# Patient Record
Sex: Female | Born: 1945
Health system: Southern US, Community
[De-identification: ages and names within clinical notes are randomized; demographics above are authoritative.]

## PROBLEM LIST (undated history)

## (undated) DIAGNOSIS — M1990 Unspecified osteoarthritis, unspecified site: Secondary | ICD-10-CM

## (undated) DIAGNOSIS — A059 Bacterial foodborne intoxication, unspecified: Secondary | ICD-10-CM

## (undated) DIAGNOSIS — T7840XA Allergy, unspecified, initial encounter: Secondary | ICD-10-CM

## (undated) DIAGNOSIS — E785 Hyperlipidemia, unspecified: Secondary | ICD-10-CM

## (undated) DIAGNOSIS — K635 Polyp of colon: Secondary | ICD-10-CM

## (undated) DIAGNOSIS — R011 Cardiac murmur, unspecified: Secondary | ICD-10-CM

## (undated) DIAGNOSIS — K5790 Diverticulosis of intestine, part unspecified, without perforation or abscess without bleeding: Secondary | ICD-10-CM

## (undated) DIAGNOSIS — M810 Age-related osteoporosis without current pathological fracture: Secondary | ICD-10-CM

## (undated) DIAGNOSIS — I10 Essential (primary) hypertension: Secondary | ICD-10-CM

## (undated) DIAGNOSIS — K8020 Calculus of gallbladder without cholecystitis without obstruction: Secondary | ICD-10-CM

## (undated) DIAGNOSIS — M3500 Sicca syndrome, unspecified: Secondary | ICD-10-CM

## (undated) DIAGNOSIS — K52839 Microscopic colitis, unspecified: Secondary | ICD-10-CM

## (undated) DIAGNOSIS — K219 Gastro-esophageal reflux disease without esophagitis: Secondary | ICD-10-CM

## (undated) DIAGNOSIS — G4730 Sleep apnea, unspecified: Secondary | ICD-10-CM

## (undated) DIAGNOSIS — J439 Emphysema, unspecified: Secondary | ICD-10-CM

## (undated) HISTORY — DX: Cardiac murmur, unspecified: R01.1

## (undated) HISTORY — DX: Microscopic colitis, unspecified: K52.839

## (undated) HISTORY — DX: Bacterial foodborne intoxication, unspecified: A05.9

## (undated) HISTORY — DX: Polyp of colon: K63.5

## (undated) HISTORY — DX: Gastro-esophageal reflux disease without esophagitis: K21.9

## (undated) HISTORY — PX: TONSILLECTOMY AND ADENOIDECTOMY: SUR1326

## (undated) HISTORY — DX: Hyperlipidemia, unspecified: E78.5

## (undated) HISTORY — DX: Sjogren syndrome, unspecified: M35.00

## (undated) HISTORY — DX: Unspecified osteoarthritis, unspecified site: M19.90

## (undated) HISTORY — PX: TUBAL LIGATION: SHX77

## (undated) HISTORY — DX: Diverticulosis of intestine, part unspecified, without perforation or abscess without bleeding: K57.90

## (undated) HISTORY — DX: Age-related osteoporosis without current pathological fracture: M81.0

## (undated) HISTORY — DX: Sleep apnea, unspecified: G47.30

## (undated) HISTORY — DX: Emphysema, unspecified: J43.9

## (undated) HISTORY — DX: Essential (primary) hypertension: I10

## (undated) HISTORY — DX: Allergy, unspecified, initial encounter: T78.40XA

## (undated) HISTORY — DX: Calculus of gallbladder without cholecystitis without obstruction: K80.20

---

## 2012-08-09 ENCOUNTER — Encounter: Payer: Self-pay | Admitting: Internal Medicine

## 2016-09-29 DIAGNOSIS — M06041 Rheumatoid arthritis without rheumatoid factor, right hand: Secondary | ICD-10-CM | POA: Diagnosis not present

## 2016-09-29 DIAGNOSIS — I10 Essential (primary) hypertension: Secondary | ICD-10-CM | POA: Diagnosis not present

## 2016-09-29 DIAGNOSIS — R0982 Postnasal drip: Secondary | ICD-10-CM | POA: Diagnosis not present

## 2016-09-29 DIAGNOSIS — R05 Cough: Secondary | ICD-10-CM | POA: Diagnosis not present

## 2016-09-29 DIAGNOSIS — K52831 Collagenous colitis: Secondary | ICD-10-CM | POA: Diagnosis not present

## 2016-09-29 DIAGNOSIS — M06042 Rheumatoid arthritis without rheumatoid factor, left hand: Secondary | ICD-10-CM | POA: Diagnosis not present

## 2016-10-10 DIAGNOSIS — R05 Cough: Secondary | ICD-10-CM | POA: Diagnosis not present

## 2016-10-10 DIAGNOSIS — G4733 Obstructive sleep apnea (adult) (pediatric): Secondary | ICD-10-CM | POA: Diagnosis not present

## 2016-10-10 DIAGNOSIS — F17201 Nicotine dependence, unspecified, in remission: Secondary | ICD-10-CM | POA: Diagnosis not present

## 2016-11-03 DIAGNOSIS — M06041 Rheumatoid arthritis without rheumatoid factor, right hand: Secondary | ICD-10-CM | POA: Diagnosis not present

## 2016-11-03 DIAGNOSIS — M06042 Rheumatoid arthritis without rheumatoid factor, left hand: Secondary | ICD-10-CM | POA: Diagnosis not present

## 2016-12-02 DIAGNOSIS — K52831 Collagenous colitis: Secondary | ICD-10-CM | POA: Diagnosis not present

## 2016-12-02 DIAGNOSIS — R911 Solitary pulmonary nodule: Secondary | ICD-10-CM | POA: Diagnosis not present

## 2016-12-02 DIAGNOSIS — I10 Essential (primary) hypertension: Secondary | ICD-10-CM | POA: Diagnosis not present

## 2016-12-02 DIAGNOSIS — R928 Other abnormal and inconclusive findings on diagnostic imaging of breast: Secondary | ICD-10-CM | POA: Diagnosis not present

## 2016-12-02 DIAGNOSIS — K760 Fatty (change of) liver, not elsewhere classified: Secondary | ICD-10-CM | POA: Diagnosis not present

## 2016-12-29 DIAGNOSIS — R499 Unspecified voice and resonance disorder: Secondary | ICD-10-CM | POA: Diagnosis not present

## 2017-01-02 DIAGNOSIS — M06041 Rheumatoid arthritis without rheumatoid factor, right hand: Secondary | ICD-10-CM | POA: Diagnosis not present

## 2017-01-02 DIAGNOSIS — M06042 Rheumatoid arthritis without rheumatoid factor, left hand: Secondary | ICD-10-CM | POA: Diagnosis not present

## 2017-01-05 DIAGNOSIS — R911 Solitary pulmonary nodule: Secondary | ICD-10-CM | POA: Diagnosis not present

## 2017-02-05 DIAGNOSIS — R928 Other abnormal and inconclusive findings on diagnostic imaging of breast: Secondary | ICD-10-CM | POA: Diagnosis not present

## 2017-03-23 DIAGNOSIS — M7061 Trochanteric bursitis, right hip: Secondary | ICD-10-CM | POA: Diagnosis not present

## 2017-04-27 DIAGNOSIS — I10 Essential (primary) hypertension: Secondary | ICD-10-CM | POA: Diagnosis not present

## 2017-04-27 DIAGNOSIS — G4733 Obstructive sleep apnea (adult) (pediatric): Secondary | ICD-10-CM | POA: Diagnosis not present

## 2017-04-27 DIAGNOSIS — Z9989 Dependence on other enabling machines and devices: Secondary | ICD-10-CM | POA: Diagnosis not present

## 2017-04-27 DIAGNOSIS — R05 Cough: Secondary | ICD-10-CM | POA: Diagnosis not present

## 2017-05-11 DIAGNOSIS — M9904 Segmental and somatic dysfunction of sacral region: Secondary | ICD-10-CM | POA: Diagnosis not present

## 2017-05-11 DIAGNOSIS — M9902 Segmental and somatic dysfunction of thoracic region: Secondary | ICD-10-CM | POA: Diagnosis not present

## 2017-05-11 DIAGNOSIS — M9903 Segmental and somatic dysfunction of lumbar region: Secondary | ICD-10-CM | POA: Diagnosis not present

## 2017-05-11 DIAGNOSIS — M5137 Other intervertebral disc degeneration, lumbosacral region: Secondary | ICD-10-CM | POA: Diagnosis not present

## 2017-05-11 DIAGNOSIS — M5136 Other intervertebral disc degeneration, lumbar region: Secondary | ICD-10-CM | POA: Diagnosis not present

## 2017-05-11 DIAGNOSIS — M5135 Other intervertebral disc degeneration, thoracolumbar region: Secondary | ICD-10-CM | POA: Diagnosis not present

## 2017-05-14 DIAGNOSIS — M9902 Segmental and somatic dysfunction of thoracic region: Secondary | ICD-10-CM | POA: Diagnosis not present

## 2017-05-14 DIAGNOSIS — M5136 Other intervertebral disc degeneration, lumbar region: Secondary | ICD-10-CM | POA: Diagnosis not present

## 2017-05-14 DIAGNOSIS — M9904 Segmental and somatic dysfunction of sacral region: Secondary | ICD-10-CM | POA: Diagnosis not present

## 2017-05-14 DIAGNOSIS — M9903 Segmental and somatic dysfunction of lumbar region: Secondary | ICD-10-CM | POA: Diagnosis not present

## 2017-05-14 DIAGNOSIS — M5137 Other intervertebral disc degeneration, lumbosacral region: Secondary | ICD-10-CM | POA: Diagnosis not present

## 2017-05-14 DIAGNOSIS — M5135 Other intervertebral disc degeneration, thoracolumbar region: Secondary | ICD-10-CM | POA: Diagnosis not present

## 2017-05-18 DIAGNOSIS — M9902 Segmental and somatic dysfunction of thoracic region: Secondary | ICD-10-CM | POA: Diagnosis not present

## 2017-05-18 DIAGNOSIS — M5136 Other intervertebral disc degeneration, lumbar region: Secondary | ICD-10-CM | POA: Diagnosis not present

## 2017-05-18 DIAGNOSIS — M5135 Other intervertebral disc degeneration, thoracolumbar region: Secondary | ICD-10-CM | POA: Diagnosis not present

## 2017-05-18 DIAGNOSIS — M9904 Segmental and somatic dysfunction of sacral region: Secondary | ICD-10-CM | POA: Diagnosis not present

## 2017-05-18 DIAGNOSIS — M9903 Segmental and somatic dysfunction of lumbar region: Secondary | ICD-10-CM | POA: Diagnosis not present

## 2017-05-18 DIAGNOSIS — M5137 Other intervertebral disc degeneration, lumbosacral region: Secondary | ICD-10-CM | POA: Diagnosis not present

## 2017-05-21 DIAGNOSIS — M9903 Segmental and somatic dysfunction of lumbar region: Secondary | ICD-10-CM | POA: Diagnosis not present

## 2017-05-21 DIAGNOSIS — M5137 Other intervertebral disc degeneration, lumbosacral region: Secondary | ICD-10-CM | POA: Diagnosis not present

## 2017-05-21 DIAGNOSIS — M5136 Other intervertebral disc degeneration, lumbar region: Secondary | ICD-10-CM | POA: Diagnosis not present

## 2017-05-21 DIAGNOSIS — M5135 Other intervertebral disc degeneration, thoracolumbar region: Secondary | ICD-10-CM | POA: Diagnosis not present

## 2017-05-21 DIAGNOSIS — M9904 Segmental and somatic dysfunction of sacral region: Secondary | ICD-10-CM | POA: Diagnosis not present

## 2017-05-21 DIAGNOSIS — M9902 Segmental and somatic dysfunction of thoracic region: Secondary | ICD-10-CM | POA: Diagnosis not present

## 2017-05-26 DIAGNOSIS — M9903 Segmental and somatic dysfunction of lumbar region: Secondary | ICD-10-CM | POA: Diagnosis not present

## 2017-05-26 DIAGNOSIS — M5135 Other intervertebral disc degeneration, thoracolumbar region: Secondary | ICD-10-CM | POA: Diagnosis not present

## 2017-05-26 DIAGNOSIS — M5137 Other intervertebral disc degeneration, lumbosacral region: Secondary | ICD-10-CM | POA: Diagnosis not present

## 2017-05-26 DIAGNOSIS — M9902 Segmental and somatic dysfunction of thoracic region: Secondary | ICD-10-CM | POA: Diagnosis not present

## 2017-05-26 DIAGNOSIS — M9904 Segmental and somatic dysfunction of sacral region: Secondary | ICD-10-CM | POA: Diagnosis not present

## 2017-05-26 DIAGNOSIS — M5136 Other intervertebral disc degeneration, lumbar region: Secondary | ICD-10-CM | POA: Diagnosis not present

## 2017-05-28 DIAGNOSIS — M9902 Segmental and somatic dysfunction of thoracic region: Secondary | ICD-10-CM | POA: Diagnosis not present

## 2017-05-28 DIAGNOSIS — M5136 Other intervertebral disc degeneration, lumbar region: Secondary | ICD-10-CM | POA: Diagnosis not present

## 2017-05-28 DIAGNOSIS — M5137 Other intervertebral disc degeneration, lumbosacral region: Secondary | ICD-10-CM | POA: Diagnosis not present

## 2017-05-28 DIAGNOSIS — M5135 Other intervertebral disc degeneration, thoracolumbar region: Secondary | ICD-10-CM | POA: Diagnosis not present

## 2017-05-28 DIAGNOSIS — M9903 Segmental and somatic dysfunction of lumbar region: Secondary | ICD-10-CM | POA: Diagnosis not present

## 2017-05-28 DIAGNOSIS — M9904 Segmental and somatic dysfunction of sacral region: Secondary | ICD-10-CM | POA: Diagnosis not present

## 2017-06-01 DIAGNOSIS — M06042 Rheumatoid arthritis without rheumatoid factor, left hand: Secondary | ICD-10-CM | POA: Diagnosis not present

## 2017-06-01 DIAGNOSIS — M06041 Rheumatoid arthritis without rheumatoid factor, right hand: Secondary | ICD-10-CM | POA: Diagnosis not present

## 2017-06-15 DIAGNOSIS — M5136 Other intervertebral disc degeneration, lumbar region: Secondary | ICD-10-CM | POA: Diagnosis not present

## 2017-06-15 DIAGNOSIS — M5137 Other intervertebral disc degeneration, lumbosacral region: Secondary | ICD-10-CM | POA: Diagnosis not present

## 2017-06-15 DIAGNOSIS — M9902 Segmental and somatic dysfunction of thoracic region: Secondary | ICD-10-CM | POA: Diagnosis not present

## 2017-06-15 DIAGNOSIS — M9904 Segmental and somatic dysfunction of sacral region: Secondary | ICD-10-CM | POA: Diagnosis not present

## 2017-06-15 DIAGNOSIS — M9903 Segmental and somatic dysfunction of lumbar region: Secondary | ICD-10-CM | POA: Diagnosis not present

## 2017-06-15 DIAGNOSIS — M5135 Other intervertebral disc degeneration, thoracolumbar region: Secondary | ICD-10-CM | POA: Diagnosis not present

## 2017-06-18 DIAGNOSIS — M5137 Other intervertebral disc degeneration, lumbosacral region: Secondary | ICD-10-CM | POA: Diagnosis not present

## 2017-06-18 DIAGNOSIS — M5135 Other intervertebral disc degeneration, thoracolumbar region: Secondary | ICD-10-CM | POA: Diagnosis not present

## 2017-06-18 DIAGNOSIS — M9902 Segmental and somatic dysfunction of thoracic region: Secondary | ICD-10-CM | POA: Diagnosis not present

## 2017-06-18 DIAGNOSIS — M9904 Segmental and somatic dysfunction of sacral region: Secondary | ICD-10-CM | POA: Diagnosis not present

## 2017-06-18 DIAGNOSIS — M5136 Other intervertebral disc degeneration, lumbar region: Secondary | ICD-10-CM | POA: Diagnosis not present

## 2017-06-18 DIAGNOSIS — M9903 Segmental and somatic dysfunction of lumbar region: Secondary | ICD-10-CM | POA: Diagnosis not present

## 2017-06-23 DIAGNOSIS — M9903 Segmental and somatic dysfunction of lumbar region: Secondary | ICD-10-CM | POA: Diagnosis not present

## 2017-06-23 DIAGNOSIS — M9902 Segmental and somatic dysfunction of thoracic region: Secondary | ICD-10-CM | POA: Diagnosis not present

## 2017-06-23 DIAGNOSIS — M9904 Segmental and somatic dysfunction of sacral region: Secondary | ICD-10-CM | POA: Diagnosis not present

## 2017-06-23 DIAGNOSIS — M5137 Other intervertebral disc degeneration, lumbosacral region: Secondary | ICD-10-CM | POA: Diagnosis not present

## 2017-06-23 DIAGNOSIS — M5136 Other intervertebral disc degeneration, lumbar region: Secondary | ICD-10-CM | POA: Diagnosis not present

## 2017-06-23 DIAGNOSIS — M5135 Other intervertebral disc degeneration, thoracolumbar region: Secondary | ICD-10-CM | POA: Diagnosis not present

## 2017-06-25 DIAGNOSIS — M5136 Other intervertebral disc degeneration, lumbar region: Secondary | ICD-10-CM | POA: Diagnosis not present

## 2017-06-25 DIAGNOSIS — M9903 Segmental and somatic dysfunction of lumbar region: Secondary | ICD-10-CM | POA: Diagnosis not present

## 2017-06-25 DIAGNOSIS — M9904 Segmental and somatic dysfunction of sacral region: Secondary | ICD-10-CM | POA: Diagnosis not present

## 2017-06-25 DIAGNOSIS — M5137 Other intervertebral disc degeneration, lumbosacral region: Secondary | ICD-10-CM | POA: Diagnosis not present

## 2017-06-25 DIAGNOSIS — M9902 Segmental and somatic dysfunction of thoracic region: Secondary | ICD-10-CM | POA: Diagnosis not present

## 2017-06-25 DIAGNOSIS — M5135 Other intervertebral disc degeneration, thoracolumbar region: Secondary | ICD-10-CM | POA: Diagnosis not present

## 2017-07-15 DIAGNOSIS — Z23 Encounter for immunization: Secondary | ICD-10-CM | POA: Diagnosis not present

## 2017-07-15 DIAGNOSIS — R911 Solitary pulmonary nodule: Secondary | ICD-10-CM | POA: Diagnosis not present

## 2017-07-15 DIAGNOSIS — G4733 Obstructive sleep apnea (adult) (pediatric): Secondary | ICD-10-CM | POA: Diagnosis not present

## 2017-07-15 DIAGNOSIS — J329 Chronic sinusitis, unspecified: Secondary | ICD-10-CM | POA: Diagnosis not present

## 2017-08-10 DIAGNOSIS — Z1231 Encounter for screening mammogram for malignant neoplasm of breast: Secondary | ICD-10-CM | POA: Diagnosis not present

## 2017-08-10 DIAGNOSIS — M7060 Trochanteric bursitis, unspecified hip: Secondary | ICD-10-CM | POA: Diagnosis not present

## 2017-08-10 DIAGNOSIS — M06041 Rheumatoid arthritis without rheumatoid factor, right hand: Secondary | ICD-10-CM | POA: Diagnosis not present

## 2017-08-10 DIAGNOSIS — M3500 Sicca syndrome, unspecified: Secondary | ICD-10-CM | POA: Diagnosis not present

## 2017-09-08 DIAGNOSIS — R05 Cough: Secondary | ICD-10-CM | POA: Diagnosis not present

## 2017-09-08 DIAGNOSIS — R911 Solitary pulmonary nodule: Secondary | ICD-10-CM | POA: Diagnosis not present

## 2017-09-08 LAB — PULMONARY FUNCTION TEST

## 2017-10-15 DIAGNOSIS — K219 Gastro-esophageal reflux disease without esophagitis: Secondary | ICD-10-CM | POA: Diagnosis not present

## 2017-10-19 DIAGNOSIS — K210 Gastro-esophageal reflux disease with esophagitis: Secondary | ICD-10-CM | POA: Diagnosis not present

## 2017-10-19 DIAGNOSIS — K2960 Other gastritis without bleeding: Secondary | ICD-10-CM | POA: Diagnosis not present

## 2017-10-22 DIAGNOSIS — Z87891 Personal history of nicotine dependence: Secondary | ICD-10-CM | POA: Diagnosis not present

## 2017-10-22 DIAGNOSIS — G4733 Obstructive sleep apnea (adult) (pediatric): Secondary | ICD-10-CM | POA: Diagnosis not present

## 2017-10-22 DIAGNOSIS — R911 Solitary pulmonary nodule: Secondary | ICD-10-CM | POA: Diagnosis not present

## 2017-10-26 DIAGNOSIS — L821 Other seborrheic keratosis: Secondary | ICD-10-CM | POA: Diagnosis not present

## 2017-10-26 DIAGNOSIS — B079 Viral wart, unspecified: Secondary | ICD-10-CM | POA: Diagnosis not present

## 2017-10-26 DIAGNOSIS — L720 Epidermal cyst: Secondary | ICD-10-CM | POA: Diagnosis not present

## 2017-11-09 DIAGNOSIS — Z9989 Dependence on other enabling machines and devices: Secondary | ICD-10-CM | POA: Diagnosis not present

## 2017-11-09 DIAGNOSIS — K449 Diaphragmatic hernia without obstruction or gangrene: Secondary | ICD-10-CM | POA: Diagnosis not present

## 2017-11-09 DIAGNOSIS — I10 Essential (primary) hypertension: Secondary | ICD-10-CM | POA: Diagnosis not present

## 2017-11-09 DIAGNOSIS — G4733 Obstructive sleep apnea (adult) (pediatric): Secondary | ICD-10-CM | POA: Diagnosis not present

## 2017-11-10 DIAGNOSIS — R5383 Other fatigue: Secondary | ICD-10-CM | POA: Diagnosis not present

## 2017-11-10 DIAGNOSIS — M06041 Rheumatoid arthritis without rheumatoid factor, right hand: Secondary | ICD-10-CM | POA: Diagnosis not present

## 2017-11-10 DIAGNOSIS — M06042 Rheumatoid arthritis without rheumatoid factor, left hand: Secondary | ICD-10-CM | POA: Diagnosis not present

## 2017-11-10 DIAGNOSIS — I10 Essential (primary) hypertension: Secondary | ICD-10-CM | POA: Diagnosis not present

## 2017-11-23 DIAGNOSIS — L720 Epidermal cyst: Secondary | ICD-10-CM | POA: Diagnosis not present

## 2017-11-23 DIAGNOSIS — B079 Viral wart, unspecified: Secondary | ICD-10-CM | POA: Diagnosis not present

## 2017-12-15 DIAGNOSIS — L720 Epidermal cyst: Secondary | ICD-10-CM | POA: Diagnosis not present

## 2017-12-15 DIAGNOSIS — B079 Viral wart, unspecified: Secondary | ICD-10-CM | POA: Diagnosis not present

## 2017-12-15 DIAGNOSIS — D692 Other nonthrombocytopenic purpura: Secondary | ICD-10-CM | POA: Diagnosis not present

## 2017-12-21 DIAGNOSIS — R5383 Other fatigue: Secondary | ICD-10-CM | POA: Diagnosis not present

## 2017-12-21 DIAGNOSIS — M06041 Rheumatoid arthritis without rheumatoid factor, right hand: Secondary | ICD-10-CM | POA: Diagnosis not present

## 2017-12-21 DIAGNOSIS — M06042 Rheumatoid arthritis without rheumatoid factor, left hand: Secondary | ICD-10-CM | POA: Diagnosis not present

## 2018-01-07 ENCOUNTER — Telehealth: Payer: Self-pay | Admitting: Gastroenterology

## 2018-01-07 NOTE — Telephone Encounter (Signed)
Patient has moved from [place] to [place] and would like to establish with our practice because she has colitis. Dr. Loletha Carrow is Doc of the Day for 01/07/18 PM. Records placed on his desk for review.

## 2018-01-09 ENCOUNTER — Encounter: Payer: Self-pay | Admitting: Internal Medicine

## 2018-01-11 DIAGNOSIS — H04123 Dry eye syndrome of bilateral lacrimal glands: Secondary | ICD-10-CM | POA: Diagnosis not present

## 2018-01-11 DIAGNOSIS — H2513 Age-related nuclear cataract, bilateral: Secondary | ICD-10-CM | POA: Diagnosis not present

## 2018-01-15 DIAGNOSIS — L723 Sebaceous cyst: Secondary | ICD-10-CM | POA: Diagnosis not present

## 2018-01-15 DIAGNOSIS — Z801 Family history of malignant neoplasm of trachea, bronchus and lung: Secondary | ICD-10-CM | POA: Diagnosis not present

## 2018-01-15 DIAGNOSIS — Z800 Family history of malignant neoplasm of digestive organs: Secondary | ICD-10-CM | POA: Diagnosis not present

## 2018-01-15 DIAGNOSIS — Z1509 Genetic susceptibility to other malignant neoplasm: Secondary | ICD-10-CM | POA: Diagnosis not present

## 2018-01-18 DIAGNOSIS — G4733 Obstructive sleep apnea (adult) (pediatric): Secondary | ICD-10-CM | POA: Diagnosis not present

## 2018-01-18 DIAGNOSIS — R911 Solitary pulmonary nodule: Secondary | ICD-10-CM | POA: Diagnosis not present

## 2018-01-18 DIAGNOSIS — M06041 Rheumatoid arthritis without rheumatoid factor, right hand: Secondary | ICD-10-CM | POA: Diagnosis not present

## 2018-01-20 ENCOUNTER — Encounter: Payer: Self-pay | Admitting: Gastroenterology

## 2018-01-20 NOTE — Telephone Encounter (Signed)
I reviewed her records and see that she has microscopic colitis. Last colonoscopy 2017.  I would be glad to assume her care, and will go over her records in detail with her the day of a visit.  Please arrange a new patient appointment with me. - HD

## 2018-01-20 NOTE — Telephone Encounter (Signed)
New patient appointment scheduled for 04/20/18

## 2018-02-08 DIAGNOSIS — M5137 Other intervertebral disc degeneration, lumbosacral region: Secondary | ICD-10-CM | POA: Diagnosis not present

## 2018-02-08 DIAGNOSIS — M9903 Segmental and somatic dysfunction of lumbar region: Secondary | ICD-10-CM | POA: Diagnosis not present

## 2018-02-08 DIAGNOSIS — M9902 Segmental and somatic dysfunction of thoracic region: Secondary | ICD-10-CM | POA: Diagnosis not present

## 2018-02-08 DIAGNOSIS — M9904 Segmental and somatic dysfunction of sacral region: Secondary | ICD-10-CM | POA: Diagnosis not present

## 2018-02-08 DIAGNOSIS — M5135 Other intervertebral disc degeneration, thoracolumbar region: Secondary | ICD-10-CM | POA: Diagnosis not present

## 2018-02-08 DIAGNOSIS — M5136 Other intervertebral disc degeneration, lumbar region: Secondary | ICD-10-CM | POA: Diagnosis not present

## 2018-02-09 DIAGNOSIS — M9904 Segmental and somatic dysfunction of sacral region: Secondary | ICD-10-CM | POA: Diagnosis not present

## 2018-02-09 DIAGNOSIS — M5137 Other intervertebral disc degeneration, lumbosacral region: Secondary | ICD-10-CM | POA: Diagnosis not present

## 2018-02-09 DIAGNOSIS — M5135 Other intervertebral disc degeneration, thoracolumbar region: Secondary | ICD-10-CM | POA: Diagnosis not present

## 2018-02-09 DIAGNOSIS — M9903 Segmental and somatic dysfunction of lumbar region: Secondary | ICD-10-CM | POA: Diagnosis not present

## 2018-02-09 DIAGNOSIS — M9902 Segmental and somatic dysfunction of thoracic region: Secondary | ICD-10-CM | POA: Diagnosis not present

## 2018-02-09 DIAGNOSIS — M5136 Other intervertebral disc degeneration, lumbar region: Secondary | ICD-10-CM | POA: Diagnosis not present

## 2018-02-12 DIAGNOSIS — M5137 Other intervertebral disc degeneration, lumbosacral region: Secondary | ICD-10-CM | POA: Diagnosis not present

## 2018-02-12 DIAGNOSIS — M9904 Segmental and somatic dysfunction of sacral region: Secondary | ICD-10-CM | POA: Diagnosis not present

## 2018-02-12 DIAGNOSIS — M5136 Other intervertebral disc degeneration, lumbar region: Secondary | ICD-10-CM | POA: Diagnosis not present

## 2018-02-12 DIAGNOSIS — M9902 Segmental and somatic dysfunction of thoracic region: Secondary | ICD-10-CM | POA: Diagnosis not present

## 2018-02-12 DIAGNOSIS — M5135 Other intervertebral disc degeneration, thoracolumbar region: Secondary | ICD-10-CM | POA: Diagnosis not present

## 2018-02-12 DIAGNOSIS — M9903 Segmental and somatic dysfunction of lumbar region: Secondary | ICD-10-CM | POA: Diagnosis not present

## 2018-02-13 DIAGNOSIS — M9902 Segmental and somatic dysfunction of thoracic region: Secondary | ICD-10-CM | POA: Diagnosis not present

## 2018-02-13 DIAGNOSIS — M5136 Other intervertebral disc degeneration, lumbar region: Secondary | ICD-10-CM | POA: Diagnosis not present

## 2018-02-13 DIAGNOSIS — M9903 Segmental and somatic dysfunction of lumbar region: Secondary | ICD-10-CM | POA: Diagnosis not present

## 2018-02-13 DIAGNOSIS — M9904 Segmental and somatic dysfunction of sacral region: Secondary | ICD-10-CM | POA: Diagnosis not present

## 2018-02-13 DIAGNOSIS — M5137 Other intervertebral disc degeneration, lumbosacral region: Secondary | ICD-10-CM | POA: Diagnosis not present

## 2018-02-13 DIAGNOSIS — M5135 Other intervertebral disc degeneration, thoracolumbar region: Secondary | ICD-10-CM | POA: Diagnosis not present

## 2018-04-12 DIAGNOSIS — R5383 Other fatigue: Secondary | ICD-10-CM | POA: Diagnosis not present

## 2018-04-12 DIAGNOSIS — M0609 Rheumatoid arthritis without rheumatoid factor, multiple sites: Secondary | ICD-10-CM | POA: Diagnosis not present

## 2018-04-12 DIAGNOSIS — M3500 Sicca syndrome, unspecified: Secondary | ICD-10-CM | POA: Diagnosis not present

## 2018-04-12 DIAGNOSIS — Z6834 Body mass index (BMI) 34.0-34.9, adult: Secondary | ICD-10-CM | POA: Diagnosis not present

## 2018-04-12 DIAGNOSIS — M2550 Pain in unspecified joint: Secondary | ICD-10-CM | POA: Diagnosis not present

## 2018-04-12 DIAGNOSIS — E669 Obesity, unspecified: Secondary | ICD-10-CM | POA: Diagnosis not present

## 2018-04-12 DIAGNOSIS — M150 Primary generalized (osteo)arthritis: Secondary | ICD-10-CM | POA: Diagnosis not present

## 2018-04-15 ENCOUNTER — Ambulatory Visit (INDEPENDENT_AMBULATORY_CARE_PROVIDER_SITE_OTHER): Payer: Medicare Other | Admitting: Internal Medicine

## 2018-04-15 ENCOUNTER — Encounter: Payer: Self-pay | Admitting: Internal Medicine

## 2018-04-15 VITALS — BP 130/78 | HR 85 | Ht 65.0 in | Wt 205.4 lb

## 2018-04-15 DIAGNOSIS — R059 Cough, unspecified: Secondary | ICD-10-CM

## 2018-04-15 DIAGNOSIS — IMO0001 Reserved for inherently not codable concepts without codable children: Secondary | ICD-10-CM

## 2018-04-15 DIAGNOSIS — G4733 Obstructive sleep apnea (adult) (pediatric): Secondary | ICD-10-CM | POA: Diagnosis not present

## 2018-04-15 DIAGNOSIS — R05 Cough: Secondary | ICD-10-CM

## 2018-04-15 DIAGNOSIS — R911 Solitary pulmonary nodule: Secondary | ICD-10-CM | POA: Diagnosis not present

## 2018-04-15 NOTE — Progress Notes (Signed)
04/15/2018-72 year old female widow former smoker for sleep evaluation. Sleep Consult: Self referral-pt had sleep study about 5 years ago and set up with CPAP-has used since then. Needs to establish with local DME instead of  one in [place], [place]. Medical problem list includes arthritis, GERD, HBP, Scleroderma,Sjogren's disease, allergic rhinitis, lung nodule, insomnia NPSG 11/ 2013- AHI 6.4/ hr.  CPAP 7/ Download 90% compliance, AHI 4/ hr. Using S9 [employer] Problem: Obstructive sleep apnea-using a full facemask and describes good compliance and control.  Needs to establish local DME.  Not sure how old this machine is but it seems to be functioning.  She also has travel machine. Problem: Lung nodule CT chest from [place], [place],  [place]-01/18/2018-comparison back to 01/05/2017 describes stable benign nodule right lung, few chronic markings right middle lobe, incidental cholelithiasis, no acute findings and no interval change. Problem: Cough.  She reports  scleroderma and GERD, Sjogren's.  Her CT report does not indicate significant interstitial lung disease or active process as noted above.  She says the dry cough is better now than it had been.  She sleeps with head of bed elevated 7 inches.  Records include negative environmental allergen profile without significantly elevated eosinophils.  Prior to Admission medications   Medication Sig Start Date End Date Taking? Authorizing Provider  bismuth subsalicylate (PEPTO BISMOL) 262 MG/15ML suspension Take 30 mLs by mouth every 6 (six) hours as needed.   Yes [provider]  budesonide (ENTOCORT EC) 3 MG 24 hr capsule Take 1 capsule by mouth daily. 04/01/18  Yes [provider]  calcium citrate-vitamin D (CITRACAL+D) 315-200 MG-UNIT tablet Take 1 tablet by mouth daily.   Yes [provider]  DEXILANT 60 MG capsule Take 1 capsule by mouth daily. 03/23/18  Yes [provider]  folic acid (FOLVITE) 053 MCG tablet Take 800 mcg  by mouth daily.   Yes [provider]  HUMIRA 40 MG/0.4ML PSKT Inject 1 Syringe as directed every 14 (fourteen) days. 03/31/18  Yes [provider]  ipratropium (ATROVENT) 0.03 % nasal spray Place 2 sprays into both nostrils every 12 (twelve) hours.   Yes [provider]  loratadine (CLARITIN) 10 MG tablet Take 10 mg by mouth 2 (two) times daily.   Yes [provider]  losartan-hydrochlorothiazide (HYZAAR) 50-12.5 MG tablet Take 1 tablet by mouth daily. 03/16/18  Yes [provider]  Multiple Vitamins-Iron (MULTIVITAMIN/IRON PO) Take 1 tablet by mouth daily.   Yes [provider]  OVER THE COUNTER MEDICATION Epimedium 200 mg   Yes [provider]  OVER THE COUNTER MEDICATION Fleeceflower 70 mg   Yes [provider]  RESTASIS 0.05 % ophthalmic emulsion 1 drop BID 01/16/18  Yes [provider]  ROZEREM 8 MG tablet Take 1 tablet by mouth at bedtime as needed. 01/21/18  Yes [provider]  zaleplon (SONATA) 5 MG capsule Take 5 mg by mouth at bedtime as needed for sleep.   Yes [provider]  budesonide (ENTOCORT EC) 3 MG 24 hr capsule Take 1 capsule (3 mg total) by mouth daily. 04/20/18   Doran Stabler, MD   Past Medical History:  Diagnosis Date  . Arthritis   . Cholecystolithiasis   . Colon polyps   . Diverticulosis   . GERD (gastroesophageal reflux disease)   . High blood pressure   . Microscopic colitis   . Sjogren's disease ([place])   . Sleep apnea    Past Surgical History:  Procedure Laterality Date  .  TONSILLECTOMY AND ADENOIDECTOMY    . TUBAL LIGATION     Family History  Problem Relation Age of Onset  . Lung cancer Mother   . Colon cancer Mother    Social History   Socioeconomic History  . Marital status: Widowed    Spouse name: Not on file  . Number of children: 1  . Years of education: Not on file  . Highest education level: Not on file  Occupational History  . Occupation:  retired  Scientific laboratory technician  . Financial resource strain: Not on file  . Food insecurity:    Worry: Not on file    Inability: Not on file  . Transportation needs:    Medical: Not on file    Non-medical: Not on file  Tobacco Use  . Smoking status: Former Research scientist (life sciences)  . Smokeless tobacco: Never Used  Substance and Sexual Activity  . Alcohol use: Yes  . Drug use: Never  . Sexual activity: Not Currently    Partners: Male  Lifestyle  . Physical activity:    Days per week: Not on file    Minutes per session: Not on file  . Stress: Not on file  Relationships  . Social connections:    Talks on phone: Not on file    Gets together: Not on file    Attends religious service: Not on file    Active member of club or organization: Not on file    Attends meetings of clubs or organizations: Not on file    Relationship status: Not on file  . Intimate partner violence:    Fear of current or ex partner: Not on file    Emotionally abused: Not on file    Physically abused: Not on file    Forced sexual activity: Not on file  Other Topics Concern  . Not on file  Social History Narrative  . Not on file   ROS-see HPI   sign = positive Constitutional:    weight loss, night sweats, fevers, chills, fatigue, lassitude. HEENT:    headaches, difficulty swallowing, tooth/dental problems, sore throat,       sneezing, itching, ear ache, +nasal congestion, post nasal drip, snoring CV:    chest pain, orthopnea, PND, swelling in lower extremities, anasarca,                                  dizziness, palpitations Resp:   shortness of breath with exertion or at rest.                productive cough,  + non-productive cough, coughing up of blood.              change in color of mucus.  wheezing.   Skin:    rash or lesions. GI:  + heartburn, indigestion, abdominal pain, nausea, vomiting, diarrhea,                 change in bowel habits, loss of appetite GU: dysuria, change in color of urine, no urgency or frequency.    flank pain. MS:   joint pain, stiffness, decreased range of motion, back pain. Neuro-     nothing unusual Psych:  change in mood or affect.  depression or anxiety.   memory loss.  OBJ- Physical Exam General- Alert, Oriented, Affect-appropriate, Distress- none acute + overweight Skin- rash-none, lesions- none, excoriation- none Lymphadenopathy- none Head- atraumatic  Eyes- Gross vision intact, PERRLA, conjunctivae and secretions clear            Ears- Hearing, canals-normal            Nose-sign turbinate edema, no-Septal dev, mucus, polyps, erosion, perforation             Throat- Mallampati III-IV , mucosa clear , drainage- none, tonsils- absent + throat clearing Neck- flexible , trachea midline, no stridor , thyroid nl, carotid no bruit Chest - symmetrical excursion , unlabored           Heart/CV- RRR , no murmur , no gallop  , no rub, nl s1 s2                           - JVD- none , edema- none, stasis changes- none, varices- none           Lung- clear to P&A, wheeze- none, cough- none , dullness-none, rub- none           Chest wall-  Abd-  Br/ Gen/ Rectal- Not done, not indicated Extrem- cyanosis- none, clubbing, none, atrophy- none, strength- nl Neuro- grossly intact to observation

## 2018-04-15 NOTE — Patient Instructions (Addendum)
Order- establish local DME to continue CPAP 7, mask of choice, humidifier supplies, AirView             Compliance has been good with no break in therapy   Please call as needed

## 2018-04-20 ENCOUNTER — Telehealth: Payer: Self-pay

## 2018-04-20 ENCOUNTER — Ambulatory Visit (INDEPENDENT_AMBULATORY_CARE_PROVIDER_SITE_OTHER): Payer: Medicare Other | Admitting: Gastroenterology

## 2018-04-20 ENCOUNTER — Encounter: Payer: Self-pay | Admitting: Gastroenterology

## 2018-04-20 VITALS — BP 140/78 | HR 72 | Ht 65.0 in | Wt 206.0 lb

## 2018-04-20 DIAGNOSIS — K52831 Collagenous colitis: Secondary | ICD-10-CM | POA: Diagnosis not present

## 2018-04-20 DIAGNOSIS — K529 Noninfective gastroenteritis and colitis, unspecified: Secondary | ICD-10-CM

## 2018-04-20 DIAGNOSIS — Z800 Family history of malignant neoplasm of digestive organs: Secondary | ICD-10-CM | POA: Diagnosis not present

## 2018-04-20 MED ORDER — BUDESONIDE 3 MG PO CPEP: 3.0000 mg | ORAL_CAPSULE | Freq: Every day | ORAL | 1 refills | Status: DC

## 2018-04-20 NOTE — Telephone Encounter (Signed)
Recall colonoscopy placed for 01-2021

## 2018-04-20 NOTE — Progress Notes (Signed)
[facility] Consult Note:  History: Loretta Austin 04/20/2018  Referring physician: Pa, [facility]  Reason for consult/chief complaint: Establish Care (Family hx of colon cancer; hx of colagenous colitis)   Subjective  HPI:  This is a very pleasant 72 year old woman referred by primary care to establish GI care for collagenous colitis and a family history of colon cancer.   Prior records were reviewed indicating the following: Office visit with Dr. Abigail Austin for chronic diarrhea Loretta Austin of [facility] physicians on 12/02/2016 and microscopic colitis.  At that time, she was on Pepto-Bismol, 8 tablets a day and budesonide 3 mg a day. Labs from that day, normal CBC, ESR 8, CRP less than 5, normal CMP Office note with same physician 10/15/2017, on same therapy for microscopic colitis.  Also noted tubular adenoma from duodenum "several years ago" and not yet had endoscopic follow-up.  Patient also complained of postnasal drip and throat discomfort despite twice daily PPI and ranitidine.,  She was then switched to [facility]line. Upper endoscopy on 10/19/2017 showing a gastric remnant patch proximal esophagus, slightly tortuous distal esophagus with mild distal esophagitis.  Mild diffuse gastric mucosal erythema and retained bile.  There was a scar in the descending duodenum from previous polypectomy, no residual polypoid tissue.  No further endoscopic surveillance was felt to be necessary. Colonoscopy 02/12/2016 normal ileum.  Normal colon except for left-sided diverticulosis.  The patient was already noted to have collagenous colitis at that time.  Next colonoscopy was recommended a 5-year interval due to family history of colon cancer.:  Biopsy 02/12/2016 (while on therapy for microscopic colitis) showed colonic mucosa with mild chronic inflammation and focal equivocal increase of collagen. Also included with the records is an ultrasound of the right breast because of  her previous abnormal mammogram.  This study showed a well-circumscribed complex right breast cyst.  Loretta Austin states that when she was first diagnosed, she was put on Pepto-Bismol alone.  Notes indicate this was not sufficient to control the symptoms, especially when an attempt was made to taper it. At this point, she seems to be doing well with current regimen of 8 Pepto-Bismol tablets taken over the course of 3 daily doses and one 3 mg Entocort tablet daily.  She has on average two soft stools per day.  There is no rectal bleeding.  Her appetite is good and weight stable.  Stool is always black on the Pepto.  Lastly, she has symptoms of GERD with heartburn and regurgitation that she feels are under better control with Dexilant and they have been with twice daily Protonix.  In addition, it is easier to take this medicine since it is once daily.  She also had a custom-made mattress to elevate her head of bed, and this is also helped with her allergic postnasal drip causing a chronic throat discomfort.  ROS:  Review of Systems  Constitutional: Positive for fatigue. Negative for appetite change and unexpected weight change.  HENT: Negative for mouth sores and voice change.   Eyes: Negative for pain and redness.  Respiratory: Negative for cough and shortness of breath.   Cardiovascular: Negative for chest pain and palpitations.  Genitourinary: Positive for frequency. Negative for dysuria and hematuria.  Musculoskeletal: Positive for arthralgias. Negative for myalgias.  Skin: Negative for pallor and rash.  Allergic/Immunologic: Positive for environmental allergies.  Neurological: Negative for weakness and headaches.  Hematological: Negative for adenopathy.     Past Medical History: Past Medical History:  Diagnosis Date  .  Arthritis   . Cholecystolithiasis   . Colon polyps   . Diverticulosis   . GERD (gastroesophageal reflux disease)   . High blood pressure   . Microscopic colitis   .  Sjogren's disease ([place])   . Sleep apnea      Past Surgical History: Past Surgical History:  Procedure Laterality Date  . TONSILLECTOMY AND ADENOIDECTOMY    . TUBAL LIGATION       Family History: Family History  Problem Relation Age of Onset  . Lung cancer Mother   . Colon cancer Mother     Social History: Social History   Socioeconomic History  . Marital status: Widowed    Spouse name: Not on file  . Number of children: 1  . Years of education: Not on file  . Highest education level: Not on file  Occupational History  . Occupation: retired  Scientific laboratory technician  . Financial resource strain: Not on file  . Food insecurity:    Worry: Not on file    Inability: Not on file  . Transportation needs:    Medical: Not on file    Non-medical: Not on file  Tobacco Use  . Smoking status: Former Research scientist (life sciences)  . Smokeless tobacco: Never Used  Substance and Sexual Activity  . Alcohol use: Yes  . Drug use: Never  . Sexual activity: Not Currently    Partners: Male  Lifestyle  . Physical activity:    Days per week: Not on file    Minutes per session: Not on file  . Stress: Not on file  Relationships  . Social connections:    Talks on phone: Not on file    Gets together: Not on file    Attends religious service: Not on file    Active member of club or organization: Not on file    Attends meetings of clubs or organizations: Not on file    Relationship status: Not on file  Other Topics Concern  . Not on file  Social History Narrative  . Not on file    Allergies: Allergies  Allergen Reactions  . Ciprofloxacin   . Flagyl [Metronidazole]     Outpatient Meds: Current Outpatient Medications  Medication Sig Dispense Refill  . bismuth subsalicylate (PEPTO BISMOL) 262 MG/15ML suspension Take 30 mLs by mouth every 6 (six) hours as needed.    . budesonide (ENTOCORT EC) 3 MG 24 hr capsule Take 1 capsule by mouth daily.    . calcium citrate-vitamin D (CITRACAL+D) 315-200 MG-UNIT tablet  Take 1 tablet by mouth daily.    Marland Kitchen DEXILANT 60 MG capsule Take 1 capsule by mouth daily.    . folic acid (FOLVITE) 371 MCG tablet Take 800 mcg by mouth daily.    Marland Kitchen HUMIRA 40 MG/0.4ML PSKT Inject 1 Syringe as directed every 14 (fourteen) days.    Marland Kitchen ipratropium (ATROVENT) 0.03 % nasal spray Place 2 sprays into both nostrils every 12 (twelve) hours.    Marland Kitchen loratadine (CLARITIN) 10 MG tablet Take 10 mg by mouth 2 (two) times daily.    Marland Kitchen losartan-hydrochlorothiazide (HYZAAR) 50-12.5 MG tablet Take 1 tablet by mouth daily.    . Multiple Vitamins-Iron (MULTIVITAMIN/IRON PO) Take 1 tablet by mouth daily.    Marland Kitchen OVER THE COUNTER MEDICATION Epimedium 200 mg    . OVER THE COUNTER MEDICATION Fleeceflower 70 mg    . RESTASIS 0.05 % ophthalmic emulsion 1 drop BID    . ROZEREM 8 MG tablet Take 1 tablet by mouth at bedtime  as needed.    . zaleplon (SONATA) 5 MG capsule Take 5 mg by mouth at bedtime as needed for sleep.     No current facility-administered medications for this visit.       ___________________________________________________________________ Objective   Exam:  BP 140/78   Pulse 72   Ht 5' 5"  (1.651 m)   Wt 206 lb (93.4 kg)   BMI 34.28 kg/m    General: this is a(n) well-appearing woman, normal vocal quality  Eyes: sclera anicteric, no redness  ENT: oral mucosa moist without lesions, no cervical or supraclavicular lymphadenopathy, good dentition  CV: RRR without murmur, S1/S2, no JVD, no peripheral edema  Resp: clear to auscultation bilaterally, normal RR and effort noted  GI: soft, no tenderness, with active bowel sounds. No guarding or palpable organomegaly noted.  Skin; warm and dry, no rash or jaundice noted  Neuro: awake, alert and oriented x 3. Normal gross motor function and fluent speech  Prior records as noted above   Assessment: Encounter Diagnoses  Name Primary?  . Collagenous colitis Yes  . Family history of colon cancer in mother   . Chronic diarrhea      Symptoms doing well on stable regimen of medicines.  However, this regimen seems cumbersome.  Studies in general experience shows that budesonide tends to get better control than Pepto-Bismol, so I am not sure she is getting much benefit of taking both.  I recommended slowly tapering off the Pepto-Bismol, which she seems inclined to do over the course of several weeks.  She will contact me with questions or problems, and I refilled the budesonide at current dose of 3 mg once daily.  We also put her in our recall system for colonoscopy due to family history of colon cancer for May 2022   Thank you for the courtesy of this consult.  Please call me with any questions or concerns.  Nelida Meuse III  CC: Pa, [employer]

## 2018-04-20 NOTE — Patient Instructions (Addendum)
If you are age 71 or older, your body mass index should be between 23-30. Your Body mass index is 34.28 kg/m. If this is out of the aforementioned range listed, please consider follow up with your Primary Care Provider.  If you are age 25 or younger, your body mass index should be between 19-25. Your Body mass index is 34.28 kg/m. If this is out of the aformentioned range listed, please consider follow up with your Primary Care Provider.   Follow pu in 6 months.  It was a pleasure to meet you today!  Dr. Loletha Carrow

## 2018-04-22 ENCOUNTER — Telehealth: Payer: Self-pay | Admitting: Internal Medicine

## 2018-04-22 NOTE — Telephone Encounter (Signed)
Spoke with pt in the lobby. She came by to sign a records release form so that we may request her sleep study. Sharl Ma has faxed this form and will give to [employer] to hold on to until we receive this report. Will route message for follow up to [facility].

## 2018-04-23 DIAGNOSIS — I10 Essential (primary) hypertension: Secondary | ICD-10-CM | POA: Diagnosis not present

## 2018-04-23 DIAGNOSIS — Z6834 Body mass index (BMI) 34.0-34.9, adult: Secondary | ICD-10-CM | POA: Diagnosis not present

## 2018-04-23 DIAGNOSIS — Z1389 Encounter for screening for other disorder: Secondary | ICD-10-CM | POA: Diagnosis not present

## 2018-04-23 DIAGNOSIS — M069 Rheumatoid arthritis, unspecified: Secondary | ICD-10-CM | POA: Diagnosis not present

## 2018-04-23 DIAGNOSIS — K219 Gastro-esophageal reflux disease without esophagitis: Secondary | ICD-10-CM | POA: Diagnosis not present

## 2018-04-23 DIAGNOSIS — G4733 Obstructive sleep apnea (adult) (pediatric): Secondary | ICD-10-CM | POA: Diagnosis not present

## 2018-04-23 DIAGNOSIS — K52831 Collagenous colitis: Secondary | ICD-10-CM | POA: Diagnosis not present

## 2018-04-23 DIAGNOSIS — M545 Low back pain: Secondary | ICD-10-CM | POA: Diagnosis not present

## 2018-04-25 DIAGNOSIS — R911 Solitary pulmonary nodule: Secondary | ICD-10-CM | POA: Insufficient documentation

## 2018-04-25 DIAGNOSIS — R059 Cough, unspecified: Secondary | ICD-10-CM | POA: Insufficient documentation

## 2018-04-25 DIAGNOSIS — R918 Other nonspecific abnormal finding of lung field: Secondary | ICD-10-CM | POA: Insufficient documentation

## 2018-04-25 DIAGNOSIS — R05 Cough: Secondary | ICD-10-CM | POA: Insufficient documentation

## 2018-04-25 DIAGNOSIS — IMO0001 Reserved for inherently not codable concepts without codable children: Secondary | ICD-10-CM | POA: Insufficient documentation

## 2018-04-25 DIAGNOSIS — G4733 Obstructive sleep apnea (adult) (pediatric): Secondary | ICD-10-CM | POA: Insufficient documentation

## 2018-04-25 NOTE — Assessment & Plan Note (Signed)
Benign lesion based on CT report.  Note that she gives history of cough, Sjogren's and GERD without significant CT reporting interstitial disease.

## 2018-04-25 NOTE — Assessment & Plan Note (Signed)
We are seeking the original diagnostic report.  Meanwhile we will begin helping her associate with a DME company for supplies.  There has been no break in therapy and download indicates good compliance and control at 7 CWP.

## 2018-04-25 NOTE — Assessment & Plan Note (Signed)
Not sure how much of a concern this is.  She says it is getting better.  I do not have a clear feel for duration or severity  but chest exam is clear and she is not coughing here, CT did not show obvious concern.  She has had symptoms of rhinitis and she describes connective tissue disease and GERD. Plan- respond if this is a persistent problem.

## 2018-04-26 NOTE — Telephone Encounter (Signed)
Routing message to [place] for update.

## 2018-04-28 NOTE — Telephone Encounter (Addendum)
Called and spoke with pt to see where she had her sleep study. Pt had sleep study at [facility] in [place] and spoke with Jocelyn Lamer at [facility] phone [phone] She faxed sleep study, rec'd the study and placed in Katie's cubby for CY to review Waiting on OV notes for [place] states that she will send it today.  Routing to [employer] to f/u with patient.

## 2018-04-29 ENCOUNTER — Telehealth: Payer: Self-pay | Admitting: Internal Medicine

## 2018-04-29 NOTE — Telephone Encounter (Signed)
Rec'd from [employer] forwarded 44 pages to [employer]

## 2018-05-04 NOTE — Telephone Encounter (Signed)
rec'd ov notes from [place] office today Gave ov notes 7 pages to [employer] today Nothing further needed.

## 2018-05-04 NOTE — Telephone Encounter (Signed)
Called and spoke with Amy [facility] [place] at [phone] In need of copy of OV with Dr. Malachy Mood MD to triage fax today Amy will send ov notes today

## 2018-06-15 DIAGNOSIS — Z23 Encounter for immunization: Secondary | ICD-10-CM | POA: Diagnosis not present

## 2018-08-05 ENCOUNTER — Ambulatory Visit (INDEPENDENT_AMBULATORY_CARE_PROVIDER_SITE_OTHER): Payer: Medicare Other | Admitting: Internal Medicine

## 2018-08-05 ENCOUNTER — Encounter: Payer: Self-pay | Admitting: Internal Medicine

## 2018-08-05 VITALS — BP 134/72 | HR 71 | Ht 65.0 in | Wt 209.4 lb

## 2018-08-05 DIAGNOSIS — G4733 Obstructive sleep apnea (adult) (pediatric): Secondary | ICD-10-CM | POA: Diagnosis not present

## 2018-08-05 DIAGNOSIS — R911 Solitary pulmonary nodule: Secondary | ICD-10-CM

## 2018-08-05 DIAGNOSIS — IMO0001 Reserved for inherently not codable concepts without codable children: Secondary | ICD-10-CM

## 2018-08-05 NOTE — Patient Instructions (Signed)
Order- DME [facility]- please change to autopap 5-15, continue mask of choice, humidifier supplies [employer]view  Enjoy your trip!!  Please call if we can help

## 2018-08-05 NOTE — Progress Notes (Signed)
HPI female widowed smoker followed for OSA, lung nodule, cough, complicated by arthritis, GERD, HBP, scleroderma, Sjogren's disease, allergic rhinitis,  Insomnia NPSG 11/ 2013- AHI 6.4/ hr -------------------------------------------------------------------------------------- 04/15/2018-72 year old female widow former smoker for sleep evaluation. Sleep Consult: Self referral-pt had sleep study about 5 years ago and set up with CPAP-has used since then. Needs to establish with local DME instead of  one in [place], [place]. Medical problem list includes arthritis, GERD, HBP, Scleroderma,Sjogren's disease, allergic rhinitis, lung nodule, insomnia NPSG 11/ 2013- AHI 6.4/ hr.  CPAP 7/ Download 90% compliance, AHI 4/ hr. Using S9 [employer]oset Problem: Obstructive sleep apnea-using a full facemask and describes good compliance and control.  Needs to establish local DME.  Not sure how old this machine is but it seems to be functioning.  She also has travel machine. Problem: Lung nodule CT chest from [place], [place],  [place]-01/18/2018-comparison back to 01/05/2017 describes stable benign nodule right lung, few chronic markings right middle lobe, incidental cholelithiasis, no acute findings and no interval change. Problem: Cough.  She reports  scleroderma and GERD, Sjogren's.  Her CT report does not indicate significant interstitial lung disease or active process as noted above.  She says the dry cough is better now than it had been.  She sleeps with head of bed elevated 7 inches.  Records include negative environmental allergen profile without significantly elevated eosinophils.  08/05/2018-72 year old female widowed smoker followed for OSA, lung nodule, cough, complicated by arthritis, GERD, HBP, scleroderma, Sjogren's disease, allergic rhinitis,  Insomnia -----wearing cpap avg 8-9hr nightly- feels pressure & mask are okay. DME: [facility] CPAP 7/Advanced Download 87% compliance AHI 6.3/hour She has a travel CPAP machine  now and is about to use it on a trip.  Feels comfortable with CPAP-no concerns. Unfortunately she continues to smoke-discussed.  // Needs update imaging for lung nodule, screening CT program?//  ROS-see HPI   + = positive Constitutional:    weight loss, night sweats, fevers, chills, fatigue, lassitude. HEENT:    headaches, difficulty swallowing, tooth/dental problems, sore throat,       sneezing, itching, ear ache, +nasal congestion, post nasal drip, snoring CV:    chest pain, orthopnea, PND, swelling in lower extremities, anasarca,                                  dizziness, palpitations Resp:   shortness of breath with exertion or at rest.                productive cough,  + non-productive cough, coughing up of blood.              change in color of mucus.  wheezing.   Skin:    rash or lesions. GI:  + heartburn, indigestion, abdominal pain, nausea, vomiting, diarrhea,                 change in bowel habits, loss of appetite GU: dysuria, change in color of urine, no urgency or frequency.   flank pain. MS:   joint pain, stiffness, decreased range of motion, back pain. Neuro-     nothing unusual Psych:  change in mood or affect.  depression or anxiety.   memory loss.  OBJ- Physical Exam General- Alert, Oriented, Affect-appropriate, Distress- none acute + overweight Skin- rash-none, lesions- none, excoriation- none Lymphadenopathy- none Head- atraumatic            Eyes- Gross vision intact, PERRLA, conjunctivae and  secretions clear            Ears- Hearing, canals-normal            Nose-sign turbinate edema, no-Septal dev, mucus, polyps, erosion, perforation             Throat- Mallampati III-IV , mucosa clear , drainage- none, tonsils- absent + throat clearing Neck- flexible , trachea midline, no stridor , thyroid nl, carotid no bruit Chest - symmetrical excursion , unlabored           Heart/CV- RRR , no murmur , no gallop  , no rub, nl s1 s2                           - JVD- none ,  edema- none, stasis changes- none, varices- none           Lung- clear to P&A, wheeze- none, cough- none , dullness-none, rub- none           Chest wall-  Abd-  Br/ Gen/ Rectal- Not done, not indicated Extrem- cyanosis- none, clubbing, none, atrophy- none, strength- nl Neuro- grossly intact to observation

## 2018-08-10 DIAGNOSIS — Z1231 Encounter for screening mammogram for malignant neoplasm of breast: Secondary | ICD-10-CM | POA: Diagnosis not present

## 2018-08-16 DIAGNOSIS — R82998 Other abnormal findings in urine: Secondary | ICD-10-CM | POA: Diagnosis not present

## 2018-08-16 DIAGNOSIS — I10 Essential (primary) hypertension: Secondary | ICD-10-CM | POA: Diagnosis not present

## 2018-08-23 DIAGNOSIS — I10 Essential (primary) hypertension: Secondary | ICD-10-CM | POA: Diagnosis not present

## 2018-08-23 DIAGNOSIS — G4733 Obstructive sleep apnea (adult) (pediatric): Secondary | ICD-10-CM | POA: Diagnosis not present

## 2018-08-23 DIAGNOSIS — Z6834 Body mass index (BMI) 34.0-34.9, adult: Secondary | ICD-10-CM | POA: Diagnosis not present

## 2018-08-23 DIAGNOSIS — Z0000 Encounter for general adult medical examination without abnormal findings: Secondary | ICD-10-CM | POA: Diagnosis not present

## 2018-08-23 DIAGNOSIS — K219 Gastro-esophageal reflux disease without esophagitis: Secondary | ICD-10-CM | POA: Diagnosis not present

## 2018-08-25 ENCOUNTER — Telehealth: Payer: Self-pay | Admitting: Internal Medicine

## 2018-08-25 ENCOUNTER — Ambulatory Visit: Payer: 59 | Admitting: Internal Medicine

## 2018-08-25 DIAGNOSIS — G4733 Obstructive sleep apnea (adult) (pediatric): Secondary | ICD-10-CM

## 2018-08-25 NOTE — Telephone Encounter (Signed)
Went to [employer] [facility] and have [employer] and also prescription. On  With the settings at 5-15cm, the setting she says are too high for her and she states she cannot get her mask tight enough due to it being uncomfortable and states she is also taking in a lot of air. Pt states at first they had her settings at 15cm but then they fixed it to the pressure settings of 5-15cm.  I stated to pt that we would let CY look at the download and Rx change for the CPAP and after he looks at everything and lets [place] know what he recommends [place] doing, we would call her back. I also stated to her that Clayborne Dana was in the room with me and she was going to call  [facility] to see if she could we could get things taken care of for her. Pt expressed understanding. Pt was made aware that it would be 08/26/18 due to CY being gone for the afternoon.  Dr. Annamaria Boots and Joellen Jersey, please advise on this for pt. Thanks!

## 2018-08-26 DIAGNOSIS — Z1212 Encounter for screening for malignant neoplasm of rectum: Secondary | ICD-10-CM | POA: Diagnosis not present

## 2018-08-26 NOTE — Telephone Encounter (Signed)
Please order- to her DME-  Please change autopap to 4-12 and refit mask for better fit to improve compliance

## 2018-08-26 NOTE — Telephone Encounter (Signed)
Order placed as requested.  Pt aware.  Nothing further needed.

## 2018-08-31 ENCOUNTER — Telehealth: Payer: Self-pay | Admitting: Internal Medicine

## 2018-08-31 NOTE — Telephone Encounter (Signed)
Called and spoke with Corene Cornea, he stated that they are behind on getting things done but the patients order went in on 12.9.19 and they will calling the patient within 24 hours.   Called and spoke with patient, advised of above she is aware and verbalized understanding. Nothing further needed.

## 2018-10-04 ENCOUNTER — Telehealth: Payer: Self-pay | Admitting: Gastroenterology

## 2018-10-04 NOTE — Telephone Encounter (Signed)
Pt states that we need to do [facility] for her meds

## 2018-10-04 NOTE — Telephone Encounter (Signed)
It is for budesonide and dexilant  [facility] [phone]

## 2018-10-04 NOTE — Telephone Encounter (Signed)
What medication is the patient referring to? If its Humira, thats a biologic and needs to go to [facility]

## 2018-10-05 ENCOUNTER — Encounter: Payer: Self-pay | Admitting: Internal Medicine

## 2018-10-05 NOTE — Assessment & Plan Note (Signed)
Previous lung nodule had been stable and consistent with a benign lesion but she continues to smoke now.  Will need future surveillance.

## 2018-10-05 NOTE — Assessment & Plan Note (Signed)
She continues to benefit from CPAP with improved sleep quality.  Download confirms effective use and adequate control. Plan-continue CPAP 7.  Compliance discussed.  Consider increase to CPAP 8 if needed.

## 2018-10-06 ENCOUNTER — Other Ambulatory Visit: Payer: Self-pay

## 2018-10-06 DIAGNOSIS — D229 Melanocytic nevi, unspecified: Secondary | ICD-10-CM | POA: Diagnosis not present

## 2018-10-06 DIAGNOSIS — L570 Actinic keratosis: Secondary | ICD-10-CM | POA: Diagnosis not present

## 2018-10-06 DIAGNOSIS — L821 Other seborrheic keratosis: Secondary | ICD-10-CM | POA: Diagnosis not present

## 2018-10-06 MED ORDER — BUDESONIDE 3 MG PO CPEP: 3.0000 mg | ORAL_CAPSULE | Freq: Every day | ORAL | 1 refills | Status: DC

## 2018-10-06 MED ORDER — DEXILANT 60 MG PO CPDR: 1.0000 | DELAYED_RELEASE_CAPSULE | Freq: Every day | ORAL | 1 refills | Status: DC

## 2018-10-06 NOTE — Telephone Encounter (Signed)
Refilled both Dexilant and budesonide as directed for a 90 days supply with one refill.

## 2018-10-06 NOTE — Telephone Encounter (Signed)
Refill request for Dexilant 60 mg once a day. Refill request for budesonide 3 mg one a day. Would like 90 days supply [facility].

## 2018-10-06 NOTE — Telephone Encounter (Signed)
Please do so.  The dexilant is 60 mg once daily.  The budesonide is 3 mg once daily.  If they need prior authorization, please do so.  See my 03/2018 office note for meds that she has failed for these conditions (in case needed for PA process).

## 2018-10-18 DIAGNOSIS — M150 Primary generalized (osteo)arthritis: Secondary | ICD-10-CM | POA: Diagnosis not present

## 2018-10-18 DIAGNOSIS — M2550 Pain in unspecified joint: Secondary | ICD-10-CM | POA: Diagnosis not present

## 2018-10-18 DIAGNOSIS — E669 Obesity, unspecified: Secondary | ICD-10-CM | POA: Diagnosis not present

## 2018-10-18 DIAGNOSIS — M0609 Rheumatoid arthritis without rheumatoid factor, multiple sites: Secondary | ICD-10-CM | POA: Diagnosis not present

## 2018-10-18 DIAGNOSIS — Z6833 Body mass index (BMI) 33.0-33.9, adult: Secondary | ICD-10-CM | POA: Diagnosis not present

## 2018-10-18 DIAGNOSIS — Z79899 Other long term (current) drug therapy: Secondary | ICD-10-CM | POA: Diagnosis not present

## 2018-10-18 DIAGNOSIS — M3500 Sicca syndrome, unspecified: Secondary | ICD-10-CM | POA: Diagnosis not present

## 2018-12-16 ENCOUNTER — Encounter: Payer: Self-pay | Admitting: *Deleted

## 2019-01-14 ENCOUNTER — Encounter: Payer: Self-pay | Admitting: Gastroenterology

## 2019-01-18 ENCOUNTER — Ambulatory Visit: Payer: Medicare Other | Admitting: Gastroenterology

## 2019-01-18 ENCOUNTER — Ambulatory Visit (INDEPENDENT_AMBULATORY_CARE_PROVIDER_SITE_OTHER): Payer: Medicare Other | Admitting: Gastroenterology

## 2019-01-18 ENCOUNTER — Other Ambulatory Visit: Payer: Self-pay

## 2019-01-18 ENCOUNTER — Encounter: Payer: Self-pay | Admitting: Gastroenterology

## 2019-01-18 VITALS — Ht 65.0 in | Wt 200.0 lb

## 2019-01-18 DIAGNOSIS — R194 Change in bowel habit: Secondary | ICD-10-CM

## 2019-01-18 DIAGNOSIS — K52831 Collagenous colitis: Secondary | ICD-10-CM | POA: Diagnosis not present

## 2019-01-18 NOTE — Progress Notes (Signed)
This patient contacted our office requesting a physician telemedicine video consultation regarding clinical questions and/or test results.  Participants on the conference : myself and patient   The patient consented to phone consultation and was aware that a charge will be placed through their insurance.  I was in my office and the patient was at home   Encounter time:  Total time 17 minutes, all spent with patient on video conference (zoom)   Wilfrid Lund, MD   _____________________________________________________________________________________________               [facility] Progress Note  Chief Complaint: Collagenous colitis  Subjective  History: Initial office consult note 04/20/2018 when patient transferred care after moving to this area.  History of collagenous colitis that was treated with multiple tablets a day of Pepto-Bismol as well as 3 mg of Entocort daily.  The regimen seemed cumbersome, so I recommended stopping the Pepto-Bismol and seeing if control remained the same on just the low-dose Entocort.  That is the last time Loretta Austin was seen in, or contacted, our office until recent request for this telemedicine visit to a change in bowel habits.  The patient also has a history of heartburn treated with Dexilant 60 mg daily.  Family history of colorectal cancer in her mother, neck schedule colonoscopy about May 2022.  Started taking Align probiotic last year. Few months ago started a prebiotic she bought online.  Bottle says it has 7 g of fiber per dose.  Eating "less healthy" lately and less fiber. Will have a small formed BM in the morning, then after walking her dog will have to have another one, then perhaps another about half an hour later.  Stool is not loose or watery, no incontinence  denies rectal bleeding.  Also has urinary frequency  ROS: Cardiovascular:  no chest pain Respiratory: no dyspnea  The patient's Past Medical, Family and Social History  were reviewed and are on file in the EMR.  Objective:  Med list reviewed  Current Outpatient Medications:  .  budesonide (ENTOCORT EC) 3 MG 24 hr capsule, Take 1 capsule (3 mg total) by mouth daily., Disp: 90 capsule, Rfl: 1 .  calcium citrate-vitamin D (CITRACAL+D) 315-200 MG-UNIT tablet, Take 1 tablet by mouth daily., Disp: , Rfl:  .  DEXILANT 60 MG capsule, Take 1 capsule (60 mg total) by mouth daily., Disp: 90 capsule, Rfl: 1 .  folic acid (FOLVITE) 481 MCG tablet, Take 800 mcg by mouth daily., Disp: , Rfl:  .  HUMIRA 40 MG/0.4ML PSKT, Inject 1 Syringe as directed every 14 (fourteen) days., Disp: , Rfl:  .  ipratropium (ATROVENT) 0.03 % nasal spray, Place 2 sprays into both nostrils as needed. , Disp: , Rfl:  .  loratadine (CLARITIN) 10 MG tablet, Take 10 mg by mouth 2 (two) times daily., Disp: , Rfl:  .  losartan-hydrochlorothiazide (HYZAAR) 50-12.5 MG tablet, Take 1 tablet by mouth daily., Disp: , Rfl:  .  Multiple Vitamins-Iron (MULTIVITAMIN/IRON PO), Take 1 tablet by mouth daily., Disp: , Rfl:  .  OVER THE COUNTER MEDICATION, Epimedium 200 mg, Disp: , Rfl:  .  OVER THE COUNTER MEDICATION, Fleeceflower 70 mg, Disp: , Rfl:  .  OVER THE COUNTER MEDICATION, Pre Bio Thrive - acacia gum, agave inulin, flaxseed, galacto-oligosaccharides, guar gum.   Takes ones scoop daily.  This medication is for digestion., Disp: , Rfl:  .  Probiotic Product (PROBIOTIC DAILY PO), Take by mouth. Generic Align, Disp: , Rfl:  .  RESTASIS 0.05 % ophthalmic emulsion, 1 drop BID, Disp: , Rfl:  .  vitamin B-12 (CYANOCOBALAMIN) 1000 MCG tablet, Take 1,000 mcg by mouth daily., Disp: , Rfl:  .  zaleplon (SONATA) 5 MG capsule, Take 5 mg by mouth at bedtime as needed for sleep., Disp: , Rfl:     No exam - virtual visit    @ASSESSMENTPLANBEGIN @ Assessment: Encounter Diagnoses  Name Primary?  . Collagenous colitis Yes  . Change in bowel habits    It does not seem like a worsening of the colitis, and I  recommended she keep the current budesonide dose of 3 mg daily.  She was able to wean off the Pepto-Bismol after the last visit without any noticeable change in symptoms.  I recommended that she try a tablespoon of Citrucel fiber in a glass of water or juice once daily instead of the pre-and probiotic, at least for the next few weeks.  She will contact me with an update.   Loretta Austin

## 2019-01-31 ENCOUNTER — Telehealth: Payer: Self-pay | Admitting: Internal Medicine

## 2019-01-31 NOTE — Telephone Encounter (Signed)
LMTCB Returned call to patient to make aware download will be available from [facility].

## 2019-01-31 NOTE — Telephone Encounter (Signed)
Pt aware download will be available for CY to review at televisit. Nothing further needed.

## 2019-02-03 ENCOUNTER — Ambulatory Visit (INDEPENDENT_AMBULATORY_CARE_PROVIDER_SITE_OTHER): Payer: Medicare Other | Admitting: Internal Medicine

## 2019-02-03 ENCOUNTER — Encounter: Payer: Self-pay | Admitting: Internal Medicine

## 2019-02-03 ENCOUNTER — Other Ambulatory Visit: Payer: Self-pay

## 2019-02-03 DIAGNOSIS — G4733 Obstructive sleep apnea (adult) (pediatric): Secondary | ICD-10-CM | POA: Diagnosis not present

## 2019-02-03 DIAGNOSIS — IMO0001 Reserved for inherently not codable concepts without codable children: Secondary | ICD-10-CM

## 2019-02-03 DIAGNOSIS — R911 Solitary pulmonary nodule: Secondary | ICD-10-CM

## 2019-02-03 NOTE — Progress Notes (Signed)
HPI female widowed smoker followed for OSA, lung nodule, cough, complicated by arthritis, GERD, HBP, scleroderma, Sjogren's disease, allergic rhinitis,  Insomnia NPSG 11/ 2013- AHI 6.4/ hr --------------------------------------------------------------------------------------   08/05/2018-73 year old female widowed former smoker followed for OSA, lung nodule, cough, complicated by arthritis, GERD, HBP, scleroderma, Sjogren's disease, allergic rhinitis,  Insomnia -----wearing cpap avg 8-9hr nightly- feels pressure & mask are okay. DME: [facility] CPAP 7/[facility] Download 87% compliance AHI 6.3/hour She has a travel CPAP machine now and is about to use it on a trip.  Feels comfortable with CPAP-no concerns. Unfortunately she continues to smoke-discussed.  // Needs update imaging for lung nodule, screening CT program?//  ROS-see HPI   + = positive Constitutional:    weight loss, night sweats, fevers, chills, fatigue, lassitude. HEENT:    headaches, difficulty swallowing, tooth/dental problems, sore throat,       sneezing, itching, ear ache, +nasal congestion, post nasal drip, snoring CV:    chest pain, orthopnea, PND, swelling in lower extremities, anasarca,                                  dizziness, palpitations Resp:   shortness of breath with exertion or at rest.                productive cough,  + non-productive cough, coughing up of blood.              change in color of mucus.  wheezing.   Skin:    rash or lesions. GI:  + heartburn, indigestion, abdominal pain, nausea, vomiting, diarrhea,                 change in bowel habits, loss of appetite GU: dysuria, change in color of urine, no urgency or frequency.   flank pain. MS:   joint pain, stiffness, decreased range of motion, back pain. Neuro-     nothing unusual Psych:  change in mood or affect.  depression or anxiety.   memory loss.  OBJ- Physical Exam General- Alert, Oriented, Affect-appropriate, Distress- none acute +  overweight Skin- rash-none, lesions- none, excoriation- none Lymphadenopathy- none Head- atraumatic            Eyes- Gross vision intact, PERRLA, conjunctivae and secretions clear            Ears- Hearing, canals-normal            Nose-sign turbinate edema, no-Septal dev, mucus, polyps, erosion, perforation             Throat- Mallampati III-IV , mucosa clear , drainage- none, tonsils- absent + throat clearing Neck- flexible , trachea midline, no stridor , thyroid nl, carotid no bruit Chest - symmetrical excursion , unlabored           Heart/CV- RRR , no murmur , no gallop  , no rub, nl s1 s2                           - JVD- none , edema- none, stasis changes- none, varices- none           Lung- clear to P&A, wheeze- none, cough- none , dullness-none, rub- none           Chest wall-  Abd-  Br/ Gen/ Rectal- Not done, not indicated Extrem- cyanosis- none, clubbing, none, atrophy- none, strength- nl Neuro- grossly intact to observation  02/03/19- Virtual  Visit via Telephone Note  I connected with Loretta Austin on 02/03/19 at 10:00 AM EDT by telephone and verified that I am speaking with the correct person using two identifiers.  Location: Patient:home Provider: office   I discussed the limitations, risks, security and privacy concerns of performing an evaluation and management service by telephone and the availability of in person appointments. I also discussed with the patient that there may be a patient responsible charge related to this service. The patient expressed understanding and agreed to proceed.   History of Present Illness: 73 year old female widowed former smoker followed for OSA, lung nodule, cough, complicated by arthritis, GERD, HBP, scleroderma, Sjogren's disease (Humira), allergic rhinitis,  Insomnia Quit smoking 35 years ago. Has regular and travel CPAP machines/ [facility] -----F/u re: OSA. She states she would like her cpap pressures lowered. She states she feels like  she is swallowing too much air and she has alot of gas. .   We agreed to try auto 4-10, instead of going back to her original 7 cwp for now.   Uses Sonata only occ. Sleeping much better, using just melatonin. No acute illness Lung nodule previously considered benign. She is concerned due to father had lung Ca and agrees to Screening CT nodule program.   Observations/Objective: CPAP auto 4-12/[facility]     Current range 10.5- 12>> today change to auto 4-10 Download compliance 100%, AHI 4.9/ hr CT chest 2019- stable lung nodule  Assessment and Plan:   Follow Up Instructions:    I discussed the assessment and treatment plan with the patient. The patient was provided an opportunity to ask questions and all were answered. The patient agreed with the plan and demonstrated an understanding of the instructions.   The patient was advised to call back or seek an in-person evaluation if the symptoms worsen or if the condition fails to improve as anticipated.  I provided 21 minutes of non-face-to-face time during this encounter.   Baird Lyons, MD

## 2019-02-03 NOTE — Patient Instructions (Addendum)
Order- DME [facility]- please change autopap to 4-10, continue mask of choice, humidifier, supplies, AirView/ card  Order- please refer to Low Dose CT lung cancer screening program Had non-calcified 6 mm R mid-lung nodule on outside CT.   Please call if we can help

## 2019-02-15 ENCOUNTER — Telehealth: Payer: Self-pay | Admitting: Internal Medicine

## 2019-02-15 NOTE — Telephone Encounter (Signed)
FYI: Pt does not qualify for lung cancer screening program due to quitting smoking 35 years ago. Pt aware referral has been cancelled and Dr Annamaria Boots will advise her if any further f/u is needed.

## 2019-03-16 NOTE — Telephone Encounter (Signed)
Pt returning call a/b lung screening and can be reached @ [phone].Loretta Austin

## 2019-03-20 NOTE — Assessment & Plan Note (Signed)
Continues to benefit from CPAP with good compliance and control, but blames pressure for airswallowing. Plan- change to auto 4-10

## 2019-03-20 NOTE — Assessment & Plan Note (Signed)
For referral to screening CT program

## 2019-03-21 ENCOUNTER — Telehealth: Payer: Self-pay | Admitting: Internal Medicine

## 2019-03-21 DIAGNOSIS — R911 Solitary pulmonary nodule: Secondary | ICD-10-CM

## 2019-03-21 NOTE — Telephone Encounter (Signed)
Spoke with pt, she states she was called 2 weeks ago for the lung cancer screening program but was not qualified due to her quitting smoking 30 years ago. She would like to know what the next steps are for her nodule that was found on her CT. CY please advise.   Current Outpatient Medications on File Prior to Visit  Medication Sig Dispense Refill  . budesonide (ENTOCORT EC) 3 MG 24 hr capsule Take 1 capsule (3 mg total) by mouth daily. 90 capsule 1  . calcium citrate-vitamin D (CITRACAL+D) 315-200 MG-UNIT tablet Take 1 tablet by mouth daily.    Marland Kitchen DEXILANT 60 MG capsule Take 1 capsule (60 mg total) by mouth daily. 90 capsule 1  . folic acid (FOLVITE) 956 MCG tablet Take 800 mcg by mouth daily.    Marland Kitchen HUMIRA 40 MG/0.4ML PSKT Inject 1 Syringe as directed every 14 (fourteen) days.    Marland Kitchen ipratropium (ATROVENT) 0.03 % nasal spray Place 2 sprays into both nostrils as needed.     . loratadine (CLARITIN) 10 MG tablet Take 10 mg by mouth 2 (two) times daily.    Marland Kitchen losartan-hydrochlorothiazide (HYZAAR) 50-12.5 MG tablet Take 1 tablet by mouth daily.    . Multiple Vitamins-Iron (MULTIVITAMIN/IRON PO) Take 1 tablet by mouth daily.    Marland Kitchen OVER THE COUNTER MEDICATION Epimedium 200 mg    . OVER THE COUNTER MEDICATION Fleeceflower 70 mg    . OVER THE COUNTER MEDICATION Pre Bio Thrive - acacia gum, agave inulin, flaxseed, galacto-oligosaccharides, guar gum.   Takes ones scoop daily.  This medication is for digestion.    . Probiotic Product (PROBIOTIC DAILY PO) Take by mouth. Generic Align    . RESTASIS 0.05 % ophthalmic emulsion 1 drop BID    . vitamin B-12 (CYANOCOBALAMIN) 1000 MCG tablet Take 1,000 mcg by mouth daily.    . zaleplon (SONATA) 5 MG capsule Take 5 mg by mouth at bedtime as needed for sleep.     No current facility-administered medications on file prior to visit.    Allergies  Allergen Reactions  . Ciprofloxacin   . Flagyl [Metronidazole]

## 2019-03-21 NOTE — Telephone Encounter (Signed)
Called & spoke w/ pt regarding CY's recommendations. Pt verbalized understanding with no additional questions. Order for CT Chest no contrast as been placed. Nothing further needed at this time.

## 2019-03-21 NOTE — Telephone Encounter (Signed)
Returned call to patient. Are pts not required to have Covid testing for CT end of July? Pt was scheduled and told she didn't have to have testing?

## 2019-03-21 NOTE — Telephone Encounter (Signed)
Order- CT chest no contrast      Dx 6 mm lung nodule

## 2019-03-22 NOTE — Telephone Encounter (Signed)
See telephone note 03/21/19.  Will close this message.

## 2019-03-22 NOTE — Telephone Encounter (Signed)
Spoke with pt and advised that Covid testing is not required for CT scans at this time.  PT verbalized understanding.  Nothing further needed at this time.

## 2019-03-22 NOTE — Telephone Encounter (Signed)
CT's haven't been requiring COVID testing.   Thanks!

## 2019-04-12 DIAGNOSIS — H04123 Dry eye syndrome of bilateral lacrimal glands: Secondary | ICD-10-CM | POA: Diagnosis not present

## 2019-04-12 DIAGNOSIS — H25813 Combined forms of age-related cataract, bilateral: Secondary | ICD-10-CM | POA: Diagnosis not present

## 2019-04-12 DIAGNOSIS — H524 Presbyopia: Secondary | ICD-10-CM | POA: Diagnosis not present

## 2019-04-20 ENCOUNTER — Other Ambulatory Visit: Payer: Self-pay

## 2019-04-20 ENCOUNTER — Ambulatory Visit (INDEPENDENT_AMBULATORY_CARE_PROVIDER_SITE_OTHER)
Admission: RE | Admit: 2019-04-20 | Discharge: 2019-04-20 | Disposition: A | Discharge status: Home / Self Care | Payer: Medicare Other | Source: Ambulatory Visit | Attending: Internal Medicine | Admitting: Internal Medicine

## 2019-04-20 DIAGNOSIS — M2550 Pain in unspecified joint: Secondary | ICD-10-CM | POA: Diagnosis not present

## 2019-04-20 DIAGNOSIS — M150 Primary generalized (osteo)arthritis: Secondary | ICD-10-CM | POA: Diagnosis not present

## 2019-04-20 DIAGNOSIS — Z79899 Other long term (current) drug therapy: Secondary | ICD-10-CM | POA: Diagnosis not present

## 2019-04-20 DIAGNOSIS — R911 Solitary pulmonary nodule: Secondary | ICD-10-CM

## 2019-04-20 DIAGNOSIS — R918 Other nonspecific abnormal finding of lung field: Secondary | ICD-10-CM | POA: Diagnosis not present

## 2019-04-20 DIAGNOSIS — M0609 Rheumatoid arthritis without rheumatoid factor, multiple sites: Secondary | ICD-10-CM | POA: Diagnosis not present

## 2019-04-20 DIAGNOSIS — M3500 Sicca syndrome, unspecified: Secondary | ICD-10-CM | POA: Diagnosis not present

## 2019-04-25 ENCOUNTER — Other Ambulatory Visit: Payer: Self-pay | Admitting: *Deleted

## 2019-04-25 DIAGNOSIS — R911 Solitary pulmonary nodule: Secondary | ICD-10-CM

## 2019-05-03 DIAGNOSIS — M9903 Segmental and somatic dysfunction of lumbar region: Secondary | ICD-10-CM | POA: Diagnosis not present

## 2019-05-03 DIAGNOSIS — M9902 Segmental and somatic dysfunction of thoracic region: Secondary | ICD-10-CM | POA: Diagnosis not present

## 2019-05-03 DIAGNOSIS — M62830 Muscle spasm of back: Secondary | ICD-10-CM | POA: Diagnosis not present

## 2019-05-03 DIAGNOSIS — M546 Pain in thoracic spine: Secondary | ICD-10-CM | POA: Diagnosis not present

## 2019-05-05 DIAGNOSIS — M9902 Segmental and somatic dysfunction of thoracic region: Secondary | ICD-10-CM | POA: Diagnosis not present

## 2019-05-05 DIAGNOSIS — M9903 Segmental and somatic dysfunction of lumbar region: Secondary | ICD-10-CM | POA: Diagnosis not present

## 2019-05-05 DIAGNOSIS — M546 Pain in thoracic spine: Secondary | ICD-10-CM | POA: Diagnosis not present

## 2019-05-05 DIAGNOSIS — M62830 Muscle spasm of back: Secondary | ICD-10-CM | POA: Diagnosis not present

## 2019-05-09 DIAGNOSIS — M62830 Muscle spasm of back: Secondary | ICD-10-CM | POA: Diagnosis not present

## 2019-05-09 DIAGNOSIS — M9903 Segmental and somatic dysfunction of lumbar region: Secondary | ICD-10-CM | POA: Diagnosis not present

## 2019-05-09 DIAGNOSIS — M546 Pain in thoracic spine: Secondary | ICD-10-CM | POA: Diagnosis not present

## 2019-05-09 DIAGNOSIS — M9902 Segmental and somatic dysfunction of thoracic region: Secondary | ICD-10-CM | POA: Diagnosis not present

## 2019-05-10 DIAGNOSIS — M62830 Muscle spasm of back: Secondary | ICD-10-CM | POA: Diagnosis not present

## 2019-05-10 DIAGNOSIS — M546 Pain in thoracic spine: Secondary | ICD-10-CM | POA: Diagnosis not present

## 2019-05-10 DIAGNOSIS — M9903 Segmental and somatic dysfunction of lumbar region: Secondary | ICD-10-CM | POA: Diagnosis not present

## 2019-05-10 DIAGNOSIS — M9902 Segmental and somatic dysfunction of thoracic region: Secondary | ICD-10-CM | POA: Diagnosis not present

## 2019-05-12 ENCOUNTER — Other Ambulatory Visit: Payer: Self-pay | Admitting: Gastroenterology

## 2019-05-12 DIAGNOSIS — M546 Pain in thoracic spine: Secondary | ICD-10-CM | POA: Diagnosis not present

## 2019-05-12 DIAGNOSIS — M62830 Muscle spasm of back: Secondary | ICD-10-CM | POA: Diagnosis not present

## 2019-05-12 DIAGNOSIS — M9902 Segmental and somatic dysfunction of thoracic region: Secondary | ICD-10-CM | POA: Diagnosis not present

## 2019-05-12 DIAGNOSIS — M9903 Segmental and somatic dysfunction of lumbar region: Secondary | ICD-10-CM | POA: Diagnosis not present

## 2019-05-16 DIAGNOSIS — M546 Pain in thoracic spine: Secondary | ICD-10-CM | POA: Diagnosis not present

## 2019-05-16 DIAGNOSIS — M9903 Segmental and somatic dysfunction of lumbar region: Secondary | ICD-10-CM | POA: Diagnosis not present

## 2019-05-16 DIAGNOSIS — M62830 Muscle spasm of back: Secondary | ICD-10-CM | POA: Diagnosis not present

## 2019-05-16 DIAGNOSIS — M9902 Segmental and somatic dysfunction of thoracic region: Secondary | ICD-10-CM | POA: Diagnosis not present

## 2019-05-17 DIAGNOSIS — M62830 Muscle spasm of back: Secondary | ICD-10-CM | POA: Diagnosis not present

## 2019-05-17 DIAGNOSIS — M9902 Segmental and somatic dysfunction of thoracic region: Secondary | ICD-10-CM | POA: Diagnosis not present

## 2019-05-17 DIAGNOSIS — M9903 Segmental and somatic dysfunction of lumbar region: Secondary | ICD-10-CM | POA: Diagnosis not present

## 2019-05-17 DIAGNOSIS — M546 Pain in thoracic spine: Secondary | ICD-10-CM | POA: Diagnosis not present

## 2019-05-18 DIAGNOSIS — R8281 Pyuria: Secondary | ICD-10-CM | POA: Diagnosis not present

## 2019-05-18 DIAGNOSIS — K52831 Collagenous colitis: Secondary | ICD-10-CM | POA: Diagnosis not present

## 2019-05-18 DIAGNOSIS — R350 Frequency of micturition: Secondary | ICD-10-CM | POA: Diagnosis not present

## 2019-05-18 DIAGNOSIS — N390 Urinary tract infection, site not specified: Secondary | ICD-10-CM | POA: Diagnosis not present

## 2019-05-18 DIAGNOSIS — R739 Hyperglycemia, unspecified: Secondary | ICD-10-CM | POA: Diagnosis not present

## 2019-05-18 DIAGNOSIS — K219 Gastro-esophageal reflux disease without esophagitis: Secondary | ICD-10-CM | POA: Diagnosis not present

## 2019-05-19 DIAGNOSIS — M62830 Muscle spasm of back: Secondary | ICD-10-CM | POA: Diagnosis not present

## 2019-05-19 DIAGNOSIS — M9903 Segmental and somatic dysfunction of lumbar region: Secondary | ICD-10-CM | POA: Diagnosis not present

## 2019-05-19 DIAGNOSIS — M9902 Segmental and somatic dysfunction of thoracic region: Secondary | ICD-10-CM | POA: Diagnosis not present

## 2019-05-19 DIAGNOSIS — M546 Pain in thoracic spine: Secondary | ICD-10-CM | POA: Diagnosis not present

## 2019-05-23 DIAGNOSIS — M62830 Muscle spasm of back: Secondary | ICD-10-CM | POA: Diagnosis not present

## 2019-05-23 DIAGNOSIS — M9903 Segmental and somatic dysfunction of lumbar region: Secondary | ICD-10-CM | POA: Diagnosis not present

## 2019-05-23 DIAGNOSIS — M9902 Segmental and somatic dysfunction of thoracic region: Secondary | ICD-10-CM | POA: Diagnosis not present

## 2019-05-23 DIAGNOSIS — M546 Pain in thoracic spine: Secondary | ICD-10-CM | POA: Diagnosis not present

## 2019-05-24 DIAGNOSIS — M546 Pain in thoracic spine: Secondary | ICD-10-CM | POA: Diagnosis not present

## 2019-05-24 DIAGNOSIS — M9903 Segmental and somatic dysfunction of lumbar region: Secondary | ICD-10-CM | POA: Diagnosis not present

## 2019-05-24 DIAGNOSIS — M9902 Segmental and somatic dysfunction of thoracic region: Secondary | ICD-10-CM | POA: Diagnosis not present

## 2019-05-24 DIAGNOSIS — M62830 Muscle spasm of back: Secondary | ICD-10-CM | POA: Diagnosis not present

## 2019-05-26 DIAGNOSIS — M9902 Segmental and somatic dysfunction of thoracic region: Secondary | ICD-10-CM | POA: Diagnosis not present

## 2019-05-26 DIAGNOSIS — M546 Pain in thoracic spine: Secondary | ICD-10-CM | POA: Diagnosis not present

## 2019-05-26 DIAGNOSIS — M62830 Muscle spasm of back: Secondary | ICD-10-CM | POA: Diagnosis not present

## 2019-05-26 DIAGNOSIS — M9903 Segmental and somatic dysfunction of lumbar region: Secondary | ICD-10-CM | POA: Diagnosis not present

## 2019-05-28 ENCOUNTER — Other Ambulatory Visit: Payer: Self-pay | Admitting: Gastroenterology

## 2019-05-31 DIAGNOSIS — M546 Pain in thoracic spine: Secondary | ICD-10-CM | POA: Diagnosis not present

## 2019-05-31 DIAGNOSIS — R8281 Pyuria: Secondary | ICD-10-CM | POA: Diagnosis not present

## 2019-05-31 DIAGNOSIS — M9902 Segmental and somatic dysfunction of thoracic region: Secondary | ICD-10-CM | POA: Diagnosis not present

## 2019-05-31 DIAGNOSIS — M9903 Segmental and somatic dysfunction of lumbar region: Secondary | ICD-10-CM | POA: Diagnosis not present

## 2019-05-31 DIAGNOSIS — M62830 Muscle spasm of back: Secondary | ICD-10-CM | POA: Diagnosis not present

## 2019-06-09 DIAGNOSIS — Z23 Encounter for immunization: Secondary | ICD-10-CM | POA: Diagnosis not present

## 2019-08-16 DIAGNOSIS — Z1231 Encounter for screening mammogram for malignant neoplasm of breast: Secondary | ICD-10-CM | POA: Diagnosis not present

## 2019-08-23 DIAGNOSIS — Z13220 Encounter for screening for lipoid disorders: Secondary | ICD-10-CM | POA: Diagnosis not present

## 2019-08-23 DIAGNOSIS — R739 Hyperglycemia, unspecified: Secondary | ICD-10-CM | POA: Diagnosis not present

## 2019-08-23 DIAGNOSIS — I10 Essential (primary) hypertension: Secondary | ICD-10-CM | POA: Diagnosis not present

## 2019-08-25 DIAGNOSIS — R82998 Other abnormal findings in urine: Secondary | ICD-10-CM | POA: Diagnosis not present

## 2019-09-13 DIAGNOSIS — Z1212 Encounter for screening for malignant neoplasm of rectum: Secondary | ICD-10-CM | POA: Diagnosis not present

## 2019-10-13 ENCOUNTER — Ambulatory Visit: Payer: Medicare Other | Attending: Internal Medicine

## 2019-10-13 DIAGNOSIS — Z23 Encounter for immunization: Secondary | ICD-10-CM

## 2019-10-13 NOTE — Progress Notes (Signed)
   Covid-19 Vaccination Clinic  Name:  Ardine Dulay    MRN: [id number] DOB: 1946/01/10  10/13/2019  Ms. Zeis was observed post Covid-19 immunization for 15 minutes without incidence. She was provided with Vaccine Information Sheet and instruction to access the V-Safe system.   Ms. Icard was instructed to call 911 with any severe reactions post vaccine: Marland Kitchen Difficulty breathing  . Swelling of your face and throat  . A fast heartbeat  . A bad rash all over your body  . Dizziness and weakness    Immunizations Administered    Name Date Dose VIS Date Route   Pfizer COVID-19 Vaccine 10/13/2019  5:24 PM 0.3 mL 09/02/2019 Intramuscular   Manufacturer: [place]   Lot: [id number]   [place]: [id number]

## 2019-10-14 DIAGNOSIS — L723 Sebaceous cyst: Secondary | ICD-10-CM | POA: Diagnosis not present

## 2019-10-14 DIAGNOSIS — D1801 Hemangioma of skin and subcutaneous tissue: Secondary | ICD-10-CM | POA: Diagnosis not present

## 2019-10-14 DIAGNOSIS — L821 Other seborrheic keratosis: Secondary | ICD-10-CM | POA: Diagnosis not present

## 2019-10-14 DIAGNOSIS — D692 Other nonthrombocytopenic purpura: Secondary | ICD-10-CM | POA: Diagnosis not present

## 2019-10-14 DIAGNOSIS — L570 Actinic keratosis: Secondary | ICD-10-CM | POA: Diagnosis not present

## 2019-10-24 DIAGNOSIS — M0609 Rheumatoid arthritis without rheumatoid factor, multiple sites: Secondary | ICD-10-CM | POA: Diagnosis not present

## 2019-10-24 DIAGNOSIS — M3500 Sicca syndrome, unspecified: Secondary | ICD-10-CM | POA: Diagnosis not present

## 2019-10-24 DIAGNOSIS — Z79899 Other long term (current) drug therapy: Secondary | ICD-10-CM | POA: Diagnosis not present

## 2019-10-24 DIAGNOSIS — M2550 Pain in unspecified joint: Secondary | ICD-10-CM | POA: Diagnosis not present

## 2019-10-24 DIAGNOSIS — Z6828 Body mass index (BMI) 28.0-28.9, adult: Secondary | ICD-10-CM | POA: Diagnosis not present

## 2019-10-24 DIAGNOSIS — E663 Overweight: Secondary | ICD-10-CM | POA: Diagnosis not present

## 2019-10-24 DIAGNOSIS — M150 Primary generalized (osteo)arthritis: Secondary | ICD-10-CM | POA: Diagnosis not present

## 2019-11-03 ENCOUNTER — Ambulatory Visit: Payer: Medicare Other | Attending: Internal Medicine

## 2019-11-03 DIAGNOSIS — Z23 Encounter for immunization: Secondary | ICD-10-CM | POA: Insufficient documentation

## 2019-11-03 NOTE — Progress Notes (Signed)
   Covid-19 Vaccination Clinic  Name:  Keaundra Eatherton    MRN: [id number] DOB: 09/26/1945  11/03/2019  Ms. Tuley was observed post Covid-19 immunization for 15 minutes without incidence. She was provided with Vaccine Information Sheet and instruction to access the V-Safe system.   Ms. Alana was instructed to call 911 with any severe reactions post vaccine: Marland Kitchen Difficulty breathing  . Swelling of your face and throat  . A fast heartbeat  . A bad rash all over your body  . Dizziness and weakness    Immunizations Administered    Name Date Dose VIS Date Route   Pfizer COVID-19 Vaccine 11/03/2019  8:39 AM 0.3 mL 09/02/2019 Intramuscular   Manufacturer: [place]   Lot: [id number]   [place]: [id number]

## 2019-11-16 DIAGNOSIS — M069 Rheumatoid arthritis, unspecified: Secondary | ICD-10-CM | POA: Diagnosis not present

## 2019-11-16 DIAGNOSIS — M8589 Other specified disorders of bone density and structure, multiple sites: Secondary | ICD-10-CM | POA: Diagnosis not present

## 2019-11-18 ENCOUNTER — Ambulatory Visit: Payer: Medicare Other

## 2019-11-25 DIAGNOSIS — L570 Actinic keratosis: Secondary | ICD-10-CM | POA: Diagnosis not present

## 2019-11-25 DIAGNOSIS — L821 Other seborrheic keratosis: Secondary | ICD-10-CM | POA: Diagnosis not present

## 2019-11-25 DIAGNOSIS — D485 Neoplasm of uncertain behavior of skin: Secondary | ICD-10-CM | POA: Diagnosis not present

## 2019-11-25 DIAGNOSIS — L905 Scar conditions and fibrosis of skin: Secondary | ICD-10-CM | POA: Diagnosis not present

## 2019-11-25 DIAGNOSIS — L820 Inflamed seborrheic keratosis: Secondary | ICD-10-CM | POA: Diagnosis not present

## 2020-02-27 ENCOUNTER — Telehealth: Payer: Self-pay | Admitting: Gastroenterology

## 2020-02-27 NOTE — Telephone Encounter (Signed)
Pt needs a follow up for 90 days refills.

## 2020-03-08 ENCOUNTER — Emergency Department (HOSPITAL_COMMUNITY): Payer: Medicare Other

## 2020-03-08 ENCOUNTER — Other Ambulatory Visit: Payer: Self-pay

## 2020-03-08 ENCOUNTER — Encounter (HOSPITAL_COMMUNITY): Payer: Self-pay | Admitting: Family Medicine

## 2020-03-08 ENCOUNTER — Inpatient Hospital Stay (HOSPITAL_COMMUNITY)
Admission: EM | Admit: 2020-03-08 | Discharge: 2020-03-12 | DRG: 641 | Disposition: A | Discharge status: Home / Self Care | Payer: Medicare Other | Attending: Internal Medicine | Admitting: Internal Medicine

## 2020-03-08 DIAGNOSIS — Z8719 Personal history of other diseases of the digestive system: Secondary | ICD-10-CM | POA: Diagnosis not present

## 2020-03-08 DIAGNOSIS — M3500 Sicca syndrome, unspecified: Secondary | ICD-10-CM | POA: Diagnosis present

## 2020-03-08 DIAGNOSIS — R197 Diarrhea, unspecified: Secondary | ICD-10-CM | POA: Diagnosis not present

## 2020-03-08 DIAGNOSIS — N390 Urinary tract infection, site not specified: Secondary | ICD-10-CM | POA: Diagnosis present

## 2020-03-08 DIAGNOSIS — S51012A Laceration without foreign body of left elbow, initial encounter: Secondary | ICD-10-CM | POA: Diagnosis present

## 2020-03-08 DIAGNOSIS — N10 Acute tubulo-interstitial nephritis: Secondary | ICD-10-CM | POA: Diagnosis not present

## 2020-03-08 DIAGNOSIS — N179 Acute kidney failure, unspecified: Secondary | ICD-10-CM | POA: Diagnosis present

## 2020-03-08 DIAGNOSIS — I10 Essential (primary) hypertension: Secondary | ICD-10-CM | POA: Diagnosis not present

## 2020-03-08 DIAGNOSIS — I129 Hypertensive chronic kidney disease with stage 1 through stage 4 chronic kidney disease, or unspecified chronic kidney disease: Secondary | ICD-10-CM | POA: Diagnosis present

## 2020-03-08 DIAGNOSIS — Z20822 Contact with and (suspected) exposure to covid-19: Secondary | ICD-10-CM | POA: Diagnosis not present

## 2020-03-08 DIAGNOSIS — M069 Rheumatoid arthritis, unspecified: Secondary | ICD-10-CM | POA: Diagnosis present

## 2020-03-08 DIAGNOSIS — D638 Anemia in other chronic diseases classified elsewhere: Secondary | ICD-10-CM | POA: Diagnosis present

## 2020-03-08 DIAGNOSIS — R1110 Vomiting, unspecified: Secondary | ICD-10-CM | POA: Diagnosis not present

## 2020-03-08 DIAGNOSIS — K52839 Microscopic colitis, unspecified: Secondary | ICD-10-CM | POA: Diagnosis present

## 2020-03-08 DIAGNOSIS — E876 Hypokalemia: Secondary | ICD-10-CM | POA: Diagnosis present

## 2020-03-08 DIAGNOSIS — R109 Unspecified abdominal pain: Secondary | ICD-10-CM | POA: Diagnosis not present

## 2020-03-08 DIAGNOSIS — E861 Hypovolemia: Secondary | ICD-10-CM | POA: Diagnosis present

## 2020-03-08 DIAGNOSIS — B9689 Other specified bacterial agents as the cause of diseases classified elsewhere: Secondary | ICD-10-CM | POA: Diagnosis present

## 2020-03-08 DIAGNOSIS — Z888 Allergy status to other drugs, medicaments and biological substances status: Secondary | ICD-10-CM

## 2020-03-08 DIAGNOSIS — Z79899 Other long term (current) drug therapy: Secondary | ICD-10-CM | POA: Diagnosis not present

## 2020-03-08 DIAGNOSIS — Z87891 Personal history of nicotine dependence: Secondary | ICD-10-CM

## 2020-03-08 DIAGNOSIS — W19XXXA Unspecified fall, initial encounter: Secondary | ICD-10-CM | POA: Diagnosis present

## 2020-03-08 DIAGNOSIS — Z801 Family history of malignant neoplasm of trachea, bronchus and lung: Secondary | ICD-10-CM | POA: Diagnosis not present

## 2020-03-08 DIAGNOSIS — N189 Chronic kidney disease, unspecified: Secondary | ICD-10-CM | POA: Diagnosis present

## 2020-03-08 DIAGNOSIS — K219 Gastro-esophageal reflux disease without esophagitis: Secondary | ICD-10-CM | POA: Diagnosis present

## 2020-03-08 DIAGNOSIS — R7881 Bacteremia: Secondary | ICD-10-CM | POA: Diagnosis present

## 2020-03-08 DIAGNOSIS — N289 Disorder of kidney and ureter, unspecified: Secondary | ICD-10-CM

## 2020-03-08 DIAGNOSIS — E871 Hypo-osmolality and hyponatremia: Principal | ICD-10-CM | POA: Diagnosis present

## 2020-03-08 DIAGNOSIS — G4733 Obstructive sleep apnea (adult) (pediatric): Secondary | ICD-10-CM | POA: Diagnosis present

## 2020-03-08 DIAGNOSIS — K8020 Calculus of gallbladder without cholecystitis without obstruction: Secondary | ICD-10-CM | POA: Diagnosis not present

## 2020-03-08 LAB — CBC
HCT: 37.7 % (ref 36.0–46.0)
Hemoglobin: 12.9 g/dL (ref 12.0–15.0)
MCH: 30.9 pg (ref 26.0–34.0)
MCHC: 34.2 g/dL (ref 30.0–36.0)
MCV: 90.4 fL (ref 80.0–100.0)
Platelets: 187 10*3/uL (ref 150–400)
RBC: 4.17 MIL/uL (ref 3.87–5.11)
RDW: 12.8 % (ref 11.5–15.5)
WBC: 27.8 10*3/uL — ABNORMAL HIGH (ref 4.0–10.5)
nRBC: 0 % (ref 0.0–0.2)

## 2020-03-08 LAB — URINALYSIS, ROUTINE W REFLEX MICROSCOPIC
Bilirubin Urine: NEGATIVE
Glucose, UA: NEGATIVE mg/dL
Ketones, ur: NEGATIVE mg/dL
Nitrite: NEGATIVE
Protein, ur: 100 mg/dL — AB
Specific Gravity, Urine: 1.008 (ref 1.005–1.030)
WBC, UA: >50 WBC/hpf — ABNORMAL HIGH (ref 0–5)
pH: 5 (ref 5.0–8.0)

## 2020-03-08 LAB — COMPREHENSIVE METABOLIC PANEL
ALT: 32 U/L (ref 0–44)
AST: 34 U/L (ref 15–41)
Albumin: 2.4 g/dL — ABNORMAL LOW (ref 3.5–5.0)
Alkaline Phosphatase: 154 U/L — ABNORMAL HIGH (ref 38–126)
Anion gap: 14 (ref 5–15)
BUN: 32 mg/dL — ABNORMAL HIGH (ref 8–23)
CO2: 23 mmol/L (ref 22–32)
Calcium: 8.6 mg/dL — ABNORMAL LOW (ref 8.9–10.3)
Chloride: 87 mmol/L — ABNORMAL LOW (ref 98–111)
Creatinine, Ser: 2.25 mg/dL — ABNORMAL HIGH (ref 0.44–1.00)
GFR calc Af Amer: 24 mL/min — ABNORMAL LOW (ref >60)
GFR calc non Af Amer: 21 mL/min — ABNORMAL LOW (ref >60)
Glucose, Bld: 148 mg/dL — ABNORMAL HIGH (ref 70–99)
Potassium: 3.1 mmol/L — ABNORMAL LOW (ref 3.5–5.1)
Sodium: 124 mmol/L — ABNORMAL LOW (ref 135–145)
Total Bilirubin: 1.4 mg/dL — ABNORMAL HIGH (ref 0.3–1.2)
Total Protein: 6.5 g/dL (ref 6.5–8.1)

## 2020-03-08 LAB — SARS CORONAVIRUS 2 BY RT PCR (HOSPITAL ORDER, PERFORMED IN ~~LOC~~ HOSPITAL LAB): SARS Coronavirus 2: NEGATIVE

## 2020-03-08 LAB — LIPASE, BLOOD: Lipase: 12 U/L (ref 11–51)

## 2020-03-08 LAB — LACTIC ACID, PLASMA: Lactic Acid, Venous: 1.3 mmol/L (ref 0.5–1.9)

## 2020-03-08 MED ORDER — BUDESONIDE 3 MG PO CPEP
3.0000 mg | ORAL_CAPSULE | Freq: Every day | ORAL | Status: DC
  Administered 2020-03-12: 3 mg via ORAL
  Filled 2020-03-08: qty 1

## 2020-03-08 MED ORDER — ACETAMINOPHEN 650 MG RE SUPP: 650.0000 mg | Freq: Four times a day (QID) | RECTAL | Status: DC | PRN

## 2020-03-08 MED ORDER — SODIUM CHLORIDE 0.9 % IV SOLN
1.0000 g | INTRAVENOUS | Status: DC
  Administered 2020-03-08: 1 g via INTRAVENOUS
  Filled 2020-03-08 (×2): qty 10

## 2020-03-08 MED ORDER — HEPARIN SODIUM (PORCINE) 5000 UNIT/ML IJ SOLN
5000.0000 [IU] | Freq: Three times a day (TID) | INTRAMUSCULAR | Status: DC
  Administered 2020-03-09 – 2020-03-12 (×9): 5000 [IU] via SUBCUTANEOUS
  Filled 2020-03-08 (×8): qty 1

## 2020-03-08 MED ORDER — MORPHINE SULFATE (PF) 4 MG/ML IV SOLN
4.0000 mg | Freq: Once | INTRAVENOUS | Status: AC
  Administered 2020-03-08: 4 mg via INTRAVENOUS
  Filled 2020-03-08: qty 1

## 2020-03-08 MED ORDER — SODIUM CHLORIDE 0.9 % IV BOLUS
1000.0000 mL | Freq: Once | INTRAVENOUS | Status: AC
  Administered 2020-03-08: 1000 mL via INTRAVENOUS

## 2020-03-08 MED ORDER — PANTOPRAZOLE SODIUM 40 MG PO TBEC
40.0000 mg | DELAYED_RELEASE_TABLET | Freq: Every day | ORAL | Status: DC
  Administered 2020-03-09 – 2020-03-12 (×4): 40 mg via ORAL
  Filled 2020-03-08 (×4): qty 1

## 2020-03-08 MED ORDER — ONDANSETRON HCL 4 MG PO TABS: 4.0000 mg | ORAL_TABLET | Freq: Four times a day (QID) | ORAL | Status: DC | PRN

## 2020-03-08 MED ORDER — ACETAMINOPHEN 325 MG PO TABS
650.0000 mg | ORAL_TABLET | Freq: Four times a day (QID) | ORAL | Status: DC | PRN
  Administered 2020-03-08 – 2020-03-09 (×2): 650 mg via ORAL
  Filled 2020-03-08 (×2): qty 2

## 2020-03-08 MED ORDER — BUDESONIDE 3 MG PO CPEP
9.0000 mg | ORAL_CAPSULE | Freq: Every day | ORAL | Status: AC
  Administered 2020-03-09 – 2020-03-11 (×3): 9 mg via ORAL
  Filled 2020-03-08 (×3): qty 3

## 2020-03-08 MED ORDER — LABETALOL HCL 5 MG/ML IV SOLN: 10.0000 mg | INTRAVENOUS | Status: DC | PRN

## 2020-03-08 MED ORDER — MELATONIN 5 MG PO TABS
10.0000 mg | ORAL_TABLET | Freq: Every day | ORAL | Status: DC
  Administered 2020-03-08 – 2020-03-12 (×5): 10 mg via ORAL
  Filled 2020-03-08 (×5): qty 2

## 2020-03-08 MED ORDER — SODIUM CHLORIDE 0.9 % IV SOLN: 1.0000 g | Freq: Once | INTRAVENOUS | Status: DC

## 2020-03-08 MED ORDER — CYCLOSPORINE 0.05 % OP EMUL
1.0000 [drp] | Freq: Two times a day (BID) | OPHTHALMIC | Status: DC
  Administered 2020-03-09 – 2020-03-12 (×7): 1 [drp] via OPHTHALMIC
  Filled 2020-03-08 (×8): qty 1

## 2020-03-08 MED ORDER — POTASSIUM CHLORIDE CRYS ER 20 MEQ PO TBCR
40.0000 meq | EXTENDED_RELEASE_TABLET | Freq: Once | ORAL | Status: AC
  Administered 2020-03-08: 40 meq via ORAL
  Filled 2020-03-08: qty 2

## 2020-03-08 MED ORDER — ONDANSETRON HCL 4 MG/2ML IJ SOLN: 4.0000 mg | Freq: Four times a day (QID) | INTRAMUSCULAR | Status: DC | PRN

## 2020-03-08 MED ORDER — HYDROCODONE-ACETAMINOPHEN 5-325 MG PO TABS
1.0000 | ORAL_TABLET | Freq: Four times a day (QID) | ORAL | Status: DC | PRN
  Administered 2020-03-10 – 2020-03-11 (×2): 1 via ORAL
  Filled 2020-03-08 (×2): qty 1

## 2020-03-08 MED ORDER — SODIUM CHLORIDE 0.9 % IV SOLN: INTRAVENOUS | Status: AC

## 2020-03-08 MED ORDER — ZOLPIDEM TARTRATE 5 MG PO TABS: 5.0000 mg | ORAL_TABLET | Freq: Every evening | ORAL | Status: DC | PRN

## 2020-03-08 NOTE — ED Provider Notes (Signed)
[facility] DEPART[facility] Provider Note   CSN: [id number] Arrival date & time: 03/08/20  1210     History Chief Complaint  Patient presents with  . Abdominal Pain  . Emesis    Loretta Austin is a 74 y.o. female with a past medical history significant for diverticulosis, cholecystolithiasis, Sjogren's diease, and GERD who presents to the ED due to diffuse abdominal pain associated with nausea, vomiting, and diarrhea since Sunday.  Patient states pain started after eating from a [place] food bar at [employer]. Describes pain as a "discomfort". Admits to numerous episodes of non-bloody diarrhea. No emesis today. One episode of non-bloody, non-bilious emesis yesterday. Admits to feeling feverish. No documented fever. She had a tubal ligation. No other abdominal operations.  No aggravating or alleviating factors.  Patient is unsure if she has dysuria.  Denies vaginal symptoms.  She has not tried anything for pain prior to arrival. Denies chest pain and shortness of breath.   Patient also admits to falling last night when she lost her balance where she sustained skin tears to her left elbow, right shin, and right toe. Denies significant pain. Denies head injury. No LOC. She is not currently on any blood thinners.   History obtained from patient and past medical records. No interpreter used during encounter.      Past Medical History:  Diagnosis Date  . Arthritis   . Cholecystolithiasis   . Colon polyps   . Diverticulosis   . GERD (gastroesophageal reflux disease)   . High blood pressure   . Microscopic colitis   . Sjogren's disease ([place])   . Sleep apnea     Patient Active Problem List   Diagnosis Date Noted  . Hyponatremia 03/08/2020  . Microscopic colitis   . Hypokalemia   . Renal insufficiency   . Combined abdominal pain, vomiting, and diarrhea   . Hypertension   . Obstructive sleep apnea 04/25/2018  . Lung nodule < 6cm on CT 04/25/2018  . Cough  04/25/2018    Past Surgical History:  Procedure Laterality Date  . TONSILLECTOMY AND ADENOIDECTOMY    . TUBAL LIGATION       OB History   No obstetric history on file.     Family History  Problem Relation Age of Onset  . Lung cancer Mother   . Colon cancer Mother     Social History   Tobacco Use  . Smoking status: Former Smoker    Types: Cigarettes  . Smokeless tobacco: Never Used  Vaping Use  . Vaping Use: Never used  Substance Use Topics  . Alcohol use: Yes    Comment: occasional  . Drug use: Never    Home Medications Prior to Admission medications   Medication Sig Start Date End Date Taking? Authorizing Provider  budesonide (ENTOCORT EC) 3 MG 24 hr capsule TAKE 1 CAPSULE DAILY Patient taking differently: Take 3 mg by mouth daily.  05/31/19  Yes Danis, Kirke Corin, MD  calcium citrate-vitamin D (CITRACAL+D) 315-200 MG-UNIT tablet Take 1 tablet by mouth daily.   Yes [provider]  DEXILANT 60 MG capsule TAKE 1 CAPSULE DAILY Patient taking differently: Take 60 mg by mouth daily.  05/12/19  Yes Danis, Kirke Corin, MD  folic acid (FOLVITE) 762 MCG tablet Take 800 mcg by mouth daily.   Yes [provider]  HUMIRA 40 MG/0.4ML PSKT Inject 1 Syringe as directed every 14 (fourteen) days. 03/31/18  Yes [provider]  ipratropium (ATROVENT)  0.03 % nasal spray Place 2 sprays into both nostrils as needed.    Yes [provider]  losartan-hydrochlorothiazide (HYZAAR) 50-12.5 MG tablet Take 1 tablet by mouth daily. 03/16/18  Yes [provider]  Melatonin 10 MG TABS Take 10 mg by mouth daily.   Yes [provider]  Multiple Vitamins-Iron (MULTIVITAMIN/IRON PO) Take 1 tablet by mouth daily.   Yes [provider]  Probiotic Product (PROBIOTIC DAILY PO) Take 1 capsule by mouth daily. Generic Align    Yes [provider]  RESTASIS 0.05 % ophthalmic emulsion Place 1 drop into both eyes 2 (two) times daily.  01/16/18   Yes [provider]  vitamin B-12 (CYANOCOBALAMIN) 1000 MCG tablet Take 1,000 mcg by mouth daily.   Yes [provider]  zaleplon (SONATA) 5 MG capsule Take 5 mg by mouth at bedtime as needed for sleep.   Yes [provider]    Allergies    Ciprofloxacin and Flagyl [metronidazole]  Review of Systems   Review of Systems  Constitutional: Positive for fever (feels feverish). Negative for chills.  Respiratory: Negative for shortness of breath.   Cardiovascular: Negative for chest pain.  Gastrointestinal: Positive for abdominal pain, diarrhea, nausea and vomiting.  Genitourinary: Negative for dysuria and vaginal discharge.  Musculoskeletal: Negative for back pain.  All other systems reviewed and are negative.   Physical Exam Updated Vital Signs BP 125/80   Pulse 87   Temp 100.1 F (37.8 C) (Rectal)   Resp 16   Ht 5' 5" (1.651 m)   Wt 73.5 kg   SpO2 100%   BMI 26.96 kg/m   Physical Exam Vitals and nursing note reviewed.  Constitutional:      General: She is not in acute distress.    Appearance: She is not toxic-appearing.  HENT:     Head: Normocephalic.  Eyes:     Pupils: Pupils are equal, round, and reactive to light.  Cardiovascular:     Rate and Rhythm: Normal rate and regular rhythm.     Pulses: Normal pulses.     Heart sounds: Normal heart sounds. No murmur heard.  No friction rub. No gallop.   Pulmonary:     Effort: Pulmonary effort is normal.     Breath sounds: Normal breath sounds.  Abdominal:     General: Abdomen is flat. Bowel sounds are normal. There is no distension.     Palpations: Abdomen is soft.     Tenderness: There is abdominal tenderness. There is no right CVA tenderness, left CVA tenderness, guarding or rebound.     Comments: Diffuse abdominal tenderness most significant epigastric and left lower quadrant.  No guarding or rebound.  Negative CVA tenderness bilaterally.  Musculoskeletal:     Cervical back: Neck supple.      Comments: Able to move all 4 extremities without difficulty.  Skin:    General: Skin is warm and dry.     Comments: Superficial skin tears to left elbow, right shin, and right toe.   Neurological:     General: No focal deficit present.     Mental Status: She is alert.  Psychiatric:        Mood and Affect: Mood normal.        Behavior: Behavior normal.     ED Results / Procedures / Treatments   Labs (all labs ordered are listed, but only abnormal results are displayed) Labs Reviewed  COMPREHENSIVE METABOLIC PANEL - Abnormal; Notable for the following components:        Result Value   Sodium 124 (*)    Potassium 3.1 (*)    Chloride 87 (*)    Glucose, Bld 148 (*)    BUN 32 (*)    Creatinine, Ser 2.25 (*)    Calcium 8.6 (*)    Albumin 2.4 (*)    Alkaline Phosphatase 154 (*)    Total Bilirubin 1.4 (*)    GFR calc non Af Amer 21 (*)    GFR calc Af Amer 24 (*)    All other components within normal limits  CBC - Abnormal; Notable for the following components:   WBC 27.8 (*)    All other components within normal limits  URINALYSIS, ROUTINE W REFLEX MICROSCOPIC - Abnormal; Notable for the following components:   Color, Urine AMBER (*)    APPearance CLOUDY (*)    Hgb urine dipstick LARGE (*)    Protein, ur 100 (*)    Leukocytes,Ua LARGE (*)    WBC, UA >50 (*)    Bacteria, UA MANY (*)    Non Squamous Epithelial 0-5 (*)    All other components within normal limits  CULTURE, BLOOD (ROUTINE X 2)  CULTURE, BLOOD (ROUTINE X 2)  SARS CORONAVIRUS 2 BY RT PCR (HOSPITAL ORDER, PERFORMED IN [facility])  URINE CULTURE  LIPASE, BLOOD  LACTIC ACID, PLASMA    EKG None  Radiology CT ABDOMEN PELVIS WO CONTRAST  Result Date: 03/08/2020 CLINICAL DATA:  Nausea, vomiting, diarrhea, lower abdominal pain EXAM: CT ABDOMEN AND PELVIS WITHOUT CONTRAST TECHNIQUE: Multidetector CT imaging of the abdomen and pelvis was performed following the standard protocol without IV contrast.  COMPARISON:  None. FINDINGS: Lower chest: No acute pleural or parenchymal lung disease. Hepatobiliary: Large calcified gallstone without cholecystitis. Unenhanced imaging of the liver is unremarkable. Pancreas: Unremarkable. No pancreatic ductal dilatation or surrounding inflammatory changes. Spleen: Normal in size without focal abnormality. Adrenals/Urinary Tract: No urinary tract calculi or obstructive uropathy. The adrenals are unremarkable. The bladder is normal. Stomach/Bowel: No bowel obstruction or ileus. Distal colonic diverticulosis without diverticulitis. Normal appendix right lower quadrant. Vascular/Lymphatic: Aortic atherosclerosis. No enlarged abdominal or pelvic lymph nodes. Reproductive: Uterus and bilateral adnexa are unremarkable. Other: No abdominal wall hernia or abnormality. No abdominopelvic ascites. Musculoskeletal: No acute or destructive bony lesions. Reconstructed images demonstrate no additional findings. IMPRESSION: 1. Cholelithiasis without cholecystitis. 2. Distal colonic diverticulosis without diverticulitis. 3. Aortic Atherosclerosis (ICD10-I70.0). Electronically Signed   By: Michael  Brown M.D.   On: 03/08/2020 19:02    Procedures Procedures (including critical care time)  Medications Ordered in ED Medications  sodium chloride 0.9 % bolus 1,000 mL (1,000 mLs Intravenous New Bag/Given 03/08/20 1625)  potassium chloride SA (KLOR-CON) CR tablet 40 mEq (40 mEq Oral Given 03/08/20 1648)  morphine 4 MG/ML injection 4 mg (4 mg Intravenous Given 03/08/20 1650)    ED Course  I have reviewed the triage vital signs and the nursing notes.  Pertinent labs & imaging results that were available during my care of the patient were reviewed by me and considered in my medical decision making (see chart for details).  Clinical Course as of Mar 08 1937  Thu Mar 08, 2020  1628 Sodium(!): 124 [CA]  1628 Potassium(!): 3.1 [CA]  1628 WBC(!): 27.8 [CA]  1628 Creatinine(!): 2.25 [CA]  1628  BUN(!): 32 [CA]  1628 Alkaline Phosphatase(!): 154 [CA]  1628 Leukocytes,Ua(!): LARGE [CA]  1628 Bacteria, UA(!): MANY [CA]  1757 Unable to get CT with contrast due to creatinine. Will get   CT w/o   [CA]  1809 Lactic Acid, Venous: 1.3 [CA]    Clinical Course User Index [CA] ,  C, PA-C   MDM Rules/Calculators/A&P                         74-year-old female presents to the ED due to abdominal pain associate with nausea, vomiting, diarrhea.  Upon arrival, vitals all within normal limits.  Patient is afebrile, not tachycardic or hypoxic.  Patient in no acute distress and nontoxic-appearing.  Physical exam significant for generalized abdominal tenderness most significant epigastric and left lower quadrant.  No rebound or guarding.  Negative CVA tenderness bilaterally.  Routine labs and UA ordered at triage.  Will obtain CT abdomen given leukocytosis at 27.8. Patient deferred x-rays of left elbow, right leg, and right foot which I find to be reasonable given suspicion of fracture is low. She has full ROM with mild tenderness around skin tears. Wounds thoroughly cleaned at bedside and bandaged.   CBC significant for leukocytosis at 27.8, but otherwise normal.  Lipase normal at 12.  Doubt pancreatitis.  CMP significant for hypokalemia at 3.1, hyponatremia 124, AKI with creatinine at 2.25 and BUN at 32 (no baseline labs), and elevated alk phos at 154.  Potassium repleted here in the ED.  IV fluids given.   CT abdomen personally reviewed which demonstrates: IMPRESSION:  1. Cholelithiasis without cholecystitis.  2. Distal colonic diverticulosis without diverticulitis.  3. Aortic Atherosclerosis (ICD10-I70.0).   Will consult hospitalist for admission given hyponatremia. Discussed case with Dr. Lockwood who evaluated patient at bedside and agrees with assessment and plan. IV rocephin started for UTI. COVID test pending.   Discussed case with Dr. Opyd who agrees to admit patient for further  treatment. Will hold abx until he evaluates patient.  Final Clinical Impression(s) / ED Diagnoses Final diagnoses:  Hyponatremia    Rx / DC Orders ED Discharge Orders    None       ,  C, PA-C 03/08/20 1938    Lockwood, Robert, MD 03/08/20 2237  

## 2020-03-08 NOTE — ED Notes (Signed)
RN attempted report x1.  

## 2020-03-08 NOTE — ED Notes (Signed)
Pt transported to CT ?

## 2020-03-08 NOTE — H&P (Signed)
History and Physical    Loretta Austin UGQ:[id number] DOB: 10/14/1945 DOA: 03/08/2020  PCP: [facility]   Patient coming from: Home   Chief Complaint: Abdominal pain, chills, anorexia, diarrhea   HPI: Loretta Austin is a 74 y.o. female with medical history significant for hypertension, rheumatoid arthritis on Humira, microscopic colitis on maintenance budesonide, OSA, now presenting to emergency department for evaluation of abdominal pain, chills, loss of appetite, and diarrhea.  Patient reports that she been in her usual state of health until 03/04/2020 when she developed generalized abdominal discomfort and watery diarrhea.  She also went on to experience nausea and has had a couple episodes of nonbloody vomiting.  She reports chills and malaise with this illness.  She denies dysuria but has new onset of bilateral flank pain.  She denies cough, shortness of breath, rhinorrhea, or sore throat.  ED Course: Upon arrival to the ED, patient is found to be afebrile, saturating well on room air, and with stable blood pressure.  Chemistry panel notable for sodium 124, potassium 3.1, and creatinine 2.25 with no priors available for comparison.  CBC features a leukocytosis to 27,800.  Lactic acid is normal.  Urinalysis compatible with possible infection.  CT the abdomen and pelvis without acute findings.  Blood cultures were collected in the ED and the patient was given a liter of saline, 40 mEq potassium, morphine, and Rocephin.  Covid PCR screening test is pending.  Review of Systems:  All other systems reviewed and apart from HPI, are negative.  Past Medical History:  Diagnosis Date  . Arthritis   . Cholecystolithiasis   . Colon polyps   . Diverticulosis   . GERD (gastroesophageal reflux disease)   . High blood pressure   . Microscopic colitis   . Sjogren's disease ([place])   . Sleep apnea     Past Surgical History:  Procedure Laterality Date  . TONSILLECTOMY AND  ADENOIDECTOMY    . TUBAL LIGATION       reports that she has quit smoking. Her smoking use included cigarettes. She has never used smokeless tobacco. She reports current alcohol use. She reports that she does not use drugs.  Allergies  Allergen Reactions  . Ciprofloxacin   . Flagyl [Metronidazole]     Family History  Problem Relation Age of Onset  . Lung cancer Mother   . Colon cancer Mother      Prior to Admission medications   Medication Sig Start Date End Date Taking? Authorizing Provider  budesonide (ENTOCORT EC) 3 MG 24 hr capsule TAKE 1 CAPSULE DAILY Patient taking differently: Take 3 mg by mouth daily.  05/31/19  Yes Danis, Kirke Corin, MD  calcium citrate-vitamin D (CITRACAL+D) 315-200 MG-UNIT tablet Take 1 tablet by mouth daily.   Yes [provider]  DEXILANT 60 MG capsule TAKE 1 CAPSULE DAILY Patient taking differently: Take 60 mg by mouth daily.  05/12/19  Yes Danis, Kirke Corin, MD  folic acid (FOLVITE) 882 MCG tablet Take 800 mcg by mouth daily.   Yes [provider]  HUMIRA 40 MG/0.4ML PSKT Inject 1 Syringe as directed every 14 (fourteen) days. 03/31/18  Yes [provider]  ipratropium (ATROVENT) 0.03 % nasal spray Place 2 sprays into both nostrils as needed.    Yes [provider]  losartan-hydrochlorothiazide (HYZAAR) 50-12.5 MG tablet Take 1 tablet by mouth daily. 03/16/18  Yes [provider]  Melatonin 10 MG TABS Take 10 mg by mouth daily.   Yes  [provider]  Multiple Vitamins-Iron (MULTIVITAMIN/IRON PO) Take 1 tablet by mouth daily.   Yes [provider]  Probiotic Product (PROBIOTIC DAILY PO) Take 1 capsule by mouth daily. Generic Align    Yes [provider]  RESTASIS 0.05 % ophthalmic emulsion Place 1 drop into both eyes 2 (two) times daily.  01/16/18  Yes [provider]  vitamin B-12 (CYANOCOBALAMIN) 1000 MCG tablet Take 1,000 mcg by mouth daily.   Yes [provider]    zaleplon (SONATA) 5 MG capsule Take 5 mg by mouth at bedtime as needed for sleep.   Yes [provider]    Physical Exam: Vitals:   03/08/20 1759 03/08/20 1814 03/08/20 1954 03/08/20 2000  BP: 125/80  (!) 147/62 (!) 150/122  Pulse: 86 87 91 93  Resp: 20 16 17  (!) 24  Temp:      TempSrc:      SpO2: 99% 100% 98% 100%  Weight:      Height:        Constitutional: NAD, calm  Eyes: PERTLA, lids and conjunctivae normal ENMT: Mucous membranes are moist. Posterior pharynx clear of any exudate or lesions.   Neck: normal, supple, no masses, no thyromegaly Respiratory: no wheezing, no crackles. No accessory muscle use.  Cardiovascular: S1 & S2 heard, regular rate and rhythm. No extremity edema.   Abdomen: No distension, soft, tender generally without rebound pain or guarding. Bowel sounds active.  Musculoskeletal: no clubbing / cyanosis. No joint deformity upper and lower extremities.   Skin: no significant rashes, lesions, ulcers. Warm, dry, well-perfused. Neurologic: CN 2-12 grossly intact. Sensation intact. Moving all extremities.  Psychiatric: Alert and oriented to person, place, and situation. Pleasant and cooperative.    Labs and Imaging on Admission: I have personally reviewed following labs and imaging studies  CBC: Recent Labs  Lab 03/08/20 1230  WBC 27.8*  HGB 12.9  HCT 37.7  MCV 90.4  PLT 161   Basic Metabolic Panel: Recent Labs  Lab 03/08/20 1230  NA 124*  K 3.1*  CL 87*  CO2 23  GLUCOSE 148*  BUN 32*  CREATININE 2.25*  CALCIUM 8.6*   GFR: Estimated Creatinine Clearance: 22.4 mL/min (A) (by C-G formula based on SCr of 2.25 mg/dL (H)). Liver Function Tests: Recent Labs  Lab 03/08/20 1230  AST 34  ALT 32  ALKPHOS 154*  BILITOT 1.4*  PROT 6.5  ALBUMIN 2.4*   Recent Labs  Lab 03/08/20 1230  LIPASE 12   No results for input(s): AMMONIA in the last 168 hours. Coagulation Profile: No results for input(s): INR, PROTIME in the last 168  hours. Cardiac Enzymes: No results for input(s): CKTOTAL, CKMB, CKMBINDEX, TROPONINI in the last 168 hours. BNP (last 3 results) No results for input(s): PROBNP in the last 8760 hours. HbA1C: No results for input(s): HGBA1C in the last 72 hours. CBG: No results for input(s): GLUCAP in the last 168 hours. Lipid Profile: No results for input(s): CHOL, HDL, LDLCALC, TRIG, CHOLHDL, LDLDIRECT in the last 72 hours. Thyroid Function Tests: No results for input(s): TSH, T4TOTAL, FREET4, T3FREE, THYROIDAB in the last 72 hours. Anemia Panel: No results for input(s): VITAMINB12, FOLATE, FERRITIN, TIBC, IRON, RETICCTPCT in the last 72 hours. Urine analysis:    Component Value Date/Time   COLORURINE AMBER (A) 03/08/2020 1226   APPEARANCEUR CLOUDY (A) 03/08/2020 1226   LABSPEC 1.008 03/08/2020 1226   PHURINE 5.0 03/08/2020 1226   GLUCOSEU NEGATIVE 03/08/2020 1226   HGBUR LARGE (A)  03/08/2020 [place] 03/08/2020 1226   KETONESUR NEGATIVE 03/08/2020 1226   PROTEINUR 100 (A) 03/08/2020 1226   NITRITE NEGATIVE 03/08/2020 1226   LEUKOCYTESUR LARGE (A) 03/08/2020 1226   Sepsis Labs: @LABRCNTIP (procalcitonin:4,lacticidven:4) )No results found for this or any previous visit (from the past 240 hour(s)).   Radiological Exams on Admission: CT ABDOMEN PELVIS WO CONTRAST  Result Date: 03/08/2020 CLINICAL DATA:  Nausea, vomiting, diarrhea, lower abdominal pain EXAM: CT ABDOMEN AND PELVIS WITHOUT CONTRAST TECHNIQUE: Multidetector CT imaging of the abdomen and pelvis was performed following the standard protocol without IV contrast. COMPARISON:  None. FINDINGS: Lower chest: No acute pleural or parenchymal lung disease. Hepatobiliary: Large calcified gallstone without cholecystitis. Unenhanced imaging of the liver is unremarkable. Pancreas: Unremarkable. No pancreatic ductal dilatation or surrounding inflammatory changes. Spleen: Normal in size without focal abnormality. Adrenals/Urinary  Tract: No urinary tract calculi or obstructive uropathy. The adrenals are unremarkable. The bladder is normal. Stomach/Bowel: No bowel obstruction or ileus. Distal colonic diverticulosis without diverticulitis. Normal appendix right lower quadrant. Vascular/Lymphatic: Aortic atherosclerosis. No enlarged abdominal or pelvic lymph nodes. Reproductive: Uterus and bilateral adnexa are unremarkable. Other: No abdominal wall hernia or abnormality. No abdominopelvic ascites. Musculoskeletal: No acute or destructive bony lesions. Reconstructed images demonstrate no additional findings. IMPRESSION: 1. Cholelithiasis without cholecystitis. 2. Distal colonic diverticulosis without diverticulitis. 3. Aortic Atherosclerosis (ICD10-I70.0). Electronically Signed   By: Randa Ngo M.D.   On: 03/08/2020 19:02    Assessment/Plan   1. Abdominal pain with diarrhea  - Presents with 5 days of abdominal pain, b/l flank pain, chills, anorexia, and is found to have marked leukocytosis with normal lactate and no acute findings on CT abd/pelvis  - She has hx of microscopic colitis but reports that sxs had been well-controlled with no diarrhea until this week  - Check C diff and GI pathogen panel, continue IVF hydration, monitor electrolytes, enteric precautions for now    2. Suspected UTI  - Presents with abdominal pain, bilateral flank pain, chills, and diarrhea, and has urinalysis compatible with possible infection  - Blood cultures collected in ED, will add-on urine culture and start empiric Rocephin    3. Renal insufficiency; hyponatremia  - SCr is 2.25 on admission with no prior labs available  - Patient unaware of any history of kidney disease  - There is no obstructive uropathy on CT in ED  - With recent diarrhea, anorexia, and continued losartan-HCTZ use, this could certainly be acute  - Check FENa, renally-dose medications, continue IVF hydration, and repeat chem panel in am    4. Hypertension  - BP at goal   - Hold losartan-HCTZ as above and treat as-needed only for now    5. Microscopic colitis  - Managed with Budesonide 3 mg daily, will increase for now in setting of acute illness  - Rule-out C diff as above   6. Rheumatoid arthritis  - Stable, managed with Humira   7. OSA  - CPAP qHS   8. Hyponatremia  - Serum sodium is 124 in setting of hypovolemia and HCTZ use  - Continue IVF hydration, hold HCTZ, repeat chem panel in am    DVT prophylaxis: sq heparin  Code Status: Full  Family Communication: Discussed with patient  Disposition Plan:  Patient is from: Home  Anticipated d/c is to: TBD Anticipated d/c date is: 03/11/20 Patient currently: Pending further evaluation & treatment of suspected infection and AKI  Consults called: None  Admission status: Inpatient  Vianne Bulls, MD [facility] Pager: See [email or web address]  If 7AM-7PM, please contact the daytime attending [email or web address]  03/08/2020, 8:15 PM

## 2020-03-08 NOTE — ED Triage Notes (Signed)
Pt here for eval of lower abdominal pain, n/v/d since Sunday, onset <2 hours after eating lunch.

## 2020-03-09 LAB — CBC WITH DIFFERENTIAL/PLATELET
Abs Immature Granulocytes: 1 10*3/uL — ABNORMAL HIGH (ref 0.00–0.07)
Basophils Absolute: 0.1 10*3/uL (ref 0.0–0.1)
Basophils Relative: 0 %
Eosinophils Absolute: 0.1 10*3/uL (ref 0.0–0.5)
Eosinophils Relative: 0 %
HCT: 31.3 % — ABNORMAL LOW (ref 36.0–46.0)
Hemoglobin: 10.6 g/dL — ABNORMAL LOW (ref 12.0–15.0)
Immature Granulocytes: 4 %
Lymphocytes Relative: 2 %
Lymphs Abs: 0.6 10*3/uL — ABNORMAL LOW (ref 0.7–4.0)
MCH: 31 pg (ref 26.0–34.0)
MCHC: 33.9 g/dL (ref 30.0–36.0)
MCV: 91.5 fL (ref 80.0–100.0)
Monocytes Absolute: 2.2 10*3/uL — ABNORMAL HIGH (ref 0.1–1.0)
Monocytes Relative: 8 %
Neutro Abs: 22.6 10*3/uL — ABNORMAL HIGH (ref 1.7–7.7)
Neutrophils Relative %: 86 %
Platelets: 148 10*3/uL — ABNORMAL LOW (ref 150–400)
RBC: 3.42 MIL/uL — ABNORMAL LOW (ref 3.87–5.11)
RDW: 13 % (ref 11.5–15.5)
WBC: 26.6 10*3/uL — ABNORMAL HIGH (ref 4.0–10.5)
nRBC: 0 % (ref 0.0–0.2)

## 2020-03-09 LAB — HEPATIC FUNCTION PANEL
ALT: 25 U/L (ref 0–44)
AST: 26 U/L (ref 15–41)
Albumin: 1.9 g/dL — ABNORMAL LOW (ref 3.5–5.0)
Alkaline Phosphatase: 130 U/L — ABNORMAL HIGH (ref 38–126)
Bilirubin, Direct: 0.4 mg/dL — ABNORMAL HIGH (ref 0.0–0.2)
Indirect Bilirubin: 0.6 mg/dL (ref 0.3–0.9)
Total Bilirubin: 1 mg/dL (ref 0.3–1.2)
Total Protein: 5.3 g/dL — ABNORMAL LOW (ref 6.5–8.1)

## 2020-03-09 LAB — BLOOD CULTURE ID PANEL (REFLEXED)

## 2020-03-09 LAB — BASIC METABOLIC PANEL
Anion gap: 11 (ref 5–15)
BUN: 35 mg/dL — ABNORMAL HIGH (ref 8–23)
CO2: 22 mmol/L (ref 22–32)
Calcium: 8 mg/dL — ABNORMAL LOW (ref 8.9–10.3)
Chloride: 95 mmol/L — ABNORMAL LOW (ref 98–111)
Creatinine, Ser: 2.75 mg/dL — ABNORMAL HIGH (ref 0.44–1.00)
GFR calc Af Amer: 19 mL/min — ABNORMAL LOW (ref >60)
GFR calc non Af Amer: 16 mL/min — ABNORMAL LOW (ref >60)
Glucose, Bld: 117 mg/dL — ABNORMAL HIGH (ref 70–99)
Potassium: 2.9 mmol/L — ABNORMAL LOW (ref 3.5–5.1)
Sodium: 128 mmol/L — ABNORMAL LOW (ref 135–145)

## 2020-03-09 LAB — MAGNESIUM: Magnesium: 2.1 mg/dL (ref 1.7–2.4)

## 2020-03-09 MED ORDER — FOLIC ACID 1 MG PO TABS
1000.0000 ug | ORAL_TABLET | Freq: Every day | ORAL | Status: DC
  Administered 2020-03-09 – 2020-03-12 (×4): 1 mg via ORAL
  Filled 2020-03-09 (×4): qty 1

## 2020-03-09 MED ORDER — VITAMIN B-12 1000 MCG PO TABS
1000.0000 ug | ORAL_TABLET | Freq: Every day | ORAL | Status: DC
  Administered 2020-03-09 – 2020-03-12 (×4): 1000 ug via ORAL
  Filled 2020-03-09 (×4): qty 1

## 2020-03-09 MED ORDER — POTASSIUM CHLORIDE 10 MEQ/100ML IV SOLN
10.0000 meq | INTRAVENOUS | Status: AC
  Administered 2020-03-09 (×3): 10 meq via INTRAVENOUS
  Filled 2020-03-09 (×3): qty 100

## 2020-03-09 MED ORDER — RISAQUAD PO CAPS
2.0000 | ORAL_CAPSULE | Freq: Every day | ORAL | Status: DC
  Administered 2020-03-09 – 2020-03-12 (×4): 2 via ORAL
  Filled 2020-03-09 (×4): qty 2

## 2020-03-09 MED ORDER — SODIUM CHLORIDE 0.9 % IV SOLN
2.0000 g | INTRAVENOUS | Status: AC
  Administered 2020-03-09 – 2020-03-11 (×3): 2 g via INTRAVENOUS
  Filled 2020-03-09 (×3): qty 2

## 2020-03-09 MED ORDER — IPRATROPIUM BROMIDE 0.03 % NA SOLN
2.0000 | NASAL | Status: DC | PRN
  Filled 2020-03-09: qty 30

## 2020-03-09 MED ORDER — POTASSIUM CHLORIDE 10 MEQ/100ML IV SOLN
10.0000 meq | Freq: Once | INTRAVENOUS | Status: AC
  Administered 2020-03-09: 10 meq via INTRAVENOUS
  Filled 2020-03-09: qty 100

## 2020-03-09 MED ORDER — POTASSIUM CHLORIDE CRYS ER 20 MEQ PO TBCR
40.0000 meq | EXTENDED_RELEASE_TABLET | Freq: Once | ORAL | Status: AC
  Administered 2020-03-09: 40 meq via ORAL
  Filled 2020-03-09: qty 2

## 2020-03-09 MED ORDER — CALCIUM CITRATE 950 (200 CA) MG PO TABS
1.0000 | ORAL_TABLET | Freq: Every day | ORAL | Status: DC
  Administered 2020-03-09 – 2020-03-12 (×4): 200 mg via ORAL
  Filled 2020-03-09 (×4): qty 1

## 2020-03-09 NOTE — Progress Notes (Signed)
Patient refused CPAP tonight.  Patient stated to RN that she has not needed it for a long time due to weight loss.  Will continue to monitor.

## 2020-03-09 NOTE — Progress Notes (Signed)
PHARMACY - PHYSICIAN COMMUNICATION CRITICAL VALUE ALERT - BLOOD CULTURE IDENTIFICATION (BCID)  Loretta Austin is an 74 y.o. female who presented to [facility] on 03/08/2020 with a chief complaint of abd pain, chills  Assessment:  74 yo with a hx of colitis who was admitted for symptoms above. She was started on ceftriaxone for UTI. Lab called with BCID today>>3/4 bottles with e.coli. We will increase the ceftriaxone dose to 2g.   Name of physician (or Provider) Contacted: Dr. Louanne Belton  Current antibiotics: Ceftriaxone  Changes to prescribed antibiotics recommended:  Increase dose to 2g IV q24  Results for orders placed or performed during the hospital encounter of 03/08/20  Blood Culture ID Panel (Reflexed) (Collected: 03/08/2020  4:24 PM)  Result Value Ref Range   Enterococcus species NOT DETECTED NOT DETECTED   Listeria monocytogenes NOT DETECTED NOT DETECTED   Staphylococcus species NOT DETECTED NOT DETECTED   Staphylococcus aureus (BCID) NOT DETECTED NOT DETECTED   Streptococcus species NOT DETECTED NOT DETECTED   Streptococcus agalactiae NOT DETECTED NOT DETECTED   Streptococcus pneumoniae NOT DETECTED NOT DETECTED   Streptococcus pyogenes NOT DETECTED NOT DETECTED   Acinetobacter baumannii NOT DETECTED NOT DETECTED   Enterobacteriaceae species DETECTED (A) NOT DETECTED   Enterobacter cloacae complex NOT DETECTED NOT DETECTED   Escherichia coli DETECTED (A) NOT DETECTED   Klebsiella oxytoca NOT DETECTED NOT DETECTED   Klebsiella pneumoniae NOT DETECTED NOT DETECTED   Proteus species NOT DETECTED NOT DETECTED   Serratia marcescens NOT DETECTED NOT DETECTED   Carbapenem resistance NOT DETECTED NOT DETECTED   Haemophilus influenzae NOT DETECTED NOT DETECTED   Neisseria meningitidis NOT DETECTED NOT DETECTED   Pseudomonas aeruginosa NOT DETECTED NOT DETECTED   Candida albicans NOT DETECTED NOT DETECTED   Candida glabrata NOT DETECTED NOT DETECTED   Candida krusei NOT DETECTED  NOT DETECTED   Candida parapsilosis NOT DETECTED NOT DETECTED   Candida tropicalis NOT DETECTED NOT DETECTED    Onnie Boer, PharmD, BCIDP, AAHIVP, CPP Infectious Disease Pharmacist 03/09/2020 9:07 AM

## 2020-03-09 NOTE — Plan of Care (Signed)

## 2020-03-09 NOTE — Progress Notes (Signed)
PROGRESS NOTE  Loretta Austin CBJ:[id number] DOB: 06/19/1946 DOA: 03/08/2020 PCP: [facility]   LOS: 1 day   Brief narrative: As per HPI,  Loretta Austin is a 74 y.o. female with medical history significant for hypertension, rheumatoid arthritis on Humira, microscopic colitis on maintenance budesonide, OSA, presented to emergency department with complains of  abdominal pain, chills, loss of appetite, and diarrhea.  Patient reported that she been in her usual state of health until 03/04/2020 when she developed generalized abdominal discomfort and watery diarrhea.  She also went on to experience nausea and has had a couple episodes of nonbloody vomiting.   Upon arrival to the ED, patient is found to be afebrile, saturating well on room air, and with stable blood pressure.  Chemistry panel notable for sodium 124, potassium 3.1, and creatinine 2.25 with no priors available for comparison.  CBC features a leukocytosis to 27,800.  Lactic acid was normal.  Urinalysis compatible with possible infection.  CT the abdomen and pelvis without acute findings.  Blood cultures were collected in the ED and the patient was given a liter of saline, 40 mEq potassium, morphine, and Rocephin.  Covid PCR screening was negative. Patient was then admitted to the hospital for further evaluation and treatment.  Assessment/Plan:  Principal Problem:   Hyponatremia Active Problems:   Obstructive sleep apnea   Microscopic colitis   Hypokalemia   Renal insufficiency   Combined abdominal pain, vomiting, and diarrhea   Hypertension   Rheumatoid arthritis ([place])   UTI (urinary tract infection) Gram-negative bacteremia  Abdominal pain with nausea vomiting. Patient with 5-day history of abdominal pain anorexia nausea and diarrhea.  Has not had any bowel movements since admission.  Low-grade fever.  Lactic acid was within normal limits.  CT scan of the abdomen and pelvis without acute findings but patient  does have history of microscopic colitis.  Pending C diff and GI pathogen panel but has not had a bowel movement since admission.  Symptomatically slightly improved at this time.  Continue with IV antibiotic.  COVID-19 test was negative.  Hypokalemia   We will continue replacement with IV potassium chloride and p.o. KCl..  Gram-negative rod bacteremia, significant leukocytosis aerobic bottle. Will continue to follow.  She is already on IV Rocephin 2 g daily.  Possible UTI  On IV rocephin, follow urine culture  Renal insufficiency likely acute kidney injury secondary to volume depletion.  - SCr is 2.25 on admission.  Likely acute injury with diarrhea nausea vomiting and use of losartan HCTZ.  No previous labs to compare.  CT scan without any hydronephrosis.  Creatinine of 2.7 today.  Continue with IV fluid hydration.  Medical records have been requested to complete labs.  Ending urinary sodium urinary creatinine.   Hyponatremia  Likely hypovolemic from volume depletion..  Continue IV fluid hydration.  Sodium of 128 today from 124.  Continue normal saline hydration  Essential Hypertension  Hold losartan- HCTZ.  Continue to monitor blood pressure.  History of microscopic colitis  - Managed with Budesonide 3 mg daily, continue steroids for now. Rule-out C diff   History of Rheumatoid arthritis  On Humira as outpatient.  OSA  Continue CPAP at night.  Anemia likely secondary to chronic disease.  Will closely monitor CBC.  DVT prophylaxis: heparin injection 5,000 Units Start: 03/08/20 2200  Code Status: Full code  Family Communication: I spoke with the patient's daughter Ms. Benjie Karvonen on the phone and updated her about the clinical condition of the patient  and answered queries she had  Status is: Inpatient  Remains inpatient appropriate because:Persistent severe electrolyte disturbances, Ongoing diagnostic testing needed not appropriate for outpatient work up, Unsafe d/c plan, IV  treatments appropriate due to intensity of illness or inability to take PO and Inpatient level of care appropriate due to severity of illness, gram-negative bacteremia   Dispo: The patient is from: Home              Anticipated d/c is to: Home              Anticipated d/c date is: 2 days              Patient currently is not medically stable to d/c.  Consultants:  None  Procedures:  None  Antibiotics:  . IV Rocephin 6/17>  Subjective: Today, patient was seen and examined at bedside.  Does not feel well and feels mild, complains of generalized weakness .  Has not had a bowel movement since admission.  Denies current nausea or vomiting  Objective: Vitals:   03/09/20 0123 03/09/20 0443  BP: (!) 97/55 130/62  Pulse: 64 75  Resp: 17 18  Temp: 97.7 F (36.5 C) 98.4 F (36.9 C)  SpO2: 100% 100%    Intake/Output Summary (Last 24 hours) at 03/09/2020 0739 Last data filed at 03/09/2020 0349 Gross per 24 hour  Intake 1926.67 ml  Output --  Net 1926.67 ml   Filed Weights   03/08/20 1216 03/08/20 2242  Weight: 73.5 kg 76.2 kg   Body mass index is 27.96 kg/m.   Physical Exam: GENERAL: Awake and oriented.  Communicative.  Not in obvious distress HENT: No scleral pallor or icterus. Pupils equally reactive to light. Oral mucosa is dry NECK: is supple, no gross swelling noted. CHEST: Clear to auscultation. No crackles or wheezes.  Diminished breath sounds bilaterally. CVS: S1 and S2 heard, no murmur. Regular rate and rhythm.  ABDOMEN: Soft, non-tender, bowel sounds are present. EXTREMITIES: No edema. CNS: Cranial nerves are intact. No focal motor deficits. SKIN: warm and dry without rashes.  Data Review: I have personally reviewed the following laboratory data and studies,  CBC: Recent Labs  Lab 03/08/20 1230 03/09/20 0228  WBC 27.8* 26.6*  NEUTROABS  --  22.6*  HGB 12.9 10.6*  HCT 37.7 31.3*  MCV 90.4 91.5  PLT 187 737*   Basic Metabolic Panel: Recent Labs   Lab 03/08/20 1230 03/09/20 0228  NA 124* 128*  K 3.1* 2.9*  CL 87* 95*  CO2 23 22  GLUCOSE 148* 117*  BUN 32* 35*  CREATININE 2.25* 2.75*  CALCIUM 8.6* 8.0*  MG  --  2.1   Liver Function Tests: Recent Labs  Lab 03/08/20 1230 03/09/20 0228  AST 34 26  ALT 32 25  ALKPHOS 154* 130*  BILITOT 1.4* 1.0  PROT 6.5 5.3*  ALBUMIN 2.4* 1.9*   Recent Labs  Lab 03/08/20 1230  LIPASE 12   No results for input(s): AMMONIA in the last 168 hours. Cardiac Enzymes: No results for input(s): CKTOTAL, CKMB, CKMBINDEX, TROPONINI in the last 168 hours. BNP (last 3 results) No results for input(s): BNP in the last 8760 hours.  ProBNP (last 3 results) No results for input(s): PROBNP in the last 8760 hours.  CBG: No results for input(s): GLUCAP in the last 168 hours. Recent Results (from the past 240 hour(s))  Blood culture (routine x 2)     Status: None (Preliminary result)   Collection Time: 03/08/20  4:24  PM   Specimen: BLOOD  Result Value Ref Range Status   Specimen Description BLOOD RIGHT ANTECUBITAL  Final   Special Requests   Final    BOTTLES DRAWN AEROBIC AND ANAEROBIC Blood Culture results may not be optimal due to an excessive volume of blood received in culture bottles   Culture  Setup Time   Final    IN BOTH AEROBIC AND ANAEROBIC BOTTLES GRAM NEGATIVE RODS Organism ID to follow Performed at [facility], [place] [address], [place], [place] [address]    Culture PENDING  Incomplete   Report Status PENDING  Incomplete  Blood culture (routine x 2)     Status: None (Preliminary result)   Collection Time: 03/08/20  4:34 PM   Specimen: BLOOD RIGHT WRIST  Result Value Ref Range Status   Specimen Description BLOOD RIGHT WRIST  Final   Special Requests AEROBIC BOTTLE ONLY Blood Culture adequate volume  Final   Culture  Setup Time   Final    AEROBIC BOTTLE ONLY GRAM NEGATIVE RODS CRITICAL VALUE NOTED.  VALUE IS CONSISTENT WITH PREVIOUSLY REPORTED AND CALLED  VALUE. Performed at [facility], [place] [address], [place], [place] [address]    Culture PENDING  Incomplete   Report Status PENDING  Incomplete  SARS Coronavirus 2 by RT PCR (hospital order, performed in [facility] hospital lab) Nasopharyngeal Nasopharyngeal Swab     Status: None   Collection Time: 03/08/20  7:53 PM   Specimen: Nasopharyngeal Swab  Result Value Ref Range Status   SARS Coronavirus 2 NEGATIVE NEGATIVE Final    Comment: (NOTE) SARS-CoV-2 target nucleic acids are NOT DETECTED.  The SARS-CoV-2 RNA is generally detectable in upper and lower respiratory specimens during the acute phase of infection. The lowest concentration of SARS-CoV-2 viral copies this assay can detect is 250 copies / mL. A negative result does not preclude SARS-CoV-2 infection and should not be used as the sole basis for treatment or other patient management decisions.  A negative result may occur with improper specimen collection / handling, submission of specimen other than nasopharyngeal swab, presence of viral mutation(s) within the areas targeted by this assay, and inadequate number of viral copies (<250 copies / mL). A negative result must be combined with clinical observations, patient history, and epidemiological information.  Fact Sheet for Patients:   StrictlyIdeas.no  Fact Sheet for Healthcare Providers: BankingDealers.co.za  This test is not yet approved or  cleared by the [place] FDA and has been authorized for detection and/or diagnosis of SARS-CoV-2 by FDA under an Emergency Use Authorization (EUA).  This EUA will remain in effect (meaning this test can be used) for the duration of the COVID-19 declaration under Section 564(b)(1) of the Act, 21 U.S.C. section 360bbb-3(b)(1), unless the authorization is terminated or revoked sooner.  Performed at [facility], [place] [address] [place], [place] [address]       Studies: CT ABDOMEN PELVIS WO CONTRAST  Result Date: 03/08/2020 CLINICAL DATA:  Nausea, vomiting, diarrhea, lower abdominal pain EXAM: CT ABDOMEN AND PELVIS WITHOUT CONTRAST TECHNIQUE: Multidetector CT imaging of the abdomen and pelvis was performed following the standard protocol without IV contrast. COMPARISON:  None. FINDINGS: Lower chest: No acute pleural or parenchymal lung disease. Hepatobiliary: Large calcified gallstone without cholecystitis. Unenhanced imaging of the liver is unremarkable. Pancreas: Unremarkable. No pancreatic ductal dilatation or surrounding inflammatory changes. Spleen: Normal in size without focal abnormality. Adrenals/Urinary Tract: No urinary tract calculi or obstructive uropathy. The adrenals  are unremarkable. The bladder is normal. Stomach/Bowel: No bowel obstruction or ileus. Distal colonic diverticulosis without diverticulitis. Normal appendix right lower quadrant. Vascular/Lymphatic: Aortic atherosclerosis. No enlarged abdominal or pelvic lymph nodes. Reproductive: Uterus and bilateral adnexa are unremarkable. Other: No abdominal wall hernia or abnormality. No abdominopelvic ascites. Musculoskeletal: No acute or destructive bony lesions. Reconstructed images demonstrate no additional findings. IMPRESSION: 1. Cholelithiasis without cholecystitis. 2. Distal colonic diverticulosis without diverticulitis. 3. Aortic Atherosclerosis (ICD10-I70.0). Electronically Signed   By: Randa Ngo M.D.   On: 03/08/2020 19:02      Flora Lipps, MD  [facility] 03/09/2020

## 2020-03-10 LAB — GASTROINTESTINAL PANEL BY PCR, STOOL (REPLACES STOOL CULTURE)

## 2020-03-10 LAB — PHOSPHORUS: Phosphorus: 1.8 mg/dL — ABNORMAL LOW (ref 2.5–4.6)

## 2020-03-10 LAB — CBC
HCT: 31.9 % — ABNORMAL LOW (ref 36.0–46.0)
Hemoglobin: 10.9 g/dL — ABNORMAL LOW (ref 12.0–15.0)
MCH: 31.1 pg (ref 26.0–34.0)
MCHC: 34.2 g/dL (ref 30.0–36.0)
MCV: 90.9 fL (ref 80.0–100.0)
Platelets: 185 10*3/uL (ref 150–400)
RBC: 3.51 MIL/uL — ABNORMAL LOW (ref 3.87–5.11)
RDW: 13.4 % (ref 11.5–15.5)
WBC: 32.4 10*3/uL — ABNORMAL HIGH (ref 4.0–10.5)
nRBC: 0 % (ref 0.0–0.2)

## 2020-03-10 LAB — COMPREHENSIVE METABOLIC PANEL
ALT: 33 U/L (ref 0–44)
AST: 31 U/L (ref 15–41)
Albumin: 1.6 g/dL — ABNORMAL LOW (ref 3.5–5.0)
Alkaline Phosphatase: 173 U/L — ABNORMAL HIGH (ref 38–126)
Anion gap: 8 (ref 5–15)
BUN: 40 mg/dL — ABNORMAL HIGH (ref 8–23)
CO2: 21 mmol/L — ABNORMAL LOW (ref 22–32)
Calcium: 8.1 mg/dL — ABNORMAL LOW (ref 8.9–10.3)
Chloride: 105 mmol/L (ref 98–111)
Creatinine, Ser: 2.55 mg/dL — ABNORMAL HIGH (ref 0.44–1.00)
GFR calc Af Amer: 21 mL/min — ABNORMAL LOW (ref >60)
GFR calc non Af Amer: 18 mL/min — ABNORMAL LOW (ref >60)
Glucose, Bld: 117 mg/dL — ABNORMAL HIGH (ref 70–99)
Potassium: 3.8 mmol/L (ref 3.5–5.1)
Sodium: 134 mmol/L — ABNORMAL LOW (ref 135–145)
Total Bilirubin: 0.7 mg/dL (ref 0.3–1.2)
Total Protein: 4.9 g/dL — ABNORMAL LOW (ref 6.5–8.1)

## 2020-03-10 LAB — MAGNESIUM: Magnesium: 2.2 mg/dL (ref 1.7–2.4)

## 2020-03-10 MED ORDER — POTASSIUM & SODIUM PHOSPHATES 280-160-250 MG PO PACK
1.0000 | PACK | Freq: Three times a day (TID) | ORAL | Status: DC
  Administered 2020-03-10 – 2020-03-11 (×5): 1 via ORAL
  Filled 2020-03-10 (×5): qty 1

## 2020-03-10 NOTE — Progress Notes (Signed)
PROGRESS NOTE  Loretta Austin JQB:[id number] DOB: 07-May-1946 DOA: 03/08/2020 PCP: [name]   LOS: 2 days   Brief narrative: As per HPI,  Loretta Austin is a 74 y.o. female with medical history significant for hypertension, rheumatoid arthritis on Humira, microscopic colitis on maintenance budesonide, OSA, presented to emergency department with complains of  abdominal pain, chills, loss of appetite, and diarrhea.  Patient reported that she been in her usual state of health until 03/04/2020 when she developed generalized abdominal discomfort and watery diarrhea.  She also went on to experience nausea and has had a couple episodes of nonbloody vomiting.   Upon arrival to the ED, patient is found to be afebrile, saturating well on room air, and with stable blood pressure.  Chemistry panel notable for sodium 124, potassium 3.1, and creatinine 2.25 with no priors available for comparison.  CBC features a leukocytosis to 27,800.  Lactic acid was normal.  Urinalysis compatible with possible infection.  CT the abdomen and pelvis without acute findings.  Blood cultures were collected in the ED and the patient was given a liter of saline, 40 mEq potassium, morphine, and Rocephin.  Covid PCR screening was negative. Patient was then admitted to the hospital for further evaluation and treatment.  Assessment/Plan:  Principal Problem:   Hyponatremia Active Problems:   Obstructive sleep apnea   Microscopic colitis   Hypokalemia   Renal insufficiency   Combined abdominal pain, vomiting, and diarrhea   Hypertension   Rheumatoid arthritis ([place])   UTI (urinary tract infection) Gram-negative bacteremia  Abdominal pain with nausea, vomiting. Much improved.  History of microscopic colitis.. Had firm bowel movement today.  Lactic acid was within normal limits.  CT scan of the abdomen and pelvis without acute findings. Continue with IV antibiotic.  COVID-19 test was negative.  DC enteric  precautions.  GI pathogen panel showing EPEC.  Hypokalemia   improved with replacement.  Potassium of 3.8 today  Hypophosphatemia.  Phosphorus of 1.8.  Will put the patient on sodium-potassium phosphate orally. Check levels in a.m.  E. coli bacteremia, significant leukocytosis, Will continue to follow.  on IV Rocephin 2 g daily. Sensitivity pending.  Possible UTI  On IV rocephin, follow urine culture is still pending.  Renal insufficiency likely acute kidney injury on chronic kidney disease secondary to volume depletion.  - SCr is 2.25 on admission.  Unknown baseline.  Continue 2.5 today.  Received IV fluids with.  Likely acute injury with diarrhea nausea vomiting and use of losartan HCTZ.  No previous labs to compare.  CT scan without any hydronephrosis.   Medical records have been requested to compare labs   Hyponatremia  Likely hypovolemic from volume depletion and use of HCTZ..  Continue IV fluid hydration.  Sodium of 132 today from 124 on presentation.  Continue normal saline hydration  Essential Hypertension  Hold losartan- HCTZ.  Continue to monitor blood pressure.  History of microscopic colitis  - Managed with Budesonide 3 mg daily, continue steroids for now.  History of Rheumatoid arthritis  On Humira as outpatient.  OSA  Continue CPAP at night.  Anemia likely secondary to chronic disease.  Will closely monitor CBC.  DVT prophylaxis: heparin injection 5,000 Units Start: 03/08/20 2200  Code Status: Full code  Family Communication: None today. I spoke with the patient's daughter Ms. Benjie Karvonen on the phone yesterday  Status is: Inpatient  Remains inpatient appropriate because: IV treatments appropriate due to intensity of illness or inability to take PO and  Inpatient level of care appropriate due to severity of illness, gram-negative bacteremia, renal failure   Dispo: The patient is from: Home              Anticipated d/c is to: Home              Anticipated d/c  date is: 1 to 2 days              Patient currently is not medically stable to d/c.  Consultants:  None  Procedures:  None  Antibiotics:  . IV Rocephin 6/17>  Subjective: Today, patient was seen and examined at bedside. Patient feels little better today. Denies any nausea vomiting or abdominal pain. Has had a formed bowel movement.  Objective: Vitals:   03/09/20 2111 03/10/20 0503  BP: (!) 119/58 127/65  Pulse: 72 72  Resp: 16 15  Temp: 98.2 F (36.8 C) 98.6 F (37 C)  SpO2: 99% 100%    Intake/Output Summary (Last 24 hours) at 03/10/2020 0815 Last data filed at 03/10/2020 0500 Gross per 24 hour  Intake 1051.5 ml  Output 400 ml  Net 651.5 ml   Filed Weights   03/08/20 1216 03/08/20 2242  Weight: 73.5 kg 76.2 kg   Body mass index is 27.96 kg/m.   Physical Exam: GENERAL: Awake, awake and oriented.  Communicative.  Not in obvious distress HENT: No scleral pallor or icterus. Pupils equally reactive to light. Oral mucosa is dry NECK: is supple, no gross swelling noted. CHEST: Clear to auscultation. No crackles or wheezes.  Diminished breath sounds bilaterally. CVS: S1 and S2 heard, no murmur. Regular rate and rhythm.  ABDOMEN: Soft, non-tender, bowel sounds are present. EXTREMITIES: No edema. CNS: Cranial nerves are intact. No focal motor deficits. SKIN: warm and dry without rashes.  Data Review: I have personally reviewed the following laboratory data and studies,  CBC: Recent Labs  Lab 03/08/20 1230 03/09/20 0228 03/10/20 0457  WBC 27.8* 26.6* 32.4*  NEUTROABS  --  22.6*  --   HGB 12.9 10.6* 10.9*  HCT 37.7 31.3* 31.9*  MCV 90.4 91.5 90.9  PLT 187 148* 440   Basic Metabolic Panel: Recent Labs  Lab 03/08/20 1230 03/09/20 0228 03/10/20 0457  NA 124* 128* 134*  K 3.1* 2.9* 3.8  CL 87* 95* 105  CO2 23 22 21*  GLUCOSE 148* 117* 117*  BUN 32* 35* 40*  CREATININE 2.25* 2.75* 2.55*  CALCIUM 8.6* 8.0* 8.1*  MG  --  2.1 2.2  PHOS  --   --  1.8*    Liver Function Tests: Recent Labs  Lab 03/08/20 1230 03/09/20 0228 03/10/20 0457  AST 34 26 31  ALT 32 25 33  ALKPHOS 154* 130* 173*  BILITOT 1.4* 1.0 0.7  PROT 6.5 5.3* 4.9*  ALBUMIN 2.4* 1.9* 1.6*   Recent Labs  Lab 03/08/20 1230  LIPASE 12   No results for input(s): AMMONIA in the last 168 hours. Cardiac Enzymes: No results for input(s): CKTOTAL, CKMB, CKMBINDEX, TROPONINI in the last 168 hours. BNP (last 3 results) No results for input(s): BNP in the last 8760 hours.  ProBNP (last 3 results) No results for input(s): PROBNP in the last 8760 hours.  CBG: No results for input(s): GLUCAP in the last 168 hours. Recent Results (from the past 240 hour(s))  Blood culture (routine x 2)     Status: None (Preliminary result)   Collection Time: 03/08/20  4:24 PM   Specimen: BLOOD  Result Value Ref Range  Status   Specimen Description BLOOD RIGHT ANTECUBITAL  Final   Special Requests   Final    BOTTLES DRAWN AEROBIC AND ANAEROBIC Blood Culture results may not be optimal due to an excessive volume of blood received in culture bottles   Culture  Setup Time   Final    GRAM NEGATIVE RODS IN BOTH AEROBIC AND ANAEROBIC BOTTLES CRITICAL RESULT CALLED TO, READ BACK BY AND VERIFIED WITH: Roxy Manns  [id number] FCP Performed at [facility], [place] [address] [place], [place] [address]    Culture GRAM NEGATIVE RODS  Final   Report Status PENDING  Incomplete  Blood Culture ID Panel (Reflexed)     Status: Abnormal   Collection Time: 03/08/20  4:24 PM  Result Value Ref Range Status   Enterococcus species NOT DETECTED NOT DETECTED Final   Listeria monocytogenes NOT DETECTED NOT DETECTED Final   Staphylococcus species NOT DETECTED NOT DETECTED Final   Staphylococcus aureus (BCID) NOT DETECTED NOT DETECTED Final   Streptococcus species NOT DETECTED NOT DETECTED Final   Streptococcus agalactiae NOT DETECTED NOT DETECTED Final   Streptococcus pneumoniae NOT DETECTED NOT  DETECTED Final   Streptococcus pyogenes NOT DETECTED NOT DETECTED Final   Acinetobacter baumannii NOT DETECTED NOT DETECTED Final   Enterobacteriaceae species DETECTED (A) NOT DETECTED Final    Comment: Enterobacteriaceae represent a large family of gram-negative bacteria, not a single organism. CRITICAL RESULT CALLED TO, READ BACK BY AND VERIFIED WITH: PHARMD D HALEY D  [id number] FCP    Enterobacter cloacae complex NOT DETECTED NOT DETECTED Final   Escherichia coli DETECTED (A) NOT DETECTED Final    Comment: CRITICAL RESULT CALLED TO, READ BACK BY AND VERIFIED WITH: PHARMD D HALEY D  [id number] FCP    Klebsiella oxytoca NOT DETECTED NOT DETECTED Final   Klebsiella pneumoniae NOT DETECTED NOT DETECTED Final   Proteus species NOT DETECTED NOT DETECTED Final   Serratia marcescens NOT DETECTED NOT DETECTED Final   Carbapenem resistance NOT DETECTED NOT DETECTED Final   Haemophilus influenzae NOT DETECTED NOT DETECTED Final   Neisseria meningitidis NOT DETECTED NOT DETECTED Final   Pseudomonas aeruginosa NOT DETECTED NOT DETECTED Final   Candida albicans NOT DETECTED NOT DETECTED Final   Candida glabrata NOT DETECTED NOT DETECTED Final   Candida krusei NOT DETECTED NOT DETECTED Final   Candida parapsilosis NOT DETECTED NOT DETECTED Final   Candida tropicalis NOT DETECTED NOT DETECTED Final    Comment: Performed at [facility], [place] [address] [place], [place] [address]  Blood culture (routine x 2)     Status: None (Preliminary result)   Collection Time: 03/08/20  4:34 PM   Specimen: BLOOD RIGHT WRIST  Result Value Ref Range Status   Specimen Description BLOOD RIGHT WRIST  Final   Special Requests   Final    BOTTLES DRAWN AEROBIC ONLY Blood Culture adequate volume   Culture  Setup Time   Final    AEROBIC BOTTLE ONLY GRAM NEGATIVE RODS CRITICAL VALUE NOTED.  VALUE IS CONSISTENT WITH PREVIOUSLY REPORTED AND CALLED VALUE.    Culture   Final    NO GROWTH < 24 HOURS Performed  at [facility], [place] [address], [place], [place] [address]    Report Status PENDING  Incomplete  SARS Coronavirus 2 by RT PCR (hospital order, performed in [facility] hospital lab) Nasopharyngeal Nasopharyngeal Swab     Status: None   Collection Time: 03/08/20  7:53  PM   Specimen: Nasopharyngeal Swab  Result Value Ref Range Status   SARS Coronavirus 2 NEGATIVE NEGATIVE Final    Comment: (NOTE) SARS-CoV-2 target nucleic acids are NOT DETECTED.  The SARS-CoV-2 RNA is generally detectable in upper and lower respiratory specimens during the acute phase of infection. The lowest concentration of SARS-CoV-2 viral copies this assay can detect is 250 copies / mL. A negative result does not preclude SARS-CoV-2 infection and should not be used as the sole basis for treatment or other patient management decisions.  A negative result may occur with improper specimen collection / handling, submission of specimen other than nasopharyngeal swab, presence of viral mutation(s) within the areas targeted by this assay, and inadequate number of viral copies (<250 copies / mL). A negative result must be combined with clinical observations, patient history, and epidemiological information.  Fact Sheet for Patients:   StrictlyIdeas.no  Fact Sheet for Healthcare Providers: BankingDealers.co.za  This test is not yet approved or  cleared by the [place] FDA and has been authorized for detection and/or diagnosis of SARS-CoV-2 by FDA under an Emergency Use Authorization (EUA).  This EUA will remain in effect (meaning this test can be used) for the duration of the COVID-19 declaration under Section 564(b)(1) of the Act, 21 U.S.C. section 360bbb-3(b)(1), unless the authorization is terminated or revoked sooner.  Performed at [facility], [place] [address], [place], [place] [address]      Studies: CT ABDOMEN PELVIS WO CONTRAST  Result Date:  03/08/2020 CLINICAL DATA:  Nausea, vomiting, diarrhea, lower abdominal pain EXAM: CT ABDOMEN AND PELVIS WITHOUT CONTRAST TECHNIQUE: Multidetector CT imaging of the abdomen and pelvis was performed following the standard protocol without IV contrast. COMPARISON:  None. FINDINGS: Lower chest: No acute pleural or parenchymal lung disease. Hepatobiliary: Large calcified gallstone without cholecystitis. Unenhanced imaging of the liver is unremarkable. Pancreas: Unremarkable. No pancreatic ductal dilatation or surrounding inflammatory changes. Spleen: Normal in size without focal abnormality. Adrenals/Urinary Tract: No urinary tract calculi or obstructive uropathy. The adrenals are unremarkable. The bladder is normal. Stomach/Bowel: No bowel obstruction or ileus. Distal colonic diverticulosis without diverticulitis. Normal appendix right lower quadrant. Vascular/Lymphatic: Aortic atherosclerosis. No enlarged abdominal or pelvic lymph nodes. Reproductive: Uterus and bilateral adnexa are unremarkable. Other: No abdominal wall hernia or abnormality. No abdominopelvic ascites. Musculoskeletal: No acute or destructive bony lesions. Reconstructed images demonstrate no additional findings. IMPRESSION: 1. Cholelithiasis without cholecystitis. 2. Distal colonic diverticulosis without diverticulitis. 3. Aortic Atherosclerosis (ICD10-I70.0). Electronically Signed   By: Randa Ngo M.D.   On: 03/08/2020 19:02      Flora Lipps, MD  [facility] 03/10/2020

## 2020-03-10 NOTE — Progress Notes (Signed)
Pt refusing CPAP again for tonight. States she is doing fine with [place] overnight. Advised pt to notify for RT if she changes her mind.

## 2020-03-11 LAB — URINE CULTURE: Culture: NO GROWTH

## 2020-03-11 LAB — CBC
HCT: 34 % — ABNORMAL LOW (ref 36.0–46.0)
Hemoglobin: 11.2 g/dL — ABNORMAL LOW (ref 12.0–15.0)
MCH: 30.9 pg (ref 26.0–34.0)
MCHC: 32.9 g/dL (ref 30.0–36.0)
MCV: 93.9 fL (ref 80.0–100.0)
Platelets: 233 10*3/uL (ref 150–400)
RBC: 3.62 MIL/uL — ABNORMAL LOW (ref 3.87–5.11)
RDW: 14 % (ref 11.5–15.5)
WBC: 31.3 10*3/uL — ABNORMAL HIGH (ref 4.0–10.5)
nRBC: 0 % (ref 0.0–0.2)

## 2020-03-11 LAB — BASIC METABOLIC PANEL
Anion gap: 10 (ref 5–15)
BUN: 42 mg/dL — ABNORMAL HIGH (ref 8–23)
CO2: 22 mmol/L (ref 22–32)
Calcium: 8.2 mg/dL — ABNORMAL LOW (ref 8.9–10.3)
Chloride: 103 mmol/L (ref 98–111)
Creatinine, Ser: 2.41 mg/dL — ABNORMAL HIGH (ref 0.44–1.00)
GFR calc Af Amer: 22 mL/min — ABNORMAL LOW (ref >60)
GFR calc non Af Amer: 19 mL/min — ABNORMAL LOW (ref >60)
Glucose, Bld: 66 mg/dL — ABNORMAL LOW (ref 70–99)
Potassium: 3.6 mmol/L (ref 3.5–5.1)
Sodium: 135 mmol/L (ref 135–145)

## 2020-03-11 LAB — CULTURE, BLOOD (ROUTINE X 2): Special Requests: ADEQUATE

## 2020-03-11 LAB — PHOSPHORUS: Phosphorus: 2.5 mg/dL (ref 2.5–4.6)

## 2020-03-11 LAB — MAGNESIUM: Magnesium: 2.2 mg/dL (ref 1.7–2.4)

## 2020-03-11 MED ORDER — CEFAZOLIN SODIUM-DEXTROSE 2-4 GM/100ML-% IV SOLN
2.0000 g | Freq: Two times a day (BID) | INTRAVENOUS | Status: DC
  Administered 2020-03-11: 2 g via INTRAVENOUS
  Filled 2020-03-11 (×2): qty 100

## 2020-03-11 NOTE — Progress Notes (Signed)
PROGRESS NOTE  Inge Waldroup AOZ:[id number] DOB: 1946/04/18 DOA: 03/08/2020 PCP: [name]   LOS: 3 days   Brief narrative: As per HPI,  Loretta Austin is a 74 y.o. female with medical history significant for hypertension, rheumatoid arthritis on Humira, microscopic colitis on maintenance budesonide, OSA, presented to emergency department with complains of  abdominal pain, chills, loss of appetite, and diarrhea.  Patient reported that she been in her usual state of health until 03/04/2020 when she developed generalized abdominal discomfort and watery diarrhea.  She also went on to experience nausea and has had a couple episodes of nonbloody vomiting.   Upon arrival to the ED, patient is found to be afebrile, saturating well on room air, and with stable blood pressure.  Chemistry panel notable for sodium 124, potassium 3.1, and creatinine 2.25 with no priors available for comparison.  CBC features a leukocytosis to 27,800.  Lactic acid was normal.  Urinalysis compatible with possible infection.  CT the abdomen and pelvis without acute findings.  Blood cultures were collected in the ED and the patient was given a liter of saline, 40 mEq potassium, morphine, and Rocephin.  Covid PCR screening was negative. Patient was then admitted to the hospital for further evaluation and treatment.  Assessment/Plan:  Principal Problem:   Hyponatremia Active Problems:   Obstructive sleep apnea   Microscopic colitis   Hypokalemia   Renal insufficiency   Combined abdominal pain, vomiting, and diarrhea   Hypertension   Rheumatoid arthritis ([place])   UTI (urinary tract infection) E. coli bacteremia  Abdominal pain with nausea, vomiting.  improved.  History of microscopic colitis. Lactic acid was within normal limits.  CT scan of the abdomen and pelvis without acute findings.  COVID-19 test was negative.    GI pathogen panel showing EPEC.  Hypokalemia   improved with replacement.   Potassium of 3.6 today  Hypophosphatemia.  Phosphorus of 2.5.   on sodium-potassium phosphate orally.  Will discontinue supplement.  E. coli bacteremia, significant leukocytosis, Will continue to follow.  on IV Rocephin 2 g daily.  Sensitive to multiple antibiotics.  Will de-escalate to cefazolin today.  Spoke with pharmacy.  Will consider ampicillin 500 mg q12h or Keflex 500 mg q12h on discharge, consider total of 10-day course on discharge  Renal insufficiency likely on chronic kidney disease, unable to stage at this time.  - SCr is 2.25 on admission.  Unknown baseline.  Creatinine of 2.4 today.  Continue to hold  Losartan- HCTZ.  No previous labs to compare.  CT scan without any hydronephrosis.   Medical records have been requested to compare labs   Hyponatremia  Likely hypovolemic from volume depletion and use of HCTZ.. Improved at this time.  Patient received IV fluid hydration.  Sodium of 135 today.    Essential Hypertension  Hold losartan- HCTZ.  Continue to monitor blood pressure.  We will add amlodipine and as needed hydralazine.  History of microscopic colitis  - Managed with Budesonide 3 mg daily, continue steroids for now.  History of Rheumatoid arthritis  On Humira as outpatient.  Hold off until infection is treated.  OSA  Continue CPAP at night.  Anemia likely secondary to chronic disease.  Will closely monitor CBC.  Latest hemoglobin of 11.2.  DVT prophylaxis: heparin injection 5,000 Units Start: 03/08/20 2200  Code Status: Full code  Family Communication:  I spoke with the patient's daughter Ms. Blair  at bedside today.  Status is: Inpatient  Remains inpatient appropriate because:  IV treatments appropriate due to intensity of illness or inability to take PO and Inpatient level of care appropriate due to severity of illness, gram-negative bacteremia, renal failure, significant leukocytosis  Dispo: The patient is from: Home              Anticipated d/c is to:  Home              Anticipated d/c date is: 1 to 2 days if trending down WBC with p.o. antibiotics              Patient currently is not medically stable to d/c.  Consultants:  None  Procedures:  None  Antibiotics:  . IV Rocephin 6/17>  Subjective: Today, patient feels okay overall.  Has had a bowel movement yesterday.  No diarrhea.  No abdominal pain but has lower back pain.  Feels thirsty.  Denies any fever chills or rigor.  Objective: Vitals:   03/10/20 1955 03/11/20 0450  BP: (!) 152/64 (!) 165/74  Pulse: 67 72  Resp: 16 18  Temp: 98.3 F (36.8 C) 98.1 F (36.7 C)  SpO2: 100% 100%   No intake or output data in the 24 hours ending 03/11/20 1151 Filed Weights   03/08/20 1216 03/08/20 2242  Weight: 73.5 kg 76.2 kg   Body mass index is 27.96 kg/m.   Physical Exam: GENERAL: Awake, awake and oriented.  Communicative.  Not in obvious distress HENT: No scleral pallor or icterus. Pupils equally reactive to light. Oral mucosa is moist NECK: is supple, no gross swelling noted. CHEST: Clear to auscultation. No crackles or wheezes.  Diminished breath sounds bilaterally. CVS: S1 and S2 heard, no murmur. Regular rate and rhythm.  ABDOMEN: Soft, non-tender, bowel sounds are present. EXTREMITIES: No edema. CNS: Cranial nerves are intact. No focal motor deficits. SKIN: warm and dry without rashes.  Data Review: I have personally reviewed the following laboratory data and studies,  CBC: Recent Labs  Lab 03/08/20 1230 03/09/20 0228 03/10/20 0457 03/11/20 0429  WBC 27.8* 26.6* 32.4* 31.3*  NEUTROABS  --  22.6*  --   --   HGB 12.9 10.6* 10.9* 11.2*  HCT 37.7 31.3* 31.9* 34.0*  MCV 90.4 91.5 90.9 93.9  PLT 187 148* 185 254   Basic Metabolic Panel: Recent Labs  Lab 03/08/20 1230 03/09/20 0228 03/10/20 0457 03/11/20 0429  NA 124* 128* 134* 135  K 3.1* 2.9* 3.8 3.6  CL 87* 95* 105 103  CO2 23 22 21* 22  GLUCOSE 148* 117* 117* 66*  BUN 32* 35* 40* 42*  CREATININE  2.25* 2.75* 2.55* 2.41*  CALCIUM 8.6* 8.0* 8.1* 8.2*  MG  --  2.1 2.2 2.2  PHOS  --   --  1.8* 2.5   Liver Function Tests: Recent Labs  Lab 03/08/20 1230 03/09/20 0228 03/10/20 0457  AST 34 26 31  ALT 32 25 33  ALKPHOS 154* 130* 173*  BILITOT 1.4* 1.0 0.7  PROT 6.5 5.3* 4.9*  ALBUMIN 2.4* 1.9* 1.6*   Recent Labs  Lab 03/08/20 1230  LIPASE 12   No results for input(s): AMMONIA in the last 168 hours. Cardiac Enzymes: No results for input(s): CKTOTAL, CKMB, CKMBINDEX, TROPONINI in the last 168 hours. BNP (last 3 results) No results for input(s): BNP in the last 8760 hours.  ProBNP (last 3 results) No results for input(s): PROBNP in the last 8760 hours.  CBG: No results for input(s): GLUCAP in the last 168 hours. Recent Results (from the past 240  hour(s))  Blood culture (routine x 2)     Status: Abnormal   Collection Time: 03/08/20  4:24 PM   Specimen: BLOOD  Result Value Ref Range Status   Specimen Description BLOOD RIGHT ANTECUBITAL  Final   Special Requests   Final    BOTTLES DRAWN AEROBIC AND ANAEROBIC Blood Culture results may not be optimal due to an excessive volume of blood received in culture bottles   Culture  Setup Time   Final    GRAM NEGATIVE RODS IN BOTH AEROBIC AND ANAEROBIC BOTTLES CRITICAL RESULT CALLED TO, READ BACK BY AND VERIFIED WITH: Roxy Manns  [id number] FCP Performed at [facility], [place] [address], [place], [place] [address]    Culture ESCHERICHIA COLI (A)  Final   Report Status 03/11/2020 FINAL  Final   Organism ID, Bacteria ESCHERICHIA COLI  Final      Susceptibility   Escherichia coli - MIC*    AMPICILLIN <=2 SENSITIVE Sensitive     CEFAZOLIN <=4 SENSITIVE Sensitive     CEFEPIME <=0.12 SENSITIVE Sensitive     CEFTAZIDIME <=1 SENSITIVE Sensitive     CEFTRIAXONE <=0.25 SENSITIVE Sensitive     CIPROFLOXACIN <=0.25 SENSITIVE Sensitive     GENTAMICIN <=1 SENSITIVE Sensitive     IMIPENEM <=0.25 SENSITIVE Sensitive      TRIMETH/SULFA <=20 SENSITIVE Sensitive     AMPICILLIN/SULBACTAM <=2 SENSITIVE Sensitive     PIP/TAZO <=4 SENSITIVE Sensitive     * ESCHERICHIA COLI  Blood Culture ID Panel (Reflexed)     Status: Abnormal   Collection Time: 03/08/20  4:24 PM  Result Value Ref Range Status   Enterococcus species NOT DETECTED NOT DETECTED Final   Listeria monocytogenes NOT DETECTED NOT DETECTED Final   Staphylococcus species NOT DETECTED NOT DETECTED Final   Staphylococcus aureus (BCID) NOT DETECTED NOT DETECTED Final   Streptococcus species NOT DETECTED NOT DETECTED Final   Streptococcus agalactiae NOT DETECTED NOT DETECTED Final   Streptococcus pneumoniae NOT DETECTED NOT DETECTED Final   Streptococcus pyogenes NOT DETECTED NOT DETECTED Final   Acinetobacter baumannii NOT DETECTED NOT DETECTED Final   Enterobacteriaceae species DETECTED (A) NOT DETECTED Final    Comment: Enterobacteriaceae represent a large family of gram-negative bacteria, not a single organism. CRITICAL RESULT CALLED TO, READ BACK BY AND VERIFIED WITH: PHARMD D HALEY D  [id number] FCP    Enterobacter cloacae complex NOT DETECTED NOT DETECTED Final   Escherichia coli DETECTED (A) NOT DETECTED Final    Comment: CRITICAL RESULT CALLED TO, READ BACK BY AND VERIFIED WITH: PHARMD D HALEY D  [id number] FCP    Klebsiella oxytoca NOT DETECTED NOT DETECTED Final   Klebsiella pneumoniae NOT DETECTED NOT DETECTED Final   Proteus species NOT DETECTED NOT DETECTED Final   Serratia marcescens NOT DETECTED NOT DETECTED Final   Carbapenem resistance NOT DETECTED NOT DETECTED Final   Haemophilus influenzae NOT DETECTED NOT DETECTED Final   Neisseria meningitidis NOT DETECTED NOT DETECTED Final   Pseudomonas aeruginosa NOT DETECTED NOT DETECTED Final   Candida albicans NOT DETECTED NOT DETECTED Final   Candida glabrata NOT DETECTED NOT DETECTED Final   Candida krusei NOT DETECTED NOT DETECTED Final   Candida parapsilosis NOT DETECTED NOT  DETECTED Final   Candida tropicalis NOT DETECTED NOT DETECTED Final    Comment: Performed at [facility], [place] [address] [place], [place] [address]  Blood culture (routine x 2)     Status: Abnormal  Collection Time: 03/08/20  4:34 PM   Specimen: BLOOD RIGHT WRIST  Result Value Ref Range Status   Specimen Description BLOOD RIGHT WRIST  Final   Special Requests   Final    BOTTLES DRAWN AEROBIC ONLY Blood Culture adequate volume   Culture  Setup Time   Final    AEROBIC BOTTLE ONLY GRAM NEGATIVE RODS CRITICAL VALUE NOTED.  VALUE IS CONSISTENT WITH PREVIOUSLY REPORTED AND CALLED VALUE.    Culture (A)  Final    ESCHERICHIA COLI SUSCEPTIBILITIES PERFORMED ON PREVIOUS CULTURE WITHIN THE LAST 5 DAYS. Performed at [facility], [place] [address] [place], [place] [address]    Report Status 03/11/2020 FINAL  Final  SARS Coronavirus 2 by RT PCR (hospital order, performed in [facility] hospital lab) Nasopharyngeal Nasopharyngeal Swab     Status: None   Collection Time: 03/08/20  7:53 PM   Specimen: Nasopharyngeal Swab  Result Value Ref Range Status   SARS Coronavirus 2 NEGATIVE NEGATIVE Final    Comment: (NOTE) SARS-CoV-2 target nucleic acids are NOT DETECTED.  The SARS-CoV-2 RNA is generally detectable in upper and lower respiratory specimens during the acute phase of infection. The lowest concentration of SARS-CoV-2 viral copies this assay can detect is 250 copies / mL. A negative result does not preclude SARS-CoV-2 infection and should not be used as the sole basis for treatment or other patient management decisions.  A negative result may occur with improper specimen collection / handling, submission of specimen other than nasopharyngeal swab, presence of viral mutation(s) within the areas targeted by this assay, and inadequate number of viral copies (<250 copies / mL). A negative result must be combined with clinical observations, patient history, and epidemiological  information.  Fact Sheet for Patients:   StrictlyIdeas.no  Fact Sheet for Healthcare Providers: BankingDealers.co.za  This test is not yet approved or  cleared by the [place] FDA and has been authorized for detection and/or diagnosis of SARS-CoV-2 by FDA under an Emergency Use Authorization (EUA).  This EUA will remain in effect (meaning this test can be used) for the duration of the COVID-19 declaration under Section 564(b)(1) of the Act, 21 U.S.C. section 360bbb-3(b)(1), unless the authorization is terminated or revoked sooner.  Performed at [facility], [place] [address], [place], [place] [address]   Gastrointestinal Panel by PCR , Stool     Status: Abnormal   Collection Time: 03/10/20  9:13 AM   Specimen: Stool  Result Value Ref Range Status   Campylobacter species NOT DETECTED NOT DETECTED Final   Plesimonas shigelloides NOT DETECTED NOT DETECTED Final   Salmonella species NOT DETECTED NOT DETECTED Final   Yersinia enterocolitica NOT DETECTED NOT DETECTED Final   Vibrio species NOT DETECTED NOT DETECTED Final   Vibrio cholerae NOT DETECTED NOT DETECTED Final   Enteroaggregative E coli (EAEC) NOT DETECTED NOT DETECTED Final   Enteropathogenic E coli (EPEC) DETECTED (A) NOT DETECTED Final    Comment: RESULT CALLED TO, READ BACK BY AND VERIFIED WITH: LISA THOMPSON AT 1342 03/10/20 SDR    Enterotoxigenic E coli (ETEC) NOT DETECTED NOT DETECTED Final   Shiga like toxin producing E coli (STEC) NOT DETECTED NOT DETECTED Final   Shigella/Enteroinvasive E coli (EIEC) NOT DETECTED NOT DETECTED Final   Cryptosporidium NOT DETECTED NOT DETECTED Final   Cyclospora cayetanensis NOT DETECTED NOT DETECTED Final   Entamoeba histolytica NOT DETECTED NOT DETECTED Final   Giardia lamblia NOT DETECTED NOT DETECTED Final   Adenovirus F40/41  NOT DETECTED NOT DETECTED Final   Astrovirus NOT DETECTED NOT DETECTED Final   Norovirus GI/GII  NOT DETECTED NOT DETECTED Final   Rotavirus A NOT DETECTED NOT DETECTED Final   Sapovirus (I, II, IV, and V) NOT DETECTED NOT DETECTED Final    Comment: Performed at [facility], [place] [place], [place] [address]  Culture, Urine     Status: None   Collection Time: 03/10/20  9:32 AM   Specimen: Urine, Random  Result Value Ref Range Status   Specimen Description URINE, RANDOM  Final   Special Requests NONE  Final   Culture   Final    NO GROWTH Performed at [facility], [place] [address], [place], [place] [address]    Report Status 03/11/2020 FINAL  Final     Studies: No results found.    Flora Lipps, MD  [facility] 03/11/2020

## 2020-03-12 LAB — COMPREHENSIVE METABOLIC PANEL
ALT: 47 U/L — ABNORMAL HIGH (ref 0–44)
AST: 34 U/L (ref 15–41)
Albumin: 1.8 g/dL — ABNORMAL LOW (ref 3.5–5.0)
Alkaline Phosphatase: 206 U/L — ABNORMAL HIGH (ref 38–126)
Anion gap: 9 (ref 5–15)
BUN: 40 mg/dL — ABNORMAL HIGH (ref 8–23)
CO2: 22 mmol/L (ref 22–32)
Calcium: 8 mg/dL — ABNORMAL LOW (ref 8.9–10.3)
Chloride: 104 mmol/L (ref 98–111)
Creatinine, Ser: 2.23 mg/dL — ABNORMAL HIGH (ref 0.44–1.00)
GFR calc Af Amer: 25 mL/min — ABNORMAL LOW (ref >60)
GFR calc non Af Amer: 21 mL/min — ABNORMAL LOW (ref >60)
Glucose, Bld: 105 mg/dL — ABNORMAL HIGH (ref 70–99)
Potassium: 3.5 mmol/L (ref 3.5–5.1)
Sodium: 135 mmol/L (ref 135–145)
Total Bilirubin: 0.5 mg/dL (ref 0.3–1.2)
Total Protein: 5.1 g/dL — ABNORMAL LOW (ref 6.5–8.1)

## 2020-03-12 LAB — CBC
HCT: 31.8 % — ABNORMAL LOW (ref 36.0–46.0)
Hemoglobin: 10.7 g/dL — ABNORMAL LOW (ref 12.0–15.0)
MCH: 30.7 pg (ref 26.0–34.0)
MCHC: 33.6 g/dL (ref 30.0–36.0)
MCV: 91.4 fL (ref 80.0–100.0)
Platelets: 251 10*3/uL (ref 150–400)
RBC: 3.48 MIL/uL — ABNORMAL LOW (ref 3.87–5.11)
RDW: 13.7 % (ref 11.5–15.5)
WBC: 27.6 10*3/uL — ABNORMAL HIGH (ref 4.0–10.5)
nRBC: 0 % (ref 0.0–0.2)

## 2020-03-12 LAB — MAGNESIUM: Magnesium: 2.1 mg/dL (ref 1.7–2.4)

## 2020-03-12 LAB — PHOSPHORUS: Phosphorus: 3.9 mg/dL (ref 2.5–4.6)

## 2020-03-12 MED ORDER — CEPHALEXIN 500 MG PO CAPS: 500.0000 mg | ORAL_CAPSULE | Freq: Two times a day (BID) | ORAL | 0 refills | Status: AC

## 2020-03-12 MED ORDER — LOSARTAN POTASSIUM 50 MG PO TABS
50.0000 mg | ORAL_TABLET | Freq: Every day | ORAL | 2 refills | Status: DC
Start: 2020-03-12 — End: 2020-07-02

## 2020-03-12 NOTE — Progress Notes (Signed)
Discharged patient to home, AVS given and explained with daughter at bedside. Concerns addressed and questions answered to patient's satisfaction. Belongings returned accordingly.

## 2020-03-12 NOTE — Discharge Summary (Signed)
Physician Discharge Summary  Ileanna Austin DHR:[id number] DOB: 1946/07/20 DOA: 03/08/2020  PCP: [name]  Admit date: 03/08/2020 Discharge date: 03/12/2020  Admitted From: Home  Discharge disposition: Home   Recommendations for Outpatient Follow-Up:    Follow up with your primary care provider in one week.   Check CBC, BMP, magnesium in the next visit.  Blood pressure medication has been changed to losartan only from losartan hydrochlorothiazide.  Possible that patient might need additional antihypertensives  It appears that patient likely has chronic kidney disease.  Please arrange for follow-up and possible consultation with nephrology.  No previous labs were available in our system to compare.    Advised to hold Humira until completion of  antibiotic.   Discharge Diagnosis:   Principal Problem:   Hyponatremia Active Problems:   Obstructive sleep apnea   Microscopic colitis   Hypokalemia   Renal insufficiency   Combined abdominal pain, vomiting, and diarrhea   Hypertension   Rheumatoid arthritis ([place])   UTI (urinary tract infection)   Discharge Condition: Improved.  Diet recommendation: Low sodium, heart healthy.   Wound care: None.   Code status: Full.   History of Present Illness:   [name] a 74 y.o.femalewith medical history significant forhypertension, rheumatoid arthritis on Humira, microscopic colitis on maintenance budesonide, OSA, presented to emergency department with complains of  abdominal pain, chills, loss of appetite, and diarrhea. Patient reported that she had been in her usual state of health until 03/04/2020 when she developed generalized abdominal discomfort, watery diarrhea, nausea and few episodes of vomiting.Upon arrival to the ED, patient was found to be afebrile, saturating well on room air, and with stable blood pressure. Chemistry panel was notable for sodium 124, potassium 3.1, and creatinine 2.25  with no priors available for comparison. CBC featured a leukocytosis to 27,800. Lactic acid was normal. Urinalysis compatible with possible infection. CT the abdomen and pelvis without acute findings. Blood cultures were collected in the ED and the patient was given a liter of saline, 40 mEq potassium, morphine, and Rocephin. Covid PCR screening was negative. Patient was then admitted to the hospital for further evaluation and treatment.   Hospital Course:   Following conditions were addressed during hospitalization as listed below,  Abdominal pain with nausea, vomiting. Resolved.  History of microscopic colitis. Lactic acid was within normal limits.  CT scan of the abdomen and pelvis without acute findings.  COVID-19 test was negative.    GI pathogen panel showing EPEC.  Symptomatically improved with IV fluids antiemetics.  Hypokalemia   improved with replacement.  Potassium of 3.5 prior to discharge  Hypophosphatemia.  Improved with replacement.  Phosphorus prior to discharge was 3.9  E. coli bacteremia, significant leukocytosis,  received IV Rocephin 2 g daily.  Sensitive to multiple antibiotics.    Patient will be continued on Keflex 500 mg q12h on discharge to complete the course.  Hold Humira until completion of antibiotic  Renal insufficiency likely on chronic kidney disease, unable to stage at this time.  -SCr is 2.25 on admission.  Unknown baseline.  Creatinine of 2.2 today despite holding off on medications and IV fluids..  Discontinue hydrochlorothiazide and continue losartan on discharge.  No previous labs to compare.  CT scan without any hydronephrosis.   Medical records have been requested to compare labs but were pending prior to discharge.  Primary care to follow-up renal function test on this patient and possible nephrology follow-up in the future   Hyponatremia Likely hypovolemic  from volume depletion and use of HCTZ.. Improved at this time.  Patient received IV  fluid hydration.  Sodium of 135 today.    EssentialHypertension We will discontinue losartan- HCTZ changed to losartan on discharge.  Might need additional antihypertensives as outpatient.  Blood pressure prior to discharge was 143/81  History of microscopic colitis -Managed with Budesonide 3 mg daily, continue steroids for now.  History ofRheumatoid arthritis On Humira as outpatient.  Hold off until antibiotics are completed  OSA  Continue CPAP at night.  Anemia likely secondary to chronic disease. Latest hemoglobin of 11.2.  Monitor as outpatient  Disposition.  At this time, patient is stable for disposition home.  Patient will have to follow-up with your primary care physician as outpatient.  Medical Consultants:    None  Procedures:    None Subjective:   Today, patient feels better.  Denies any fever, chills or rigor.  Feels more energetic.  No nausea vomiting, abdominal pain or diarrhea.  Discharge Exam:   Vitals:   03/11/20 2054 03/12/20 0443  BP: (!) 142/83 (!) 143/81  Pulse: 68 70  Resp: 18 18  Temp: 98.6 F (37 C) 98.4 F (36.9 C)  SpO2: 100% 100%   Vitals:   03/10/20 1955 03/11/20 0450 03/11/20 2054 03/12/20 0443  BP: (!) 152/64 (!) 165/74 (!) 142/83 (!) 143/81  Pulse: 67 72 68 70  Resp: 16 18 18 18   Temp: 98.3 F (36.8 C) 98.1 F (36.7 C) 98.6 F (37 C) 98.4 F (36.9 C)  TempSrc: Oral Oral Oral Oral  SpO2: 100% 100% 100% 100%  Weight:      Height:       General: Alert awake, not in obvious distress HENT: pupils equally reacting to light,  No scleral pallor or icterus noted. Oral mucosa is moist.  Chest:  Clear breath sounds.  Diminished breath sounds bilaterally. No crackles or wheezes.  CVS: S1 &S2 heard. No murmur.  Regular rate and rhythm. Abdomen: Soft, nontender, nondistended.  Bowel sounds are heard.   Extremities: No cyanosis, clubbing or edema.  Peripheral pulses are palpable. Psych: Alert, awake and oriented, normal  mood CNS:  No cranial nerve deficits.  Power equal in all extremities.   Skin: Warm and dry.  No rashes noted.  The results of significant diagnostics from this hospitalization (including imaging, microbiology, ancillary and laboratory) are listed below for reference.     Diagnostic Studies:   CT ABDOMEN PELVIS WO CONTRAST  Result Date: 03/08/2020 CLINICAL DATA:  Nausea, vomiting, diarrhea, lower abdominal pain EXAM: CT ABDOMEN AND PELVIS WITHOUT CONTRAST TECHNIQUE: Multidetector CT imaging of the abdomen and pelvis was performed following the standard protocol without IV contrast. COMPARISON:  None. FINDINGS: Lower chest: No acute pleural or parenchymal lung disease. Hepatobiliary: Large calcified gallstone without cholecystitis. Unenhanced imaging of the liver is unremarkable. Pancreas: Unremarkable. No pancreatic ductal dilatation or surrounding inflammatory changes. Spleen: Normal in size without focal abnormality. Adrenals/Urinary Tract: No urinary tract calculi or obstructive uropathy. The adrenals are unremarkable. The bladder is normal. Stomach/Bowel: No bowel obstruction or ileus. Distal colonic diverticulosis without diverticulitis. Normal appendix right lower quadrant. Vascular/Lymphatic: Aortic atherosclerosis. No enlarged abdominal or pelvic lymph nodes. Reproductive: Uterus and bilateral adnexa are unremarkable. Other: No abdominal wall hernia or abnormality. No abdominopelvic ascites. Musculoskeletal: No acute or destructive bony lesions. Reconstructed images demonstrate no additional findings. IMPRESSION: 1. Cholelithiasis without cholecystitis. 2. Distal colonic diverticulosis without diverticulitis. 3. Aortic Atherosclerosis (ICD10-I70.0). Electronically Signed   By: Randa Ngo  M.D.   On: 03/08/2020 19:02     Labs:   Basic Metabolic Panel: Recent Labs  Lab 03/08/20 1230 03/08/20 1230 03/09/20 0228 03/09/20 0228 03/10/20 0457 03/10/20 0457 03/11/20 0429 03/12/20 0232   NA 124*  --  128*  --  134*  --  135 135  K 3.1*   < > 2.9*   < > 3.8   < > 3.6 3.5  CL 87*  --  95*  --  105  --  103 104  CO2 23  --  22  --  21*  --  22 22  GLUCOSE 148*  --  117*  --  117*  --  66* 105*  BUN 32*  --  35*  --  40*  --  42* 40*  CREATININE 2.25*  --  2.75*  --  2.55*  --  2.41* 2.23*  CALCIUM 8.6*  --  8.0*  --  8.1*  --  8.2* 8.0*  MG  --   --  2.1  --  2.2  --  2.2 2.1  PHOS  --   --   --   --  1.8*  --  2.5 3.9   < > = values in this interval not displayed.   GFR Estimated Creatinine Clearance: 22.9 mL/min (A) (by C-G formula based on SCr of 2.23 mg/dL (H)). Liver Function Tests: Recent Labs  Lab 03/08/20 1230 03/09/20 0228 03/10/20 0457 03/12/20 0232  AST 34 26 31 34  ALT 32 25 33 47*  ALKPHOS 154* 130* 173* 206*  BILITOT 1.4* 1.0 0.7 0.5  PROT 6.5 5.3* 4.9* 5.1*  ALBUMIN 2.4* 1.9* 1.6* 1.8*   Recent Labs  Lab 03/08/20 1230  LIPASE 12   No results for input(s): AMMONIA in the last 168 hours. Coagulation profile No results for input(s): INR, PROTIME in the last 168 hours.  CBC: Recent Labs  Lab 03/08/20 1230 03/09/20 0228 03/10/20 0457 03/11/20 0429 03/12/20 0232  WBC 27.8* 26.6* 32.4* 31.3* 27.6*  NEUTROABS  --  22.6*  --   --   --   HGB 12.9 10.6* 10.9* 11.2* 10.7*  HCT 37.7 31.3* 31.9* 34.0* 31.8*  MCV 90.4 91.5 90.9 93.9 91.4  PLT 187 148* 185 233 251   Cardiac Enzymes: No results for input(s): CKTOTAL, CKMB, CKMBINDEX, TROPONINI in the last 168 hours. BNP: Invalid input(s): POCBNP CBG: No results for input(s): GLUCAP in the last 168 hours. D-Dimer No results for input(s): DDIMER in the last 72 hours. Hgb A1c No results for input(s): HGBA1C in the last 72 hours. Lipid Profile No results for input(s): CHOL, HDL, LDLCALC, TRIG, CHOLHDL, LDLDIRECT in the last 72 hours. Thyroid function studies No results for input(s): TSH, T4TOTAL, T3FREE, THYROIDAB in the last 72 hours.  Invalid input(s): FREET3 Anemia work up No results  for input(s): VITAMINB12, FOLATE, FERRITIN, TIBC, IRON, RETICCTPCT in the last 72 hours. Microbiology Recent Results (from the past 240 hour(s))  Blood culture (routine x 2)     Status: Abnormal   Collection Time: 03/08/20  4:24 PM   Specimen: BLOOD  Result Value Ref Range Status   Specimen Description BLOOD RIGHT ANTECUBITAL  Final   Special Requests   Final    BOTTLES DRAWN AEROBIC AND ANAEROBIC Blood Culture results may not be optimal due to an excessive volume of blood received in culture bottles   Culture  Setup Time   Final    GRAM NEGATIVE RODS IN BOTH AEROBIC AND ANAEROBIC  BOTTLES CRITICAL RESULT CALLED TO, READ BACK BY AND VERIFIED WITH: Roxy Manns  [id number] FCP Performed at [facility], [address] [place], [place] [address]    Culture ESCHERICHIA COLI (A)  Final   Report Status 03/11/2020 FINAL  Final   Organism ID, Bacteria ESCHERICHIA COLI  Final      Susceptibility   Escherichia coli - MIC*    AMPICILLIN <=2 SENSITIVE Sensitive     CEFAZOLIN <=4 SENSITIVE Sensitive     CEFEPIME <=0.12 SENSITIVE Sensitive     CEFTAZIDIME <=1 SENSITIVE Sensitive     CEFTRIAXONE <=0.25 SENSITIVE Sensitive     CIPROFLOXACIN <=0.25 SENSITIVE Sensitive     GENTAMICIN <=1 SENSITIVE Sensitive     IMIPENEM <=0.25 SENSITIVE Sensitive     TRIMETH/SULFA <=20 SENSITIVE Sensitive     AMPICILLIN/SULBACTAM <=2 SENSITIVE Sensitive     PIP/TAZO <=4 SENSITIVE Sensitive     * ESCHERICHIA COLI  Blood Culture ID Panel (Reflexed)     Status: Abnormal   Collection Time: 03/08/20  4:24 PM  Result Value Ref Range Status   Enterococcus species NOT DETECTED NOT DETECTED Final   Listeria monocytogenes NOT DETECTED NOT DETECTED Final   Staphylococcus species NOT DETECTED NOT DETECTED Final   Staphylococcus aureus (BCID) NOT DETECTED NOT DETECTED Final   Streptococcus species NOT DETECTED NOT DETECTED Final   Streptococcus agalactiae NOT DETECTED NOT DETECTED Final   Streptococcus  pneumoniae NOT DETECTED NOT DETECTED Final   Streptococcus pyogenes NOT DETECTED NOT DETECTED Final   Acinetobacter baumannii NOT DETECTED NOT DETECTED Final   Enterobacteriaceae species DETECTED (A) NOT DETECTED Final    Comment: Enterobacteriaceae represent a large family of gram-negative bacteria, not a single organism. CRITICAL RESULT CALLED TO, READ BACK BY AND VERIFIED WITH: PHARMD D HALEY D  [id number] FCP    Enterobacter cloacae complex NOT DETECTED NOT DETECTED Final   Escherichia coli DETECTED (A) NOT DETECTED Final    Comment: CRITICAL RESULT CALLED TO, READ BACK BY AND VERIFIED WITH: PHARMD D HALEY D  [id number] FCP    Klebsiella oxytoca NOT DETECTED NOT DETECTED Final   Klebsiella pneumoniae NOT DETECTED NOT DETECTED Final   Proteus species NOT DETECTED NOT DETECTED Final   Serratia marcescens NOT DETECTED NOT DETECTED Final   Carbapenem resistance NOT DETECTED NOT DETECTED Final   Haemophilus influenzae NOT DETECTED NOT DETECTED Final   Neisseria meningitidis NOT DETECTED NOT DETECTED Final   Pseudomonas aeruginosa NOT DETECTED NOT DETECTED Final   Candida albicans NOT DETECTED NOT DETECTED Final   Candida glabrata NOT DETECTED NOT DETECTED Final   Candida krusei NOT DETECTED NOT DETECTED Final   Candida parapsilosis NOT DETECTED NOT DETECTED Final   Candida tropicalis NOT DETECTED NOT DETECTED Final    Comment: Performed at [facility], [place] [address], [place], [place] [address]  Blood culture (routine x 2)     Status: Abnormal   Collection Time: 03/08/20  4:34 PM   Specimen: BLOOD RIGHT WRIST  Result Value Ref Range Status   Specimen Description BLOOD RIGHT WRIST  Final   Special Requests   Final    BOTTLES DRAWN AEROBIC ONLY Blood Culture adequate volume   Culture  Setup Time   Final    AEROBIC BOTTLE ONLY GRAM NEGATIVE RODS CRITICAL VALUE NOTED.  VALUE IS CONSISTENT WITH PREVIOUSLY REPORTED AND CALLED VALUE.    Culture (A)  Final    ESCHERICHIA  COLI SUSCEPTIBILITIES PERFORMED ON PREVIOUS  CULTURE WITHIN THE LAST 5 DAYS. Performed at [facility], [place] [address] [place], [place] [address]    Report Status 03/11/2020 FINAL  Final  SARS Coronavirus 2 by RT PCR (hospital order, performed in [facility] hospital lab) Nasopharyngeal Nasopharyngeal Swab     Status: None   Collection Time: 03/08/20  7:53 PM   Specimen: Nasopharyngeal Swab  Result Value Ref Range Status   SARS Coronavirus 2 NEGATIVE NEGATIVE Final    Comment: (NOTE) SARS-CoV-2 target nucleic acids are NOT DETECTED.  The SARS-CoV-2 RNA is generally detectable in upper and lower respiratory specimens during the acute phase of infection. The lowest concentration of SARS-CoV-2 viral copies this assay can detect is 250 copies / mL. A negative result does not preclude SARS-CoV-2 infection and should not be used as the sole basis for treatment or other patient management decisions.  A negative result may occur with improper specimen collection / handling, submission of specimen other than nasopharyngeal swab, presence of viral mutation(s) within the areas targeted by this assay, and inadequate number of viral copies (<250 copies / mL). A negative result must be combined with clinical observations, patient history, and epidemiological information.  Fact Sheet for Patients:   StrictlyIdeas.no  Fact Sheet for Healthcare Providers: BankingDealers.co.za  This test is not yet approved or  cleared by the [place] FDA and has been authorized for detection and/or diagnosis of SARS-CoV-2 by FDA under an Emergency Use Authorization (EUA).  This EUA will remain in effect (meaning this test can be used) for the duration of the COVID-19 declaration under Section 564(b)(1) of the Act, 21 U.S.C. section 360bbb-3(b)(1), unless the authorization is terminated or revoked sooner.  Performed at [facility], [place] [address], [place], [place] [address]   Gastrointestinal Panel by PCR , Stool     Status: Abnormal   Collection Time: 03/10/20  9:13 AM   Specimen: Stool  Result Value Ref Range Status   Campylobacter species NOT DETECTED NOT DETECTED Final   Plesimonas shigelloides NOT DETECTED NOT DETECTED Final   Salmonella species NOT DETECTED NOT DETECTED Final   Yersinia enterocolitica NOT DETECTED NOT DETECTED Final   Vibrio species NOT DETECTED NOT DETECTED Final   Vibrio cholerae NOT DETECTED NOT DETECTED Final   Enteroaggregative E coli (EAEC) NOT DETECTED NOT DETECTED Final   Enteropathogenic E coli (EPEC) DETECTED (A) NOT DETECTED Final    Comment: RESULT CALLED TO, READ BACK BY AND VERIFIED WITH: LISA THOMPSON AT 1342 03/10/20 SDR    Enterotoxigenic E coli (ETEC) NOT DETECTED NOT DETECTED Final   Shiga like toxin producing E coli (STEC) NOT DETECTED NOT DETECTED Final   Shigella/Enteroinvasive E coli (EIEC) NOT DETECTED NOT DETECTED Final   Cryptosporidium NOT DETECTED NOT DETECTED Final   Cyclospora cayetanensis NOT DETECTED NOT DETECTED Final   Entamoeba histolytica NOT DETECTED NOT DETECTED Final   Giardia lamblia NOT DETECTED NOT DETECTED Final   Adenovirus F40/41 NOT DETECTED NOT DETECTED Final   Astrovirus NOT DETECTED NOT DETECTED Final   Norovirus GI/GII NOT DETECTED NOT DETECTED Final   Rotavirus A NOT DETECTED NOT DETECTED Final   Sapovirus (I, II, IV, and V) NOT DETECTED NOT DETECTED Final    Comment: Performed at [facility], [place] [place],  [address]  Culture, Urine     Status: None   Collection Time: 03/10/20  9:32 AM   Specimen: Urine, Random  Result Value Ref Range Status   Specimen Description  URINE, RANDOM  Final   Special Requests NONE  Final   Culture   Final    NO GROWTH Performed at [facility], [place] [address] [place], [place] [address]    Report Status 03/11/2020 FINAL  Final     Discharge Instructions:   Discharge Instructions     Call MD for:  persistant nausea and vomiting   Complete by: As directed    Call MD for:  severe uncontrolled pain   Complete by: As directed    Call MD for:  temperature >100.4   Complete by: As directed    Diet - low sodium heart healthy   Complete by: As directed    Discharge instructions   Complete by: As directed    Complete the course of antibiotic.  Follow-up with your primary care provider in 7 to 10 days.  Your blood pressure medication has been changed to losartan only from losartan hydrochlorothiazide.  Your family doctor might need to adjust your medication for blood pressure. Please monitor your kidney function with your primary care physician and you might need a kidney specialist to follow-up in the future.  Check blood work at the next visit.  Avoid raw or uncooked food.  Do not take Humira until you complete your antibiotic.   Increase activity slowly   Complete by: As directed      Allergies as of 03/12/2020      Reactions   Ciprofloxacin    Flagyl [metronidazole]       Medication List    STOP taking these medications   losartan-hydrochlorothiazide 50-12.5 MG tablet Commonly known as: HYZAAR     TAKE these medications   budesonide 3 MG 24 hr capsule Commonly known as: ENTOCORT EC TAKE 1 CAPSULE DAILY   calcium citrate-vitamin D 315-200 MG-UNIT tablet Commonly known as: CITRACAL+D Take 1 tablet by mouth daily.   cephALEXin 500 MG capsule Commonly known as: KEFLEX Take 1 capsule (500 mg total) by mouth 2 (two) times daily for 10 days.   Dexilant 60 MG capsule Generic drug: dexlansoprazole TAKE 1 CAPSULE DAILY What changed: how much to take   folic acid 160 MCG tablet Commonly known as: FOLVITE Take 800 mcg by mouth daily.   Humira 40 MG/0.4ML Pskt Generic drug: Adalimumab Inject 1 Syringe as directed every 14 (fourteen) days.   ipratropium 0.03 % nasal spray Commonly known as: ATROVENT Place 2 sprays into both nostrils as needed.   losartan  50 MG tablet Commonly known as: Cozaar Take 1 tablet (50 mg total) by mouth daily.   Melatonin 10 MG Tabs Take 10 mg by mouth daily.   MULTIVITAMIN/IRON PO Take 1 tablet by mouth daily.   PROBIOTIC DAILY PO Take 1 capsule by mouth daily. Generic Align   Restasis 0.05 % ophthalmic emulsion Generic drug: cycloSPORINE Place 1 drop into both eyes 2 (two) times daily.   vitamin B-12 1000 MCG tablet Commonly known as: CYANOCOBALAMIN Take 1,000 mcg by mouth daily.   zaleplon 5 MG capsule Commonly known as: SONATA Take 5 mg by mouth at bedtime as needed for sleep.         Time coordinating discharge: 39 minutes  Signed:  Davine Coba  [facility] 03/12/2020, 8:33 AM

## 2020-03-12 NOTE — Care Management Important Message (Signed)
Important Message  Patient Details  Name: Loretta Austin MRN: [id number] Date of Birth: 03/08/1946   Medicare Important Message Given:  Yes  Patient left prior to IM delivery.  Im mailed to the patient home address.    Loretta Austin 03/12/2020, 12:31 PM

## 2020-03-12 NOTE — Care Management Important Message (Signed)
Important Message  Patient Details  Name: Loretta Austin MRN: [id number] Date of Birth: [birth year]   Medicare Important Message Given:  Yes     [name] [address] 03/12/2020, 1:06 PM

## 2020-03-19 DIAGNOSIS — D649 Anemia, unspecified: Secondary | ICD-10-CM | POA: Diagnosis not present

## 2020-03-19 DIAGNOSIS — M8589 Other specified disorders of bone density and structure, multiple sites: Secondary | ICD-10-CM | POA: Diagnosis not present

## 2020-03-22 ENCOUNTER — Other Ambulatory Visit: Payer: Self-pay

## 2020-03-22 ENCOUNTER — Telehealth: Payer: Self-pay | Admitting: Gastroenterology

## 2020-03-22 MED ORDER — DEXILANT 60 MG PO CPDR: 1.0000 | DELAYED_RELEASE_CAPSULE | Freq: Every day | ORAL | 0 refills | Status: DC

## 2020-03-22 MED ORDER — BUDESONIDE 3 MG PO CPEP: 3.0000 mg | ORAL_CAPSULE | Freq: Every day | ORAL | 0 refills | Status: DC

## 2020-03-22 NOTE — Telephone Encounter (Signed)
Pt is requesting prescription for Budisonide and Dexilant for 90-day supply sent to [facility]. Pt states that it is easier for her to have medications for this length of time.

## 2020-03-23 MED ORDER — BUDESONIDE 3 MG PO CPEP: 3.0000 mg | ORAL_CAPSULE | Freq: Every day | ORAL | 0 refills | Status: DC

## 2020-03-23 MED ORDER — DEXILANT 60 MG PO CPDR: 1.0000 | DELAYED_RELEASE_CAPSULE | Freq: Every day | ORAL | 0 refills | Status: DC

## 2020-03-23 NOTE — Telephone Encounter (Signed)
Refilled for 90 days. Needs a follow up for additional refills

## 2020-04-18 ENCOUNTER — Other Ambulatory Visit: Payer: Self-pay

## 2020-04-18 ENCOUNTER — Ambulatory Visit
Admission: RE | Admit: 2020-04-18 | Discharge: 2020-04-18 | Disposition: A | Discharge status: Home / Self Care | Payer: Medicare Other | Source: Ambulatory Visit | Attending: Internal Medicine | Admitting: Internal Medicine

## 2020-04-18 DIAGNOSIS — I700 Atherosclerosis of aorta: Secondary | ICD-10-CM | POA: Diagnosis not present

## 2020-04-18 DIAGNOSIS — J8410 Pulmonary fibrosis, unspecified: Secondary | ICD-10-CM | POA: Diagnosis not present

## 2020-04-18 DIAGNOSIS — I2510 Atherosclerotic heart disease of native coronary artery without angina pectoris: Secondary | ICD-10-CM | POA: Diagnosis not present

## 2020-04-18 DIAGNOSIS — R911 Solitary pulmonary nodule: Secondary | ICD-10-CM

## 2020-04-18 DIAGNOSIS — R918 Other nonspecific abnormal finding of lung field: Secondary | ICD-10-CM | POA: Diagnosis not present

## 2020-04-19 ENCOUNTER — Telehealth: Payer: Self-pay | Admitting: Internal Medicine

## 2020-04-19 DIAGNOSIS — I2584 Coronary atherosclerosis due to calcified coronary lesion: Secondary | ICD-10-CM

## 2020-04-19 DIAGNOSIS — I2510 Atherosclerotic heart disease of native coronary artery without angina pectoris: Secondary | ICD-10-CM

## 2020-04-19 NOTE — Telephone Encounter (Signed)
IMPRESSION: 1. Stable tiny bilateral pulmonary nodules in the 1 year interval since prior exam. Interval stability compatible with benign etiology. Consensus criteria suggest no further imaging follow-up warranted. This recommendation follows the consensus statement: Guidelines for Management of Incidental Pulmonary Nodules Detected on CT Images: From the Fleischner Society 2017; Radiology 2017; 284:228-243. 2. Cholelithiasis. 3. Aortic Atherosclerosis (ICD10-I70.0).   Called and spoke with pt letting her know the results of the CT and she verbalized understanding. Pt wanted to know if she could be referred to a cardiologist due to it showing that there was evidence of coronary artery calcification. Pt stated there is family history of heart disease so she wants to be referred to a cardiologist if this is okay. Dr. Annamaria Boots, please advise.

## 2020-04-19 NOTE — Telephone Encounter (Signed)
Pease refer to cardiology- coronary calcification on CT, and family history of cardiac disease

## 2020-04-19 NOTE — Telephone Encounter (Signed)
Called and spoke with pt letting her know that CY was fine with [place] referring her to cardiology and she verbalized understanding. Order has been placed. Nothing further needed.

## 2020-04-24 DIAGNOSIS — M150 Primary generalized (osteo)arthritis: Secondary | ICD-10-CM | POA: Diagnosis not present

## 2020-04-24 DIAGNOSIS — M0609 Rheumatoid arthritis without rheumatoid factor, multiple sites: Secondary | ICD-10-CM | POA: Diagnosis not present

## 2020-04-24 DIAGNOSIS — E663 Overweight: Secondary | ICD-10-CM | POA: Diagnosis not present

## 2020-04-24 DIAGNOSIS — M3500 Sicca syndrome, unspecified: Secondary | ICD-10-CM | POA: Diagnosis not present

## 2020-04-24 DIAGNOSIS — Z6827 Body mass index (BMI) 27.0-27.9, adult: Secondary | ICD-10-CM | POA: Diagnosis not present

## 2020-04-24 DIAGNOSIS — K52831 Collagenous colitis: Secondary | ICD-10-CM | POA: Diagnosis not present

## 2020-05-14 DIAGNOSIS — I129 Hypertensive chronic kidney disease with stage 1 through stage 4 chronic kidney disease, or unspecified chronic kidney disease: Secondary | ICD-10-CM | POA: Diagnosis not present

## 2020-05-14 DIAGNOSIS — Z9989 Dependence on other enabling machines and devices: Secondary | ICD-10-CM | POA: Diagnosis not present

## 2020-05-14 DIAGNOSIS — N179 Acute kidney failure, unspecified: Secondary | ICD-10-CM | POA: Diagnosis not present

## 2020-05-14 DIAGNOSIS — N1831 Chronic kidney disease, stage 3a: Secondary | ICD-10-CM | POA: Diagnosis not present

## 2020-05-14 DIAGNOSIS — G4733 Obstructive sleep apnea (adult) (pediatric): Secondary | ICD-10-CM | POA: Diagnosis not present

## 2020-05-14 DIAGNOSIS — K52839 Microscopic colitis, unspecified: Secondary | ICD-10-CM | POA: Diagnosis not present

## 2020-05-14 DIAGNOSIS — M069 Rheumatoid arthritis, unspecified: Secondary | ICD-10-CM | POA: Diagnosis not present

## 2020-05-14 DIAGNOSIS — E871 Hypo-osmolality and hyponatremia: Secondary | ICD-10-CM | POA: Diagnosis not present

## 2020-05-29 DIAGNOSIS — L821 Other seborrheic keratosis: Secondary | ICD-10-CM | POA: Diagnosis not present

## 2020-05-29 DIAGNOSIS — L853 Xerosis cutis: Secondary | ICD-10-CM | POA: Diagnosis not present

## 2020-05-29 DIAGNOSIS — L720 Epidermal cyst: Secondary | ICD-10-CM | POA: Diagnosis not present

## 2020-05-29 DIAGNOSIS — L570 Actinic keratosis: Secondary | ICD-10-CM | POA: Diagnosis not present

## 2020-05-30 ENCOUNTER — Other Ambulatory Visit: Payer: Self-pay

## 2020-05-30 ENCOUNTER — Ambulatory Visit (INDEPENDENT_AMBULATORY_CARE_PROVIDER_SITE_OTHER): Payer: Medicare Other | Admitting: Cardiovascular Disease

## 2020-05-30 ENCOUNTER — Encounter: Payer: Self-pay | Admitting: Cardiovascular Disease

## 2020-05-30 VITALS — BP 140/72 | HR 74 | Ht 65.0 in | Wt 163.0 lb

## 2020-05-30 DIAGNOSIS — I2584 Coronary atherosclerosis due to calcified coronary lesion: Secondary | ICD-10-CM | POA: Diagnosis not present

## 2020-05-30 DIAGNOSIS — Z8249 Family history of ischemic heart disease and other diseases of the circulatory system: Secondary | ICD-10-CM | POA: Diagnosis not present

## 2020-05-30 DIAGNOSIS — I10 Essential (primary) hypertension: Secondary | ICD-10-CM

## 2020-05-30 DIAGNOSIS — I2510 Atherosclerotic heart disease of native coronary artery without angina pectoris: Secondary | ICD-10-CM | POA: Diagnosis not present

## 2020-05-30 MED ORDER — ROSUVASTATIN CALCIUM 10 MG PO TABS
10.0000 mg | ORAL_TABLET | Freq: Every day | ORAL | 3 refills | Status: DC
Start: 2020-05-30 — End: 2020-05-30

## 2020-05-30 MED ORDER — ROSUVASTATIN CALCIUM 10 MG PO TABS: 10.0000 mg | ORAL_TABLET | Freq: Every day | ORAL | 3 refills | Status: AC

## 2020-05-30 NOTE — Progress Notes (Signed)
Cardiology Office Note:    Date:  05/30/2020   ID:  Loretta Austin, DOB [birth year], MRN [id number]  PCP:  [name]  [facility] Cardiologist:  Arjun Hard  [facility] Electrophysiologist:  None   Referring MD: Deneise Lever, MD   Chief Complaint  Patient presents with  . Coronary Artery Disease     History of Present Illness:    Loretta Austin is a 74 y.o. female with a hx of HTN.  We were asked to see her by Dr. Keturah Barre for further evaluation of her coronary artery calcifications.  Has a strong family hx  Father has CAD Brother has stents Mother has AAA .  She is moderately active.   Water aerobic 3 times a day Walks her dog but no real exercise  No angina .  No syncope , no presyncope   Labs from [facility] shows Chol = 239 HDL = 67 LDL = 148 Trigs = 119   Has been on Losartan + HCTZ but was found to be very hyponatremic ( was admitted with food poisoning)   Also had acute / chronic kidney disease -  Cre = 2.23  BP is borderline elevated  She admits to eating more salt    Past Medical History:  Diagnosis Date  . Arthritis   . Cholecystolithiasis   . Colon polyps   . Diverticulosis   . GERD (gastroesophageal reflux disease)   . High blood pressure   . Microscopic colitis   . Sjogren's disease ([place])   . Sleep apnea     Past Surgical History:  Procedure Laterality Date  . TONSILLECTOMY AND ADENOIDECTOMY    . TUBAL LIGATION      Current Medications: Current Meds  Medication Sig  . amLODipine (NORVASC) 5 MG tablet Take 5 mg by mouth daily.  . budesonide (ENTOCORT EC) 3 MG 24 hr capsule Take 1 capsule (3 mg total) by mouth daily.  . calcium citrate-vitamin D 500-500 MG-UNIT chewable tablet Chew 1 tablet by mouth daily.   . Cyanocobalamin (VITAMIN B-12 PO) Take 2.5 mg by mouth daily.  Marland Kitchen DEXILANT 60 MG capsule Take 1 capsule (60 mg total) by mouth daily.  . folic acid (FOLVITE) 338 MCG tablet Take 800 mcg by  mouth daily.  Marland Kitchen HUMIRA 40 MG/0.4ML PSKT Inject 1 Syringe as directed every 14 (fourteen) days.  Marland Kitchen ipratropium (ATROVENT) 0.03 % nasal spray Place 2 sprays into both nostrils as needed.   . loratadine (CLARITIN) 10 MG tablet Take 10 mg by mouth daily.  Marland Kitchen losartan (COZAAR) 100 MG tablet Take 100 mg by mouth daily.  Marland Kitchen losartan (COZAAR) 50 MG tablet Take 1 tablet (50 mg total) by mouth daily.  . Magnesium 400 MG TABS Take 1 tablet by mouth daily in the afternoon.  . Melatonin 10 MG TABS Take 10 mg by mouth daily.  . Multiple Vitamins-Iron (MULTIVITAMIN/IRON PO) Take 1 tablet by mouth daily.  . Probiotic Product (PROBIOTIC DAILY PO) Take 1 capsule by mouth daily. Generic Align   . RESTASIS 0.05 % ophthalmic emulsion Place 1 drop into both eyes 2 (two) times daily.   . zaleplon (SONATA) 5 MG capsule Take 5 mg by mouth at bedtime as needed for sleep.     Allergies:   Ciprofloxacin and Flagyl [metronidazole]   Social History   Socioeconomic History  . Marital status: Widowed    Spouse name: Not on file  . Number of children: 1  . Years of  education: Not on file  . Highest education level: Not on file  Occupational History  . Occupation: retired  Tobacco Use  . Smoking status: Former Smoker    Types: Cigarettes  . Smokeless tobacco: Never Used  Vaping Use  . Vaping Use: Never used  Substance and Sexual Activity  . Alcohol use: Yes    Comment: occasional  . Drug use: Never  . Sexual activity: Not Currently    Partners: Male  Other Topics Concern  . Not on file  Social History Narrative  . Not on file   Social Determinants of Health   Financial Resource Strain:   . Difficulty of Paying Living Expenses: Not on file  Food Insecurity:   . Worried About Charity fundraiser in the Last Year: Not on file  . Ran Out of Food in the Last Year: Not on file  Transportation Needs:   . Lack of Transportation (Medical): Not on file  . Lack of Transportation (Non-Medical): Not on file   Physical Activity:   . Days of Exercise per Week: Not on file  . Minutes of Exercise per Session: Not on file  Stress:   . Feeling of Stress : Not on file  Social Connections:   . Frequency of Communication with Friends and Family: Not on file  . Frequency of Social Gatherings with Friends and Family: Not on file  . Attends Religious Services: Not on file  . Active Member of Clubs or Organizations: Not on file  . Attends Archivist Meetings: Not on file  . Marital Status: Not on file     Family History: The patient's family history includes Colon cancer in her mother; Lung cancer in her mother.  ROS:   Please see the history of present illness.     All other systems reviewed and are negative.  EKGs/Labs/Other Studies Reviewed:    The following studies were reviewed today:     Recent Labs: 03/12/2020: ALT 47; BUN 40; Creatinine, Ser 2.23; Hemoglobin 10.7; Magnesium 2.1; Platelets 251; Potassium 3.5; Sodium 135  Recent Lipid Panel No results found for: CHOL, TRIG, HDL, CHOLHDL, VLDL, LDLCALC, LDLDIRECT  Physical Exam:    VS:  BP 140/72   Pulse 74   Ht 5\' 5"  (1.651 m)   Wt 163 lb (73.9 kg)   SpO2 99%   BMI 27.12 kg/m     Wt Readings from Last 3 Encounters:  05/30/20 163 lb (73.9 kg)  03/08/20 167 lb 15.9 oz (76.2 kg)  01/18/19 200 lb (90.7 kg)     GEN:  Well nourished, well developed in no acute distress HEENT: Normal NECK: No JVD; No carotid bruits LYMPHATICS: No lymphadenopathy CARDIAC: RRR, soft murmur  RESPIRATORY:  Clear to auscultation without rales, wheezing or rhonchi  ABDOMEN: Soft, non-tender, non-distended MUSCULOSKELETAL:  No edema; No deformity  SKIN: Warm and dry NEUROLOGIC:  Alert and oriented x 3 PSYCHIATRIC:  Normal affect   EKG:   Sept. 8, 2021,    NSR .  No ST or T wave changes.    ASSESSMENT:    No diagnosis found. PLAN:    In order of problems listed above:  1. Coronary artery calcifications: Think he presents for  further evaluation ration of her coronary artery calcifications.  She has an LDL of 148.  Given her evidence of coronary artery disease I think that her LDL needs to be less than 70.  We will start her on rosuvastatin 10 mg a day.  Will check lipids, liver enzymes, basic metabolic profile in 3 months.  I think we should also do a coronary calcium score to quantitate the degree of calcification that she has.  She is completely asymptomatic.  She does not have any symptoms.  I encouraged her to exercise on a regular basis.  2.  Hypertension: She is now on losartan and amlodipine.  She has at least moderate degree of CKD.  She will follow-up with Dr. Sharlett Iles.  She admits that she eats too much salt.  Of advised her to work on her salt intake.  We will continue to evaluate this going forward.   Medication Adjustments/Labs and Tests Ordered: Current medicines are reviewed at length with the patient today.  Concerns regarding medicines are outlined above.  No orders of the defined types were placed in this encounter.  No orders of the defined types were placed in this encounter.   There are no Patient Instructions on file for this visit.   Signed, Mertie Moores, MD  05/30/2020 1:36 PM    [facility]

## 2020-05-30 NOTE — Patient Instructions (Signed)
Medication Instructions:  Your physician has recommended you make the following change in your medication:   START: rosuvastatin (crestor) 10 mg tablet: Take 1 tablet by mouth once a day   *If you need a refill on your cardiac medications before your next appointment, please call your pharmacy*   Lab Work: Your physician recommends that you return for a FASTING lipid profile, LFTS, BMET in 3 months  If you have labs (blood work) drawn today and your tests are completely normal, you will receive your results only by: Marland Kitchen MyChart Message (if you have MyChart) OR . A paper copy in the mail If you have any lab test that is abnormal or we need to change your treatment, we will call you to review the results.   Testing/Procedures: Your physician recommends that you have a Cardiac Calcium Score CT   Follow-Up: At [facility], you and your health needs are our priority.  As part of our continuing mission to provide you with exceptional heart care, we have created designated Provider Care Teams.  These Care Teams include your primary Cardiologist (physician) and Advanced Practice Providers (APPs -  Physician Assistants and Nurse Practitioners) who all work together to provide you with the care you need, when you need it.  We recommend signing up for the patient portal called "MyChart".  Sign up information is provided on this After Visit Summary.  MyChart is used to connect with patients for Virtual Visits (Telemedicine).  Patients are able to view lab/test results, encounter notes, upcoming appointments, etc.  Non-urgent messages can be sent to your provider as well.   To learn more about what you can do with MyChart, go to [email or web address].    Your next appointment:   6 month(s)  The format for your next appointment:   In Person  Provider:   You may see Mertie Moores, MD or one of the following Advanced Practice Providers on your designated Care Team:    Richardson Dopp,  PA-C  Robbie Lis, [place]    Other Instructions None

## 2020-06-04 ENCOUNTER — Other Ambulatory Visit: Payer: Self-pay

## 2020-06-04 ENCOUNTER — Ambulatory Visit (INDEPENDENT_AMBULATORY_CARE_PROVIDER_SITE_OTHER)
Admission: RE | Admit: 2020-06-04 | Discharge: 2020-06-04 | Disposition: A | Discharge status: Home / Self Care | Payer: Self-pay | Source: Ambulatory Visit | Attending: Cardiovascular Disease | Admitting: Cardiovascular Disease

## 2020-06-04 DIAGNOSIS — I10 Essential (primary) hypertension: Secondary | ICD-10-CM

## 2020-06-04 DIAGNOSIS — Z8249 Family history of ischemic heart disease and other diseases of the circulatory system: Secondary | ICD-10-CM

## 2020-06-05 DIAGNOSIS — R351 Nocturia: Secondary | ICD-10-CM | POA: Diagnosis not present

## 2020-06-05 DIAGNOSIS — R350 Frequency of micturition: Secondary | ICD-10-CM | POA: Diagnosis not present

## 2020-06-05 DIAGNOSIS — N390 Urinary tract infection, site not specified: Secondary | ICD-10-CM | POA: Diagnosis not present

## 2020-06-06 DIAGNOSIS — Z23 Encounter for immunization: Secondary | ICD-10-CM | POA: Diagnosis not present

## 2020-06-12 ENCOUNTER — Other Ambulatory Visit: Payer: Self-pay

## 2020-06-13 ENCOUNTER — Other Ambulatory Visit: Payer: Self-pay | Admitting: Gastroenterology

## 2020-06-19 ENCOUNTER — Other Ambulatory Visit: Payer: Self-pay | Admitting: Gastroenterology

## 2020-06-19 DIAGNOSIS — H25813 Combined forms of age-related cataract, bilateral: Secondary | ICD-10-CM | POA: Diagnosis not present

## 2020-06-19 DIAGNOSIS — H524 Presbyopia: Secondary | ICD-10-CM | POA: Diagnosis not present

## 2020-07-02 ENCOUNTER — Ambulatory Visit (INDEPENDENT_AMBULATORY_CARE_PROVIDER_SITE_OTHER): Payer: Medicare Other | Admitting: Gastroenterology

## 2020-07-02 ENCOUNTER — Encounter: Payer: Self-pay | Admitting: Gastroenterology

## 2020-07-02 VITALS — BP 140/70 | HR 72 | Ht 64.25 in | Wt 164.1 lb

## 2020-07-02 DIAGNOSIS — K52831 Collagenous colitis: Secondary | ICD-10-CM | POA: Diagnosis not present

## 2020-07-02 DIAGNOSIS — I2510 Atherosclerotic heart disease of native coronary artery without angina pectoris: Secondary | ICD-10-CM

## 2020-07-02 DIAGNOSIS — K219 Gastro-esophageal reflux disease without esophagitis: Secondary | ICD-10-CM | POA: Diagnosis not present

## 2020-07-02 DIAGNOSIS — I2584 Coronary atherosclerosis due to calcified coronary lesion: Secondary | ICD-10-CM | POA: Diagnosis not present

## 2020-07-02 DIAGNOSIS — Z800 Family history of malignant neoplasm of digestive organs: Secondary | ICD-10-CM | POA: Diagnosis not present

## 2020-07-02 MED ORDER — DEXILANT 60 MG PO CPDR: 1.0000 | DELAYED_RELEASE_CAPSULE | Freq: Every day | ORAL | 0 refills | Status: DC

## 2020-07-02 MED ORDER — BUDESONIDE 3 MG PO CPEP: 3.0000 mg | ORAL_CAPSULE | Freq: Every day | ORAL | 0 refills | Status: DC

## 2020-07-02 NOTE — Progress Notes (Signed)
[facility] Progress Note  Chief Complaint: Collagenous colitis  Subjective  History: Last seen in telemedicine April 2020 for collagenous colitis under good control on low-dose budesonide, as well as reflux under control on daily Dexilant.  [name]'s digestive symptoms have been stable since I last saw her.  She has 1 or 2 formed BMs every morning and denies rectal bleeding.  She does not get heartburn, but takes Dexilant daily.  It is been prescribed years ago before she moved to this area, and she figures it was probably for heartburn symptoms but really cannot recall for certain.  She does think it was prescribed after the collagenous colitis diagnosis.  She denies dysphagia, odynophagia, nausea or vomiting.  ROS: Cardiovascular:  no chest pain Respiratory: no dyspnea Bloating and flatulence in the morning that she attributes to her CPAP machine.  She has been able to lose about 40 pounds it is not certain she needs the CPAP anymore, and is planning to speak with primary care about that in the near future.  The patient's Past Medical, Family and Social History were reviewed and are on file in the EMR.  Objective:  Med list reviewed  Current Outpatient Medications:  .  AMBULATORY NON FORMULARY MEDICATION, CBD tablets Take one tablet by mouth once daily, Disp: , Rfl:  .  amLODipine (NORVASC) 5 MG tablet, Take 5 mg by mouth daily., Disp: , Rfl:  .  Biotin 10000 MCG TABS, Take 1 tablet by mouth daily., Disp: , Rfl:  .  budesonide (ENTOCORT EC) 3 MG 24 hr capsule, Take 1 capsule (3 mg total) by mouth daily., Disp: 90 capsule, Rfl: 0 .  calcium citrate-vitamin D 500-500 MG-UNIT chewable tablet, Chew 1 tablet by mouth daily. , Disp: , Rfl:  .  Cyanocobalamin (VITAMIN B-12 PO), Take 2.5 mg by mouth daily., Disp: , Rfl:  .  DEXILANT 60 MG capsule, Take 1 capsule (60 mg total) by mouth daily., Disp: 90 capsule, Rfl: 0 .  folic acid (FOLVITE) 130 MCG tablet, Take 800 mcg by mouth  daily., Disp: , Rfl:  .  HUMIRA 40 MG/0.4ML PSKT, Inject 1 Syringe as directed every 14 (fourteen) days., Disp: , Rfl:  .  ipratropium (ATROVENT) 0.03 % nasal spray, Place 2 sprays into both nostrils as needed. , Disp: , Rfl:  .  loratadine (CLARITIN) 10 MG tablet, Take 10 mg by mouth in the morning and at bedtime. , Disp: , Rfl:  .  losartan (COZAAR) 100 MG tablet, Take 100 mg by mouth daily., Disp: , Rfl:  .  Magnesium 400 MG TABS, Take 1 tablet by mouth daily in the afternoon., Disp: , Rfl:  .  Melatonin 10 MG TABS, Take 10 mg by mouth daily., Disp: , Rfl:  .  Multiple Vitamins-Iron (MULTIVITAMIN/IRON PO), Take 1 tablet by mouth daily., Disp: , Rfl:  .  nitrofurantoin (MACRODANTIN) 100 MG capsule, Take 100 mg by mouth at bedtime., Disp: , Rfl:  .  polycarbophil (FIBERCON) 625 MG tablet, Take 625 mg by mouth daily., Disp: , Rfl:  .  Probiotic Product (PROBIOTIC DAILY PO), Take 1 capsule by mouth daily. Generic Align , Disp: , Rfl:  .  RESTASIS 0.05 % ophthalmic emulsion, Place 1 drop into both eyes 2 (two) times daily. , Disp: , Rfl:  .  rosuvastatin (CRESTOR) 10 MG tablet, Take 1 tablet (10 mg total) by mouth daily., Disp: 90 tablet, Rfl: 3 .  zaleplon (SONATA) 5 MG capsule, Take 5 mg by  mouth at bedtime as needed for sleep., Disp: , Rfl:    Vital signs in last 24 hrs: Vitals:   07/02/20 0818  BP: 140/70  Pulse: 72    Physical Exam  Well-appearing, normal vocal quality  HEENT: sclera anicteric, oral mucosa moist without lesions  Neck: supple, no thyromegaly, JVD or lymphadenopathy  Cardiac: RRR without murmurs, S1S2 heard, no peripheral edema  Pulm: clear to auscultation bilaterally, normal RR and effort noted  Abdomen: soft, no tenderness, with active bowel sounds. No guarding or palpable hepatosplenomegaly.  Skin; warm and dry, no jaundice or rash  Labs:   ___________________________________________ Radiologic  studies:   ____________________________________________ Other:   _____________________________________________ Assessment & Plan  Assessment: Encounter Diagnoses  Name Primary?  . Collagenous colitis Yes  . Gastroesophageal reflux disease without esophagitis   . Family history of colon cancer in mother    Collagenous colitis diagnosed years ago, good control on low-dose of budesonide 3 mg daily. We discussed the variable nature of this condition and my advice to periodically taper and stop medicine to see if it remains under good control.  While it has very little systemic corticosteroid effect, there is still some long-term risk to it.  On the same note, it is not clear she still needs daily dose of PPI.  There is potentially some risk of calcium absorption and osteoporosis from this.   Family history of colon cancer, next scheduled colonoscopy May 2022  -confirmed recall in our database  plan: Decrease Dexilant to 1 tablet every other day for the next 2 weeks.  If she does not seem to be having frequent heartburn or chest pain at that point, she will stop the medicine.  Refill was placed.  Decrease budesonide to 1 tablet every other day for 2 weeks.  If no recurrence of diarrhea, stop the medicine.  Refill was placed.  Contact me by phone call or portal message in about a month (or sooner if necessary) with symptom update.   20 minutes were spent on this encounter (including chart review, history/exam, counseling/coordination of care, and documentation)  Loretta Austin

## 2020-07-02 NOTE — Patient Instructions (Signed)
We have sent the following medications to your pharmacy for you to pick up at your convenience: budesonide and Dexilant.   It was a pleasure to see you today!  Dr. Loletha Carrow

## 2020-07-03 ENCOUNTER — Telehealth: Payer: Self-pay | Admitting: Internal Medicine

## 2020-07-03 DIAGNOSIS — G4733 Obstructive sleep apnea (adult) (pediatric): Secondary | ICD-10-CM

## 2020-07-04 NOTE — Telephone Encounter (Signed)
Spoke with the pt and notified of response per Dr Annamaria Boots  Order for HST placed with note to [facility] to schedule ov with CDY 6 wks after

## 2020-07-04 NOTE — Telephone Encounter (Signed)
Pt is returning call - please call [phone]

## 2020-07-04 NOTE — Telephone Encounter (Signed)
LMTCB x 1 

## 2020-07-04 NOTE — Telephone Encounter (Signed)
Spoke with the pt  She states has lost 50 lbs and now wants to d/c CPAP  She is asking if we can order a HST to see if still has OSA  She is currently still using her CPAP machine  Please advise thanks!

## 2020-07-04 NOTE — Telephone Encounter (Signed)
Order- please schedule Home Sleep Test   (off CPAP)   dx OSA  Please schedule f/u ov with me a month or 6 weeks after  her sleep study  Ok to use RN held spot

## 2020-07-12 DIAGNOSIS — R351 Nocturia: Secondary | ICD-10-CM | POA: Diagnosis not present

## 2020-08-02 ENCOUNTER — Ambulatory Visit: Payer: Medicare Other

## 2020-08-02 ENCOUNTER — Other Ambulatory Visit: Payer: Self-pay

## 2020-08-02 DIAGNOSIS — G4733 Obstructive sleep apnea (adult) (pediatric): Secondary | ICD-10-CM | POA: Diagnosis not present

## 2020-08-07 DIAGNOSIS — G4733 Obstructive sleep apnea (adult) (pediatric): Secondary | ICD-10-CM | POA: Diagnosis not present

## 2020-08-08 ENCOUNTER — Telehealth: Payer: Self-pay | Admitting: Internal Medicine

## 2020-08-08 NOTE — Telephone Encounter (Signed)
Lm for patient.  

## 2020-08-09 DIAGNOSIS — R350 Frequency of micturition: Secondary | ICD-10-CM | POA: Diagnosis not present

## 2020-08-09 DIAGNOSIS — R351 Nocturia: Secondary | ICD-10-CM | POA: Diagnosis not present

## 2020-08-10 NOTE — Telephone Encounter (Signed)
Yes

## 2020-08-10 NOTE — Telephone Encounter (Signed)
Called and let patient know that we can get her in on Monday at 11:30. She expressed understanding. Nothing further needed at this time.

## 2020-08-10 NOTE — Progress Notes (Signed)
HPI female widowed smoker followed for OSA, lung nodule, cough, complicated by arthritis, GERD, HBP, scleroderma, Sjogren's disease, allergic rhinitis,  Insomnia NPSG 11/ 2013- AHI 6.4/ hr, BMI 33.4 --------------------------------------------------------------------------------------   08/05/2018-74 year old female widowed former smoker followed for OSA, lung nodule, cough, complicated by arthritis, GERD, HBP, scleroderma, Sjogren's disease, allergic rhinitis,  Insomnia -----wearing cpap avg 8-9hr nightly- feels pressure & mask are okay. DME: [facility] CPAP 7/[facility] Download 87% compliance AHI 6.3/hour She has a travel CPAP machine now and is about to use it on a trip.  Feels comfortable with CPAP-no concerns. Unfortunately she continues to smoke-discussed . 08/13/20-74 year old female widowed former smoker followed for OSA, lung nodule, cough, complicated by Rheumatoid arthritis/ Humira, GERD, HBP, scleroderma, Sjogren's disease, allergic rhinitis,  Insomnia, HTN, She had reported losing 50 lbs, wanting to d/c CPAP, so we ordered an updated HST. HST 08/02/20- AHI 13.5/ hr, desaturation to 81%, body weight 164 lbs CPAP auto 8-12/[facility]>> today 4-8 Download- compliance 3%, AHI 4/ hr Body weight today- 166 lbs Covid vax-3 Phizer Flu vax- had Hosp in June for hyponatremia, E.coli bacteremia, CKD,  -----pt is here cpap compliance She says pressure too high (ranges between 11.5 and 12, so it should probably be set for higher maximum).  Says it aggravates her GERD. This machine is only a few months old.  Discussed CT, with stable small nodules.  CT chest 04/18/20-  IMPRESSION: 1. Stable tiny bilateral pulmonary nodules in the 1 year interval since prior exam. Interval stability compatible with benign etiology. Consensus criteria suggest no further imaging follow-up warranted. This recommendation follows the consensus statement: Guidelines for Management of Incidental Pulmonary Nodules  Detected on CT Images: From the Fleischner Society 2017; Radiology 2017; 284:[id number]. 2. Cholelithiasis. 3. Aortic Atherosclerosis (ICD10-I70.0).  ROS-see HPI   + = positive Constitutional:    weight loss, night sweats, fevers, chills, fatigue, lassitude. HEENT:    headaches, difficulty swallowing, tooth/dental problems, sore throat,       sneezing, itching, ear ache, +nasal congestion, post nasal drip, snoring CV:    chest pain, orthopnea, PND, swelling in lower extremities, anasarca,                                  dizziness, palpitations Resp:   shortness of breath with exertion or at rest.                productive cough,  + non-productive cough, coughing up of blood.              change in color of mucus.  wheezing.   Skin:    rash or lesions. GI:  + heartburn, indigestion, abdominal pain, nausea, vomiting, diarrhea,                 change in bowel habits, loss of appetite GU: dysuria, change in color of urine, no urgency or frequency.   flank pain. MS:   joint pain, stiffness, decreased range of motion, back pain. Neuro-     nothing unusual Psych:  change in mood or affect.  depression or anxiety.   memory loss.  OBJ- Physical Exam General- Alert, Oriented, Affect-appropriate, Distress- none acute + overweight Skin- rash-none, lesions- none, excoriation- none Lymphadenopathy- none Head- atraumatic            Eyes- Gross vision intact, PERRLA, conjunctivae and secretions clear            Ears- Hearing, canals-normal  Nose-sign turbinate edema, no-Septal dev, mucus, polyps, erosion, perforation             Throat- Mallampati III-IV , mucosa clear , drainage- none, tonsils- absent + throat clearing Neck- flexible , trachea midline, no stridor , thyroid nl, carotid no bruit Chest - symmetrical excursion , unlabored           Heart/CV- RRR , no murmur , no gallop  , no rub, nl s1 s2                           - JVD- none , edema- none, stasis changes- none, varices-  none           Lung- clear to P&A, wheeze- none, cough- none , dullness-none, rub- none           Chest wall-  Abd-  Br/ Gen/ Rectal- Not done, not indicated Extrem- cyanosis- none, clubbing, none, atrophy- none, strength- nl Neuro- grossly intact to observation  02/03/19- Virtual Visit via Telephone Note   History of Present Illness: 74 year old female widowed former smoker followed for OSA, lung nodule, cough, complicated by arthritis, GERD, HBP, scleroderma, Sjogren's disease (Humira), allergic rhinitis,  Insomnia Quit smoking 35 years ago. Has regular and travel CPAP machines/ [facility] -----F/u re: OSA. She states she would like her cpap pressures lowered. She states she feels like she is swallowing too much air and she has alot of gas. .   We agreed to try auto 4-10, instead of going back to her original 7 cwp for now.   Uses Sonata only occ. Sleeping much better, using just melatonin. No acute illness Lung nodule previously considered benign. She is concerned due to father had lung Ca and agrees to Screening CT nodule program.   Observations/Objective: CPAP auto 4-12/[facility]     Current range 10.5- 12>> today change to auto 4-10 Download compliance 100%, AHI 4.9/ hr CT chest 2019- stable lung nodule  Assessment and Plan:   Follow Up Instructions:

## 2020-08-10 NOTE — Telephone Encounter (Signed)
Sleep study showed average of 13 apneas/ hour, with drops in blood oxygen level.  I suggest we can continue CPAP auto 4-10. At next office visit we can again discuss alternatives if she wants.

## 2020-08-10 NOTE — Telephone Encounter (Signed)
Dr. Annamaria Boots please advise on sleep study results.  Thanks!

## 2020-08-10 NOTE — Telephone Encounter (Signed)
Called and spoke with patient to let her know sleep study results. Patient asked to schedule an appointment since her last OV was 02/03/2019.  Dr. Annamaria Boots can we use your slot on Monday 11/22 at  11:30?

## 2020-08-11 ENCOUNTER — Encounter: Payer: Self-pay | Admitting: Internal Medicine

## 2020-08-13 ENCOUNTER — Other Ambulatory Visit: Payer: Self-pay

## 2020-08-13 ENCOUNTER — Ambulatory Visit (INDEPENDENT_AMBULATORY_CARE_PROVIDER_SITE_OTHER): Payer: Medicare Other | Admitting: Internal Medicine

## 2020-08-13 ENCOUNTER — Encounter: Payer: Self-pay | Admitting: Internal Medicine

## 2020-08-13 VITALS — BP 126/62 | HR 77 | Temp 98.3°F | Ht 65.0 in | Wt 166.0 lb

## 2020-08-13 DIAGNOSIS — IMO0001 Reserved for inherently not codable concepts without codable children: Secondary | ICD-10-CM

## 2020-08-13 DIAGNOSIS — R911 Solitary pulmonary nodule: Secondary | ICD-10-CM

## 2020-08-13 DIAGNOSIS — I2584 Coronary atherosclerosis due to calcified coronary lesion: Secondary | ICD-10-CM | POA: Diagnosis not present

## 2020-08-13 DIAGNOSIS — I2510 Atherosclerotic heart disease of native coronary artery without angina pectoris: Secondary | ICD-10-CM | POA: Diagnosis not present

## 2020-08-13 DIAGNOSIS — G4733 Obstructive sleep apnea (adult) (pediatric): Secondary | ICD-10-CM | POA: Diagnosis not present

## 2020-08-13 NOTE — Patient Instructions (Signed)
Order- DME [facility]  please change auto range to 4-8, continue mask of choice, humidifier supplies, AirView/ card  Order- refer to sleep center for mask fitting/ desensitization  Order- refer to Orthodontist Dr Oneal Grout   Consider oral appliance for OSA

## 2020-08-14 ENCOUNTER — Telehealth: Payer: Self-pay | Admitting: Internal Medicine

## 2020-08-14 NOTE — Telephone Encounter (Signed)
Received message back from [facility] this has been taken care of Loretta Austin

## 2020-08-14 NOTE — Telephone Encounter (Signed)
Sent message to [facility] to have them take care of this the order was sent and received according to our records Loretta Austin

## 2020-08-22 ENCOUNTER — Ambulatory Visit (HOSPITAL_BASED_OUTPATIENT_CLINIC_OR_DEPARTMENT_OTHER): Payer: Medicare Other | Attending: Internal Medicine | Admitting: Internal Medicine

## 2020-08-22 ENCOUNTER — Other Ambulatory Visit: Payer: Self-pay

## 2020-08-22 DIAGNOSIS — G4733 Obstructive sleep apnea (adult) (pediatric): Secondary | ICD-10-CM

## 2020-08-27 NOTE — Telephone Encounter (Signed)
It appears the budesonide is needed long term.  Now we know.  My advice is to take the budesonide 9mg  daily x 2 weeks, then 6 mg daily x 2 weeks, then if diarrhea stopped, decrease to 3 mg once daily as before.  Please refill if needed.  Imodium can be used up to 2 tablets three times daily to help control diarrhea while budesonide gets working again.  Send a portal message to [place] in 3-4 weeks with an update or sooner if necessary.  - HD

## 2020-08-28 DIAGNOSIS — M859 Disorder of bone density and structure, unspecified: Secondary | ICD-10-CM | POA: Diagnosis not present

## 2020-08-28 DIAGNOSIS — I10 Essential (primary) hypertension: Secondary | ICD-10-CM | POA: Diagnosis not present

## 2020-09-04 DIAGNOSIS — I10 Essential (primary) hypertension: Secondary | ICD-10-CM | POA: Diagnosis not present

## 2020-09-04 DIAGNOSIS — E7800 Pure hypercholesterolemia, unspecified: Secondary | ICD-10-CM | POA: Diagnosis not present

## 2020-09-04 DIAGNOSIS — G4733 Obstructive sleep apnea (adult) (pediatric): Secondary | ICD-10-CM | POA: Diagnosis not present

## 2020-09-04 DIAGNOSIS — I700 Atherosclerosis of aorta: Secondary | ICD-10-CM | POA: Diagnosis not present

## 2020-09-04 DIAGNOSIS — R739 Hyperglycemia, unspecified: Secondary | ICD-10-CM | POA: Diagnosis not present

## 2020-09-04 DIAGNOSIS — M19049 Primary osteoarthritis, unspecified hand: Secondary | ICD-10-CM | POA: Diagnosis not present

## 2020-09-04 DIAGNOSIS — M069 Rheumatoid arthritis, unspecified: Secondary | ICD-10-CM | POA: Diagnosis not present

## 2020-09-04 DIAGNOSIS — R82998 Other abnormal findings in urine: Secondary | ICD-10-CM | POA: Diagnosis not present

## 2020-09-04 DIAGNOSIS — Z1212 Encounter for screening for malignant neoplasm of rectum: Secondary | ICD-10-CM | POA: Diagnosis not present

## 2020-09-04 DIAGNOSIS — Z0000 Encounter for general adult medical examination without abnormal findings: Secondary | ICD-10-CM | POA: Diagnosis not present

## 2020-09-05 DIAGNOSIS — Z1231 Encounter for screening mammogram for malignant neoplasm of breast: Secondary | ICD-10-CM | POA: Diagnosis not present

## 2020-09-22 ENCOUNTER — Encounter: Payer: Self-pay | Admitting: Internal Medicine

## 2020-09-22 NOTE — Assessment & Plan Note (Signed)
Discussed CT stability. Anticipate annual CXR.

## 2020-09-22 NOTE — Assessment & Plan Note (Addendum)
We are willing to sacrifice control for now to improve tolerance and compliance. Plan- education about goals, comfort and alternatives. Reduce range to auto 4-8. Refer to consider oral appliance as alternative.

## 2020-10-25 ENCOUNTER — Telehealth: Payer: Self-pay | Admitting: Gastroenterology

## 2020-10-25 DIAGNOSIS — M3500 Sicca syndrome, unspecified: Secondary | ICD-10-CM | POA: Diagnosis not present

## 2020-10-25 DIAGNOSIS — Z6828 Body mass index (BMI) 28.0-28.9, adult: Secondary | ICD-10-CM | POA: Diagnosis not present

## 2020-10-25 DIAGNOSIS — K52831 Collagenous colitis: Secondary | ICD-10-CM | POA: Diagnosis not present

## 2020-10-25 DIAGNOSIS — M0609 Rheumatoid arthritis without rheumatoid factor, multiple sites: Secondary | ICD-10-CM | POA: Diagnosis not present

## 2020-10-25 DIAGNOSIS — M150 Primary generalized (osteo)arthritis: Secondary | ICD-10-CM | POA: Diagnosis not present

## 2020-10-25 DIAGNOSIS — E663 Overweight: Secondary | ICD-10-CM | POA: Diagnosis not present

## 2020-10-25 NOTE — Telephone Encounter (Signed)
Inbound call from patient wanting to know why she is no longer able to take dexilant medication.

## 2020-10-25 NOTE — Telephone Encounter (Signed)
Noted, thank you

## 2020-10-25 NOTE — Telephone Encounter (Signed)
Lm on vm for patient to return call 

## 2020-10-25 NOTE — Telephone Encounter (Addendum)
Spoke with patient, she is wanting to know why she is no longer taking Dexilant, I read last office note, advised that Dr. Loletha Carrow recommended that she decrease to every other day and then to stop if she was not experiencing heartburn symptoms. Patient states that she never really had heartburn symptoms other than throat clearing, pt states that she sleeps on a permanently elevated bed, she again denied any heartburn symptoms, she states that she would like to be on Dexilant because her previous GI doctor told her she needed it for "polyps in her throat". Per patient message 07/30/20 patient was to stop Dexilant, she states she did stop it at that time and has not been on it. She states that she wonders if she needs another EGD. Advised patient that she should come in and discuss further with Dr. Loletha Carrow in regards to her concerns about Dexilant and wether she would need to restart although she denies any heartburn symptoms. Patient is scheduled for a follow up on Tuesday, 10/30/20 at 8:20 PM. Please advise on any further recommendations prior to appt. Thanks.

## 2020-10-25 NOTE — Telephone Encounter (Signed)
Yes, an office visit would be best to discuss this

## 2020-10-29 ENCOUNTER — Other Ambulatory Visit: Payer: Self-pay | Admitting: Gastroenterology

## 2020-10-30 ENCOUNTER — Encounter: Payer: Self-pay | Admitting: Gastroenterology

## 2020-10-30 ENCOUNTER — Other Ambulatory Visit: Payer: Self-pay

## 2020-10-30 ENCOUNTER — Ambulatory Visit (INDEPENDENT_AMBULATORY_CARE_PROVIDER_SITE_OTHER): Payer: Medicare Other | Admitting: Gastroenterology

## 2020-10-30 VITALS — BP 138/70 | HR 82 | Ht 65.0 in | Wt 170.0 lb

## 2020-10-30 DIAGNOSIS — K52831 Collagenous colitis: Secondary | ICD-10-CM | POA: Diagnosis not present

## 2020-10-30 DIAGNOSIS — R070 Pain in throat: Secondary | ICD-10-CM | POA: Diagnosis not present

## 2020-10-30 DIAGNOSIS — K529 Noninfective gastroenteritis and colitis, unspecified: Secondary | ICD-10-CM

## 2020-10-30 DIAGNOSIS — Z800 Family history of malignant neoplasm of digestive organs: Secondary | ICD-10-CM

## 2020-10-30 MED ORDER — BUDESONIDE 3 MG PO CPEP: 3.0000 mg | ORAL_CAPSULE | ORAL | 0 refills | Status: DC

## 2020-10-30 NOTE — Progress Notes (Signed)
[facility] Progress Note  Chief Complaint: Throat discomfort and collagenous colitis with diarrhea  Subjective  History: From my 07/02/2020 office note: "Last seen in telemedicine April 2020 for collagenous colitis under good control on low-dose budesonide, as well as reflux under control on daily Dexilant.   Loretta Austin's digestive symptoms have been stable since I last saw her.  She has 1 or 2 formed BMs every morning and denies rectal bleeding.  She does not get heartburn, but takes Dexilant daily.  It is been prescribed years ago before she moved to this area, and she figures it was probably for heartburn symptoms but really cannot recall for certain.  She does think it was prescribed after the collagenous colitis diagnosis.  She denies dysphagia, odynophagia, nausea or vomiting."  I recommended weaning off both the budesonide and Dexilant in the several weeks after that visit.  She called our office last week with phone note as follows: "Spoke with patient, she is wanting to know why she is no longer taking Dexilant, I read last office note, advised that Dr. Loletha Carrow recommended that she decrease to every other day and then to stop if she was not experiencing heartburn symptoms. Patient states that she never really had heartburn symptoms other than throat clearing, pt states that she sleeps on a permanently elevated bed, she again denied any heartburn symptoms, she states that she would like to be on Dexilant because her previous GI doctor told her she needed it for "polyps in her throat". Per patient message 07/30/20 patient was to stop Dexilant, she states she did stop it at that time and has not been on it. She states that she wonders if she needs another EGD."  ________________________________________________  Loretta Austin brought the report of an upper endoscopy from October 19, 2017 by Dr. Dionisio Paschal of [facility] physicians.  It is reportedly being done to follow-up descending duodenum  tubular adenoma (unclear when first removed, but the 2019 exam was the second post polypectomy surveillance endoscopy). Small gastric remnant patch proximal esophagus.  Esophagus slightly tortuous at 40 cm with mild distal esophagitis.  Mild diffuse gastric mucosal erythema and retained bile.  Scar in the descending duodenum with no reported residual polyp tissue.  Mild erythema of arytenoids. Endoscopic follow-up was not felt to be indicated (presumably regarding the duodenal adenoma).  Colon biopsies from May 2017 revealed "mild chronic inflammation, suggestive of microscopic colitis, increased subepithelial collagen)  Colonoscopy report at that time indicates patient had history of collagenous colitis that reportedly improved on Pepto-Bismol.  Complete exam to the terminal ileum.  Moderate left-sided diverticulosis.  No polyps. _________________________ Loretta Austin was frustrated by a perceived lack of communication and understanding of why her Dexilant was stopped.  In truth, she did not recall seeing me several months ago.   After having stopped the Dexilant, she still has throat clearing and does not get heartburn.  She confirms that heartburn had never been a regular symptom for her prior to the medicine being started by her previous GI physician.  After some initial confusion, I was able to determine that she stopped the budesonide after the last visit, but had recurrence of diarrhea and so went back on it briefly at 9 mg a day, then down to 6 mg and has been on 3 mg a day for about the last month.  With that she will have an occasional loose stool but mostly feels the diarrhea is under control.  She denies abdominal pain or  rectal bleeding.  ROS: Cardiovascular:  no chest pain Respiratory: no dyspnea  The patient's Past Medical, Family and Social History were reviewed and are on file in the EMR.  Objective:  Med list reviewed  Current Outpatient Medications:  .  AMBULATORY NON FORMULARY  MEDICATION, CBD tablets Take one tablet by mouth once daily, Disp: , Rfl:  .  amLODipine (NORVASC) 5 MG tablet, Take 5 mg by mouth daily., Disp: , Rfl:  .  Biotin 10000 MCG TABS, Take 1 tablet by mouth daily., Disp: , Rfl:  .  calcium citrate-vitamin D 500-500 MG-UNIT chewable tablet, Chew 1 tablet by mouth daily. , Disp: , Rfl:  .  Cyanocobalamin (VITAMIN B-12 PO), Take 2.5 mg by mouth daily., Disp: , Rfl:  .  folic acid (FOLVITE) 322 MCG tablet, Take 800 mcg by mouth daily., Disp: , Rfl:  .  HUMIRA 40 MG/0.4ML PSKT, Inject 1 Syringe as directed every 14 (fourteen) days., Disp: , Rfl:  .  ipratropium (ATROVENT) 0.03 % nasal spray, Place 2 sprays into both nostrils as needed. , Disp: , Rfl:  .  loratadine (CLARITIN) 10 MG tablet, Take 10 mg by mouth in the morning and at bedtime. , Disp: , Rfl:  .  losartan (COZAAR) 100 MG tablet, Take 100 mg by mouth daily., Disp: , Rfl:  .  Magnesium 400 MG TABS, Take 1 tablet by mouth daily in the afternoon., Disp: , Rfl:  .  Melatonin 10 MG TABS, Take 10 mg by mouth daily., Disp: , Rfl:  .  Multiple Vitamins-Iron (MULTIVITAMIN/IRON PO), Take 1 tablet by mouth daily., Disp: , Rfl:  .  nitrofurantoin (MACRODANTIN) 100 MG capsule, Take 100 mg by mouth at bedtime., Disp: , Rfl:  .  polycarbophil (FIBERCON) 625 MG tablet, Take 625 mg by mouth daily., Disp: , Rfl:  .  Probiotic Product (PROBIOTIC DAILY PO), Take 1 capsule by mouth daily. Generic Align, Disp: , Rfl:  .  RESTASIS 0.05 % ophthalmic emulsion, Place 1 drop into both eyes 2 (two) times daily. , Disp: , Rfl:  .  rosuvastatin (CRESTOR) 10 MG tablet, Take 1 tablet (10 mg total) by mouth daily., Disp: 90 tablet, Rfl: 3 .  zaleplon (SONATA) 5 MG capsule, Take 5 mg by mouth at bedtime as needed for sleep., Disp: , Rfl:  .  budesonide (ENTOCORT EC) 3 MG 24 hr capsule, Take 1 capsule (3 mg total) by mouth as directed., Disp: 90 capsule, Rfl: 0   Vital signs in last 24 hrs: Vitals:   10/30/20 0818  BP:  138/70  Pulse: 82   Wt Readings from Last 3 Encounters:  10/30/20 170 lb (77.1 kg)  08/13/20 166 lb (75.3 kg)  07/02/20 164 lb 2 oz (74.4 kg)    Physical Exam  Normal vocal quality, occasional throat clearing  No additional exam, entire visit spent in discussion. Labs:   ___________________________________________ Radiologic studies:  Records as above ____________________________________________ Other:   _____________________________________________ Assessment & Plan  Assessment: Encounter Diagnoses  Name Primary?  . Chronic diarrhea Yes  . Collagenous colitis   . Throat discomfort   . Family history of colon cancer in mother    History of collagenous colitis.  We discussed the unknown nature of this condition but the possible NSAID and PPI triggers.  This was part of my motivation in having her off the [place], but also because it was not clear that she still needed it for the reported symptoms.  She recalls there being some previous  endoscopic findings suggesting reflux and being told she should always be on this medicine and keep her head of bed elevated.  If there were such findings on a prior EGD, there is no report of it with these records.  Certainly on the 4888 exam there was nothing suggesting severe reflux, and having stopped the medicine her throat clearing has not improved.  I suspect it is allergic/environmental and not reflux related.  We also again discussed the possible risks of PPIs.   Plan: Colonoscopy due May of this year for family history of colon cancer.  If she develops reflux symptoms or heartburn and regurgitation, she will let me know so we can decide if upper endoscopy may be necessary at that time as well.  Her previous EGD indicated no further endoscopic follow-up for the duodenal adenoma was necessary.  Regarding the budesonide, after further discussion we agreed she would continue it at 3 mg daily for another 2 months, then decrease to every  other day for 2 weeks and then stop.  That should bring her to about the time of her colonoscopy with me. I clarified that her mother was in her early to mid 36s at the time of colon cancer diagnosis.  35 minutes were spent on this encounter (including chart review, history/exam, counseling/coordination of care, and documentation), 25 minutes of which was spent on counseling and coordination of care.  Topics discussed included: PPI, collagenous colitis, budesonide, colon cancer screening guidelines related to family history  Loretta Austin

## 2020-10-30 NOTE — Patient Instructions (Signed)
If you are age 74 or older, your body mass index should be between 23-30. Your Body mass index is 28.29 kg/m. If this is out of the aforementioned range listed, please consider follow up with your Primary Care Provider.  If you are age 29 or younger, your body mass index should be between 19-25. Your Body mass index is 28.29 kg/m. If this is out of the aformentioned range listed, please consider follow up with your Primary Care Provider.   Please continue the budesonide 3 mg once a day for the next eight weeks, then decrease to one budesonide 3 mg for every other day for two weeks then stop.  You will be due for a recall colonoscopy in July 2022. We will send you a reminder in the mail when it gets closer to that time.  It was a pleasure to see you today!  Dr. Loletha Carrow

## 2020-11-22 ENCOUNTER — Encounter: Payer: Self-pay | Admitting: Cardiovascular Disease

## 2020-11-22 NOTE — Progress Notes (Signed)
Cardiology Office Note:    Date:  11/23/2020   ID:  Loretta Austin, DOB [birth year], MRN [id number]  PCP:  Leanna Battles, MD  [facility] Cardiologist:  Charise Leinbach  [facility] Electrophysiologist:  None   Referring MD: [name] [facility]*   Chief Complaint  Patient presents with  . Hypertension     History of Present Illness:    Loretta Austin is a 75 y.o. female with a hx of HTN.  We were asked to see her by Dr. Keturah Austin for further evaluation of her coronary artery calcifications.  Has a strong family hx  Father has CAD Brother has stents Mother has AAA .  She is moderately active.   Water aerobic 3 times a day Walks her dog but no real exercise  No angina .  No syncope , no presyncope   Labs from [place] shows Chol = 239 HDL = 67 LDL = 148 Trigs = 119   Has been on Losartan + HCTZ but was found to be very hyponatremic ( was admitted with food poisoning)   Also had acute / chronic kidney disease -  Cre = 2.23 in the setting of food poisoning   Creatinine has normalized now   BP is borderline elevated  She admits to eating more salt   November 23, 2020: Jacqlyn Austin is seen today for a follow-up of her hypertension and coronary artery calcifications.  She has a very strong family history of coronary artery disease.  She has not had any symptoms of angina. Coronary calcium score of 518 Agatston units. This was 89th percentile for age and sex matched control.  Is not having any coronary symptoms  - no angina  Her creatinine has improved  - is now 0.82. We have not done a stress test.  She does not do much exercise  -  [facility]   Has some DOE if she does too much exercise   Past Medical History:  Diagnosis Date  . Arthritis   . Cholecystolithiasis   . Colon polyps   . Diverticulosis   . Food poisoning   . GERD (gastroesophageal reflux disease)   . High blood pressure   . Microscopic colitis   . Sjogren's  disease ([place])   . Sleep apnea     Past Surgical History:  Procedure Laterality Date  . TONSILLECTOMY AND ADENOIDECTOMY    . TUBAL LIGATION      Current Medications: Current Meds  Medication Sig  . AMBULATORY NON FORMULARY MEDICATION CBD tablets Take one tablet by mouth once daily  . amLODipine (NORVASC) 5 MG tablet Take 5 mg by mouth daily.  . Biotin 10000 MCG TABS Take 1 tablet by mouth daily.  . budesonide (ENTOCORT EC) 3 MG 24 hr capsule Take 3 mg by mouth daily.  . calcium citrate-vitamin D 500-500 MG-UNIT chewable tablet Chew 1 tablet by mouth daily.   . Cyanocobalamin (VITAMIN B-12 PO) Take 2.5 mg by mouth daily.  . folic acid (FOLVITE) 009 MCG tablet Take 800 mcg by mouth daily.  Marland Kitchen HUMIRA 40 MG/0.4ML PSKT Inject 1 Syringe as directed every 14 (fourteen) days.  Marland Kitchen ipratropium (ATROVENT) 0.03 % nasal spray Place 2 sprays into both nostrils as needed.   . loratadine (CLARITIN) 10 MG tablet Take 10 mg by mouth in the morning and at bedtime.   Marland Kitchen losartan (COZAAR) 100 MG tablet Take 100 mg by mouth daily.  . Magnesium 400 MG TABS  Take 1 tablet by mouth daily in the afternoon.  . Melatonin 10 MG TABS Take 10 mg by mouth daily.  . Multiple Vitamins-Iron (MULTIVITAMIN/IRON PO) Take 1 tablet by mouth daily.  . nitrofurantoin (MACRODANTIN) 100 MG capsule Take 100 mg by mouth at bedtime.  . polycarbophil (FIBERCON) 625 MG tablet Take 625 mg by mouth daily.  . Probiotic Product (PROBIOTIC DAILY PO) Take 1 capsule by mouth daily. Generic Align  . RESTASIS 0.05 % ophthalmic emulsion Place 1 drop into both eyes 2 (two) times daily.   . rosuvastatin (CRESTOR) 10 MG tablet Take 1 tablet (10 mg total) by mouth daily.  . zaleplon (SONATA) 5 MG capsule Take 5 mg by mouth at bedtime as needed for sleep.     Allergies:   Ciprofloxacin and Flagyl [metronidazole]   Social History   Socioeconomic History  . Marital status: Widowed    Spouse name: Not on file  . Number of children: 1  . Years  of education: Not on file  . Highest education level: Not on file  Occupational History  . Occupation: retired  Tobacco Use  . Smoking status: Former Smoker    Types: Cigarettes  . Smokeless tobacco: Never Used  Vaping Use  . Vaping Use: Never used  Substance and Sexual Activity  . Alcohol use: Yes    Comment: occasional  . Drug use: Never  . Sexual activity: Not Currently    Partners: Male  Other Topics Concern  . Not on file  Social History Narrative  . Not on file   Social Determinants of Health   Financial Resource Strain: Not on file  Food Insecurity: Not on file  Transportation Needs: Not on file  Physical Activity: Not on file  Stress: Not on file  Social Connections: Not on file     Family History: The patient's family history includes Colon cancer in her mother; Lung cancer in her mother.  ROS:   Please see the history of present illness.     All other systems reviewed and are negative.  EKGs/Labs/Other Studies Reviewed:    The following studies were reviewed today:     Recent Labs: 03/12/2020: ALT 47; BUN 40; Creatinine, Ser 2.23; Hemoglobin 10.7; Magnesium 2.1; Platelets 251; Potassium 3.5; Sodium 135  Recent Lipid Panel No results found for: CHOL, TRIG, HDL, CHOLHDL, VLDL, LDLCALC, LDLDIRECT  Physical Exam:    Physical Exam: Blood pressure 126/64, pulse 68, height 5\' 5"  (1.651 m), weight 168 lb 12.8 oz (76.6 kg), SpO2 98 %.  GEN:  Well nourished, well developed in no acute distress HEENT: Normal NECK: No JVD; No carotid bruits LYMPHATICS: No lymphadenopathy CARDIAC: RRR , no murmurs, rubs, gallops RESPIRATORY:  Clear to auscultation without rales, wheezing or rhonchi  ABDOMEN: Soft, non-tender, non-distended MUSCULOSKELETAL:  No edema; No deformity  SKIN: Warm and dry NEUROLOGIC:  Alert and oriented x 3   EKG:        ASSESSMENT:    1. DOE (dyspnea on exertion)    PLAN:       Coronary artery calcifications:    Coronary calcium  score of 518 Agatston units. This was 89th percentile for age and sex matched control. She has some DOE if she overdoes it.  No CP or tightness. I would like to get a Dionicia Abler for further evaluation   2.  Hypertension:   BP is well controlled.    Medication Adjustments/Labs and Tests Ordered: Current medicines are reviewed at length with the patient  today.  Concerns regarding medicines are outlined above.  Orders Placed This Encounter  Procedures  . Cardiac Stress Test: Informed Consent Details: Physician/Practitioner Attestation; Transcribe to consent form and obtain patient signature  . MYOCARDIAL PERFUSION IMAGING   No orders of the defined types were placed in this encounter.    Patient Instructions   Medication Instructions:  Your physician recommends that you continue on your current medications as directed. Please refer to the Current Medication list given to you today.  *If you need a refill on your cardiac medications before your next appointment, please call your pharmacy*   Lab Work: none If you have labs (blood work) drawn today and your tests are completely normal, you will receive your results only by: Marland Kitchen MyChart Message (if you have MyChart) OR . A paper copy in the mail If you have any lab test that is abnormal or we need to change your treatment, we will call you to review the results.   Testing/Procedures: Your physician has requested that you have a lexiscan myoview. For further information please visit [email or web address]. Please follow instruction sheet, as given.   Follow-Up: At [facility], you and your health needs are our priority.  As part of our continuing mission to provide you with exceptional heart care, we have created designated Provider Care Teams.  These Care Teams include your primary Cardiologist (physician) and Advanced Practice Providers (APPs -  Physician Assistants and Nurse Practitioners) who all work together to provide you  with the care you need, when you need it.   Your next appointment:   1 year(s)  The format for your next appointment:   In Person  Provider:   You may see Dr.Alexsandro Salek or one of the following Advanced Practice Providers on your designated Care Team:    Richardson Dopp, PA-C  [place], [place]                            Betadine solution      Signed, Mertie Moores, MD  11/23/2020 5:17 PM    [place]

## 2020-11-23 ENCOUNTER — Encounter: Payer: Self-pay | Admitting: Cardiovascular Disease

## 2020-11-23 ENCOUNTER — Other Ambulatory Visit: Payer: Self-pay

## 2020-11-23 ENCOUNTER — Ambulatory Visit (INDEPENDENT_AMBULATORY_CARE_PROVIDER_SITE_OTHER): Payer: Medicare Other | Admitting: Cardiovascular Disease

## 2020-11-23 VITALS — BP 126/64 | HR 68 | Ht 65.0 in | Wt 168.8 lb

## 2020-11-23 DIAGNOSIS — R0609 Other forms of dyspnea: Secondary | ICD-10-CM

## 2020-11-23 DIAGNOSIS — R0600 Dyspnea, unspecified: Secondary | ICD-10-CM

## 2020-11-23 NOTE — Patient Instructions (Addendum)
  Medication Instructions:  Your physician recommends that you continue on your current medications as directed. Please refer to the Current Medication list given to you today.  *If you need a refill on your cardiac medications before your next appointment, please call your pharmacy*   Lab Work: none If you have labs (blood work) drawn today and your tests are completely normal, you will receive your results only by: Marland Kitchen MyChart Message (if you have MyChart) OR . A paper copy in the mail If you have any lab test that is abnormal or we need to change your treatment, we will call you to review the results.   Testing/Procedures: Your physician has requested that you have a lexiscan myoview. For further information please visit [email or web address]. Please follow instruction sheet, as given.   Follow-Up: At [facility], you and your health needs are our priority.  As part of our continuing mission to provide you with exceptional heart care, we have created designated Provider Care Teams.  These Care Teams include your primary Cardiologist (physician) and Advanced Practice Providers (APPs -  Physician Assistants and Nurse Practitioners) who all work together to provide you with the care you need, when you need it.   Your next appointment:   1 year(s)  The format for your next appointment:   In Person  Provider:   You may see Dr.Zyaire Dumas or one of the following Advanced Practice Providers on your designated Care Team:    Richardson Dopp, PA-C  [place], [place]                            Betadine solution

## 2020-11-26 ENCOUNTER — Other Ambulatory Visit: Payer: Self-pay

## 2020-11-26 MED ORDER — BUDESONIDE 3 MG PO CPEP: ORAL_CAPSULE | ORAL | 2 refills | Status: DC

## 2020-11-28 ENCOUNTER — Telehealth (HOSPITAL_COMMUNITY): Payer: Self-pay | Admitting: *Deleted

## 2020-11-28 NOTE — Telephone Encounter (Signed)
Patient given detailed instructions per Myocardial Perfusion Study Information Sheet for the test on 12/05/20 at 0745. Patient notified to arrive 15 minutes early and that it is imperative to arrive on time for appointment to keep from having the test rescheduled.  If you need to cancel or reschedule your appointment, please call the office within 24 hours of your appointment. . Patient verbalized understanding.Loretta Austin, Loretta Austin

## 2020-11-29 DIAGNOSIS — L720 Epidermal cyst: Secondary | ICD-10-CM | POA: Diagnosis not present

## 2020-11-29 DIAGNOSIS — L570 Actinic keratosis: Secondary | ICD-10-CM | POA: Diagnosis not present

## 2020-11-29 DIAGNOSIS — L814 Other melanin hyperpigmentation: Secondary | ICD-10-CM | POA: Diagnosis not present

## 2020-11-29 DIAGNOSIS — D692 Other nonthrombocytopenic purpura: Secondary | ICD-10-CM | POA: Diagnosis not present

## 2020-11-29 DIAGNOSIS — L578 Other skin changes due to chronic exposure to nonionizing radiation: Secondary | ICD-10-CM | POA: Diagnosis not present

## 2020-11-29 DIAGNOSIS — L821 Other seborrheic keratosis: Secondary | ICD-10-CM | POA: Diagnosis not present

## 2020-12-05 ENCOUNTER — Ambulatory Visit (HOSPITAL_COMMUNITY): Payer: Medicare Other | Attending: Cardiology

## 2020-12-05 ENCOUNTER — Other Ambulatory Visit: Payer: Self-pay

## 2020-12-05 DIAGNOSIS — R0609 Other forms of dyspnea: Secondary | ICD-10-CM

## 2020-12-05 DIAGNOSIS — R0600 Dyspnea, unspecified: Secondary | ICD-10-CM | POA: Insufficient documentation

## 2020-12-05 LAB — MYOCARDIAL PERFUSION IMAGING
LV dias vol: 83 mL (ref 46–106)
LV sys vol: 23 mL
Peak HR: 105 {beats}/min
Rest HR: 57 {beats}/min
SDS: 1
SRS: 0
SSS: 1
TID: 0.95

## 2020-12-05 MED ORDER — TECHNETIUM TC 99M TETROFOSMIN IV KIT
10.2000 | PACK | Freq: Once | INTRAVENOUS | Status: AC | PRN
  Administered 2020-12-05: 10.2 via INTRAVENOUS
  Filled 2020-12-05: qty 11

## 2020-12-05 MED ORDER — REGADENOSON 0.4 MG/5ML IV SOLN
0.4000 mg | Freq: Once | INTRAVENOUS | Status: AC
  Administered 2020-12-05: 0.4 mg via INTRAVENOUS

## 2020-12-05 MED ORDER — TECHNETIUM TC 99M TETROFOSMIN IV KIT
30.9000 | PACK | Freq: Once | INTRAVENOUS | Status: AC | PRN
  Administered 2020-12-05: 30.9 via INTRAVENOUS
  Filled 2020-12-05: qty 31

## 2020-12-12 NOTE — Progress Notes (Signed)
HPI female widowed smoker followed for OSA, lung nodule, cough, complicated by arthritis, GERD, HBP, scleroderma, Sjogren's disease, allergic rhinitis,  Insomnia NPSG 11/ 2013- AHI 6.4/ hr, BMI 33.4 HST 08/02/20- AHI 13.5/ hr, desaturation to 81%, body weight 164 lbs --------------------------------------------------------------------------------------   08/13/20-75 year old female widowed former smoker followed for OSA, lung nodule, cough, complicated by Rheumatoid arthritis/ Humira, GERD, HBP, scleroderma, Sjogren's disease, allergic rhinitis,  Insomnia, HTN, She had reported losing 50 lbs, wanting to d/c CPAP, so we ordered an updated HST. HST 08/02/20- AHI 13.5/ hr, desaturation to 81%, body weight 164 lbs CPAP auto 8-12/[facility]>> today 4-8 Download- compliance 3%, AHI 4/ hr Body weight today- 166 lbs Covid vax-3 Phizer Flu vax- had Hosp in June for hyponatremia, E.coli bacteremia, CKD,  -----pt is here cpap compliance She says pressure too high (ranges between 11.5 and 12, so it should probably be set for higher maximum).  Says it aggravates her GERD. This machine is only a few months old.  Discussed CT, with stable small nodules.  CT chest 04/18/20-  IMPRESSION: 1. Stable tiny bilateral pulmonary nodules in the 1 year interval since prior exam. Interval stability compatible with benign etiology. Consensus criteria suggest no further imaging follow-up warranted. This recommendation follows the consensus statement: Guidelines for Management of Incidental Pulmonary Nodules Detected on CT Images: From the Fleischner Society 2017; Radiology 2017; 284:228-243. 2. Cholelithiasis. 3. Aortic Atherosclerosis (ICD10-I70.0).  12/13/20- 76 year old female widowed former smoker followed for OSA/ Oral appliance, lung nodule, cough, complicated by Rheumatoid arthritis/ Humira, GERD, HBP, scleroderma, Sjogren's disease, allergic rhinitis,  Insomnia, HTN, She had reported losing 50 lbs, wanting to  d/c CPAP, so we ordered an updated HST. HST 08/02/20- AHI 13.5/ hr, desaturation to 81%, body weight 164 lbs CPAP auto 4-8/ [facility]     Didn't tolerate higher, saying it irritated her GERD>>  changed to oral appliance Download-compliance 73%, AHI 11.2/ hr>> oral appliance Dr Ron Parker Body weight today- 170 lbs Covid vax-3 Phizer Flu vax-had ------Uses oral appliance, doing well Breathing ok- no concerns. Benign CT discussed last visit. With known scleroderma, consider CXR at least every few years.    ROS-see HPI   + = positive Constitutional:    weight loss, night sweats, fevers, chills, fatigue, lassitude. HEENT:    headaches, difficulty swallowing, tooth/dental problems, sore throat,       sneezing, itching, ear ache, +nasal congestion, post nasal drip, snoring CV:    chest pain, orthopnea, PND, swelling in lower extremities, anasarca,                                   dizziness, palpitations Resp:   shortness of breath with exertion or at rest.                productive cough,  + non-productive cough, coughing up of blood.              change in color of mucus.  wheezing.   Skin:    rash or lesions. GI:  + heartburn, indigestion, abdominal pain, nausea, vomiting, diarrhea,                 change in bowel habits, loss of appetite GU: dysuria, change in color of urine, no urgency or frequency.   flank pain. MS:   joint pain, stiffness, decreased range of motion, back pain. Neuro-     nothing unusual Psych:  change in mood or affect.  depression or anxiety.   memory loss.  OBJ- Physical Exam General- Alert, Oriented, Affect-appropriate, Distress- none acute + overweight Skin- rash-none, lesions- none, excoriation- none Lymphadenopathy- none Head- atraumatic            Eyes- Gross vision intact, PERRLA, conjunctivae and secretions clear            Ears- Hearing, canals-normal            Nose-sign turbinate edema, no-Septal dev, mucus, polyps, erosion, perforation             Throat-  Mallampati III-IV , mucosa clear , drainage- none, tonsils- absent + throat clearing Neck- flexible , trachea midline, no stridor , thyroid nl, carotid no bruit Chest - symmetrical excursion , unlabored           Heart/CV- RRR , no murmur , no gallop  , no rub, nl s1 s2                           - JVD- none , edema- none, stasis changes- none, varices- none           Lung- clear to P&A, wheeze- none, cough- none , dullness-none, rub- none           Chest wall-  Abd-  Br/ Gen/ Rectal- Not done, not indicated Extrem- cyanosis- none, clubbing, none, atrophy- none, strength- nl Neuro- grossly intact to observation

## 2020-12-13 ENCOUNTER — Other Ambulatory Visit: Payer: Self-pay

## 2020-12-13 ENCOUNTER — Ambulatory Visit (INDEPENDENT_AMBULATORY_CARE_PROVIDER_SITE_OTHER): Payer: Medicare Other | Admitting: Internal Medicine

## 2020-12-13 ENCOUNTER — Encounter: Payer: Self-pay | Admitting: Internal Medicine

## 2020-12-13 VITALS — BP 118/74 | HR 67 | Temp 98.0°F | Ht 66.0 in | Wt 170.0 lb

## 2020-12-13 DIAGNOSIS — G4733 Obstructive sleep apnea (adult) (pediatric): Secondary | ICD-10-CM

## 2020-12-13 DIAGNOSIS — M069 Rheumatoid arthritis, unspecified: Secondary | ICD-10-CM

## 2020-12-13 NOTE — Patient Instructions (Signed)
I'm really pleased that the oral appliance is working well for you.  Order- DME [facility]- please D/C CPAP  Please call if we can help

## 2020-12-19 DIAGNOSIS — Z23 Encounter for immunization: Secondary | ICD-10-CM | POA: Diagnosis not present

## 2021-03-30 ENCOUNTER — Encounter: Payer: Self-pay | Admitting: Internal Medicine

## 2021-03-30 NOTE — Assessment & Plan Note (Signed)
Now trying oral appliance from Dr Ron Parker and reports optimistic it will do well for her. Plan- dc CPAP

## 2021-03-30 NOTE — Assessment & Plan Note (Signed)
Some potential to develop ILD Plan- occasional CXR> CT when appropriate

## 2021-04-08 ENCOUNTER — Other Ambulatory Visit: Payer: Self-pay

## 2021-04-08 NOTE — Telephone Encounter (Signed)
Spoke with patient to schedule her recall colonoscopy. Patient has been scheduled for a pre-visit with the nurse on Monday, 04/15/21 at 1 PM. Colonoscopy is scheduled for Friday, 04/19/21 at 10:30 am, arriving at 9:30 am with a care partner. Patient verbalized understanding of all information and had no concerns at the end of the call.

## 2021-04-08 NOTE — Telephone Encounter (Signed)
Please arrange an [place] pre-visit for this patient to plan colonoscopy for family history of colon cancer.  - HD

## 2021-04-12 IMAGING — CT CT CARDIAC CORONARY ARTERY CALCIUM SCORE
3 series · 14 of 20 positions shown, 15 images · non-contrast
Comparison: Chest CT 04/18/2020
COMPARISON: Chest CT 04/18/2020

Addendum:
EXAM:
OVER-READ INTERPRETATION  CT CHEST

The following report is an over-read performed by radiologist Dr.
Abdirashid Santillan [REDACTED] on 06/04/2020. This over-read
does not include interpretation of cardiac or coronary anatomy or
pathology. The calcium score interpretation by the cardiologist is
attached.
CLINICAL DATA: Risk stratification
Coronary Calcium Score
TECHNIQUE: The patient was scanned on a Siemens Sensation 16 slice scanner.
Axial non-contrast 3mm slices were carried out through the heart.
The data set was analyzed on a dedicated work station and scored
using the Agatson method.

[Series 2: casc 3.0 bv41 2 bestdiast 69 % · axial · 0.34mm/px · z∈[-255,-168]mm · 4 of 49 slices shown, 5 images]
[im 10/49  vessel]
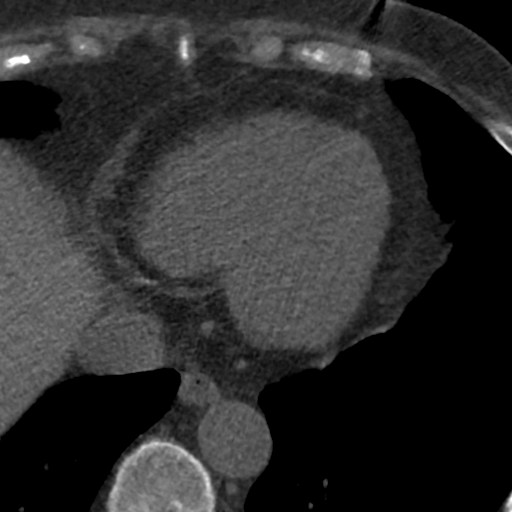
[im 10/49  lung]
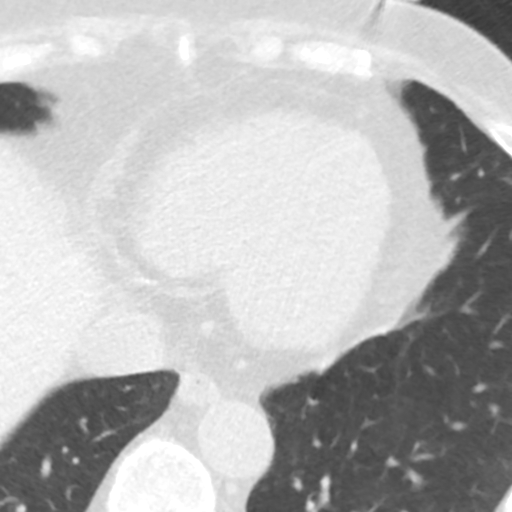
[im 20/49  vessel]
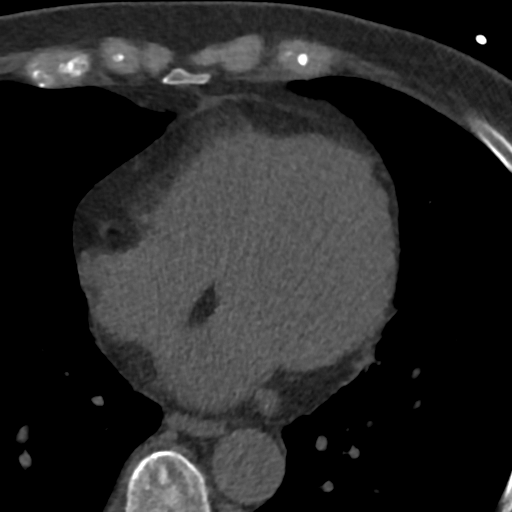
[im 29/49  vessel]
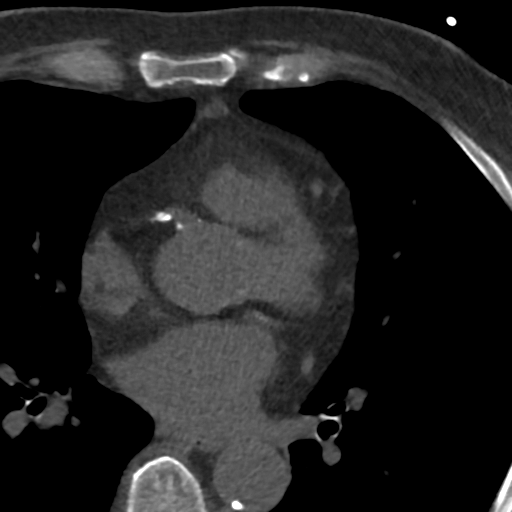
[im 39/49  vessel]
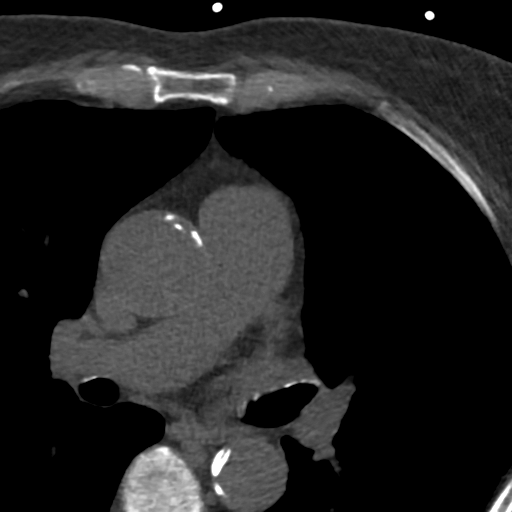

[Series 3: lung 71 % · axial · 0.70mm/px · z∈[-258,-162]mm · 5 of 49 slices shown]
[im 9/49  lung]
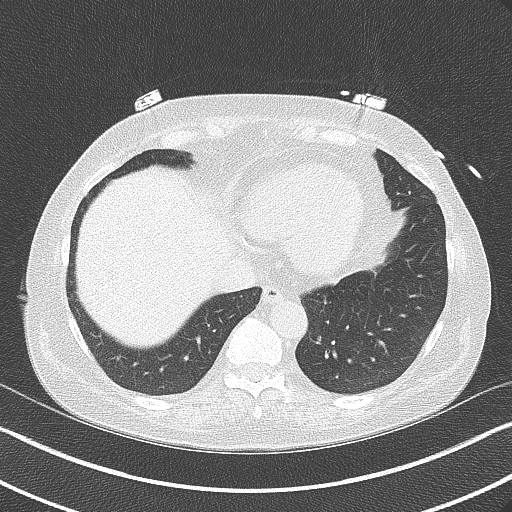
[im 17/49  lung]
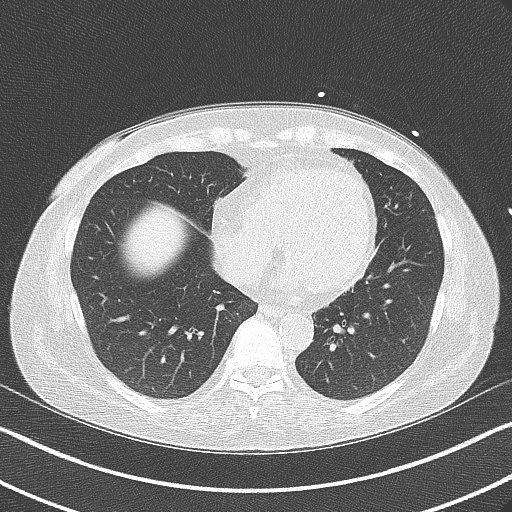
[im 25/49  lung]
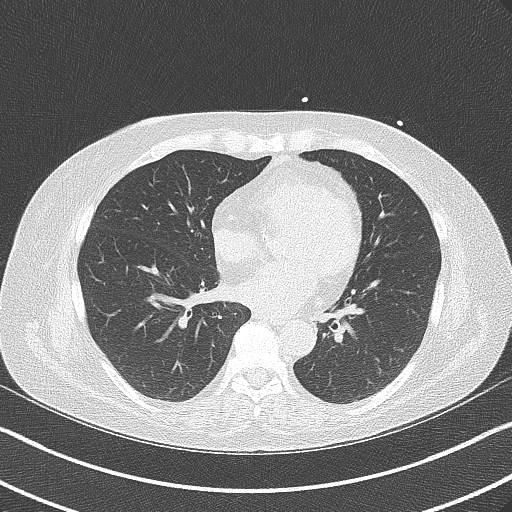
[im 33/49  lung]
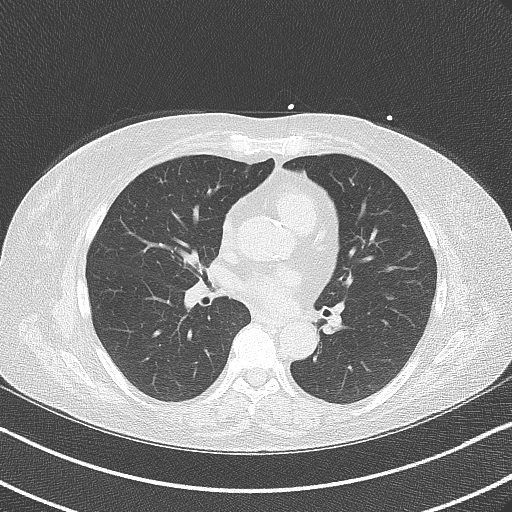
[im 41/49  lung]
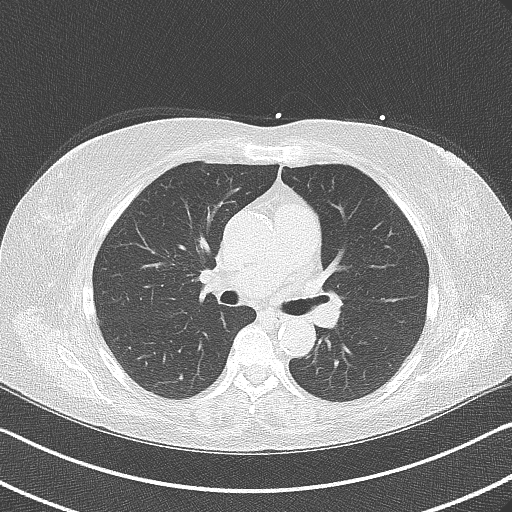

[Series 4: lung st 71 % · axial · 0.70mm/px · z∈[-258,-162]mm · 5 of 49 slices shown]
[im 9/49  lung]
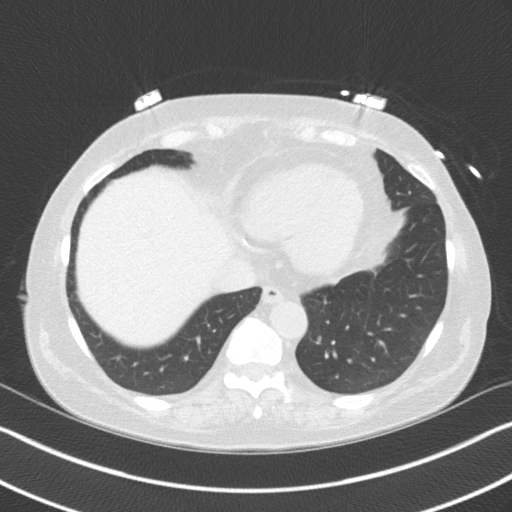
[im 17/49  lung]
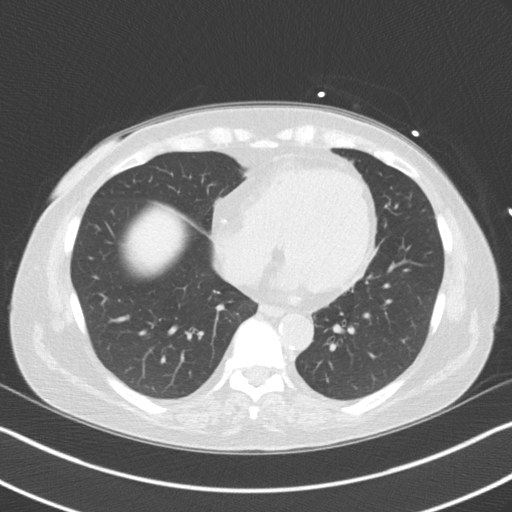
[im 25/49  lung]
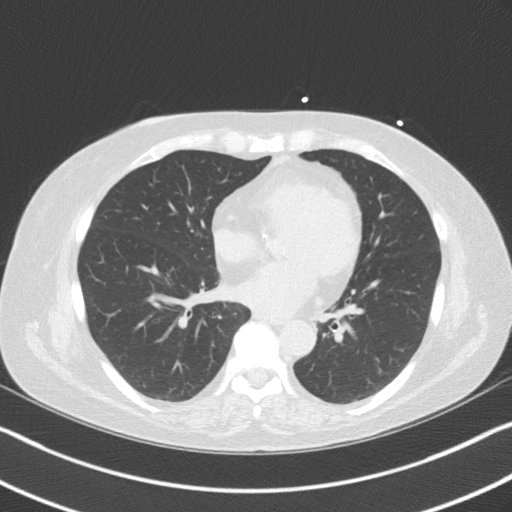
[im 33/49  lung]
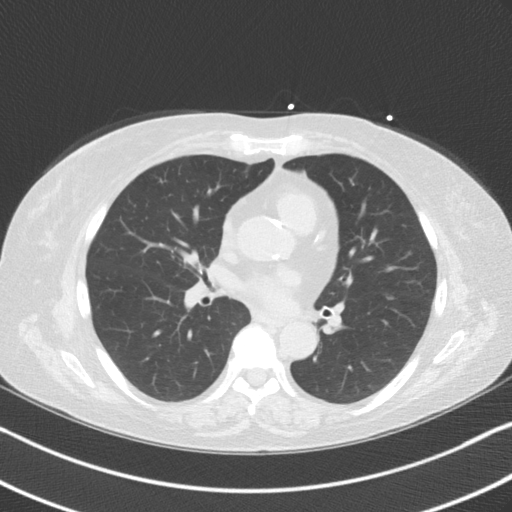
[im 41/49  lung]
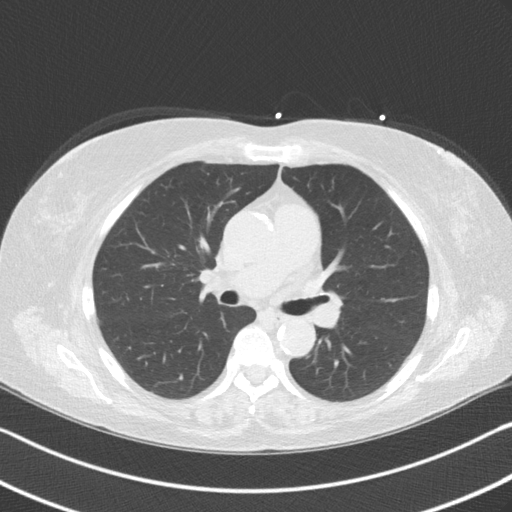

[14 of 20 positions shown; findings below may reference images not displayed]

FINDINGS: Vascular: Advanced aortic atherosclerosis.

Mediastinum/Nodes: No imaged thoracic adenopathy.

Lungs/Pleura: No pleural fluid. Bilateral pulmonary nodules were
detailed on the prior dedicated chest CT. No lobar consolidation.

Upper Abdomen: Normal imaged portions of the liver, spleen, stomach

Musculoskeletal: No acute osseous abnormality.
IMPRESSION: No acute findings in the imaged extracardiac chest.

Aortic Atherosclerosis (GHFM9-EB1.1).
FINDINGS: Non-cardiac: See separate report from [REDACTED].

Ascending Aorta: Normal caliber. Calcification noted in the
ascending thoracic aorta.

Pericardium: Normal

Coronary arteries: There appears to be an anomalous left circumflex
with origin on the proximal RCA and coursing posterior to the aorta
to reach typical circumflex territory.
IMPRESSION: 1. Coronary calcium score of 518 Agatston units. This was 89th
percentile for age and sex matched control.

2. Suspect anomalous left circumflex coronary off the proximal RCA
(see above).

Nhial Kreem

*** End of Addendum ***
EXAM:
OVER-READ INTERPRETATION  CT CHEST

The following report is an over-read performed by radiologist Dr.
Abdirashid Santillan [REDACTED] on 06/04/2020. This over-read
does not include interpretation of cardiac or coronary anatomy or
pathology. The calcium score interpretation by the cardiologist is
attached.
FINDINGS: Vascular: Advanced aortic atherosclerosis.

Mediastinum/Nodes: No imaged thoracic adenopathy.

Lungs/Pleura: No pleural fluid. Bilateral pulmonary nodules were
detailed on the prior dedicated chest CT. No lobar consolidation.

Upper Abdomen: Normal imaged portions of the liver, spleen, stomach

Musculoskeletal: No acute osseous abnormality.
IMPRESSION: No acute findings in the imaged extracardiac chest.

Aortic Atherosclerosis (GHFM9-EB1.1).

## 2021-04-15 ENCOUNTER — Encounter: Payer: Self-pay | Admitting: *Deleted

## 2021-04-15 ENCOUNTER — Ambulatory Visit (AMBULATORY_SURGERY_CENTER): Payer: Self-pay | Admitting: *Deleted

## 2021-04-15 ENCOUNTER — Other Ambulatory Visit: Payer: Self-pay

## 2021-04-15 VITALS — Ht 66.0 in | Wt 177.0 lb

## 2021-04-15 DIAGNOSIS — Z800 Family history of malignant neoplasm of digestive organs: Secondary | ICD-10-CM

## 2021-04-15 MED ORDER — PLENVU 140 G PO SOLR: 1.0000 | Freq: Once | ORAL | 0 refills | Status: AC

## 2021-04-15 NOTE — Progress Notes (Signed)
Patient is here in-person for PV. Patient denies any allergies to eggs or soy. Patient denies any problems with anesthesia/sedation. Patient's grandfather had malignant hyperthermia and passes away during surgery per pt. Patient denies any oxygen use at home. Patient denies taking any diet/weight loss medications or blood thinners. Patient is aware of our care-partner policy and 0000000 safety protocol. EMMI education assigned to the patient for the procedure, sent to [place].   Patient is COVID-19 vaccinated, per patient.   Prep Prescription coupon given to the patient.

## 2021-04-15 NOTE — Progress Notes (Signed)
Osvaldo Angst, CRNA  Levonne Spiller, RN Miliano Cotten,   This pt is cleared for anesthetic care at [facility].   Thanks,   Osvaldo Angst         Previous Messages    ----- Message -----  From: Levonne Spiller, RN  Sent: 04/15/2021   1:31 PM EDT  To: Osvaldo Angst, CRNA   Hey. This patient states her grandfather had malignant hyperthermia and died during gallbladder surgery years ago. She has had previous surgeries and colonoscopy without any difficultly. Okay for [place] colonoscopy? Please advise. Thank you. Nigil Braman pv

## 2021-04-16 ENCOUNTER — Telehealth: Payer: Self-pay | Admitting: *Deleted

## 2021-04-16 NOTE — Telephone Encounter (Signed)
Loretta Austin, CMA  P [facility] Pre-Visit Fyi - [facility] indicating preferred is suprep or sutab   Received above as a staff message.   I called [facility] pharmacy and gave them the universal [employer] Medicare coupon information.  Pharmacist was able to run coupon= price is $60.00

## 2021-04-19 ENCOUNTER — Ambulatory Visit (AMBULATORY_SURGERY_CENTER): Payer: Medicare Other | Admitting: Gastroenterology

## 2021-04-19 ENCOUNTER — Other Ambulatory Visit: Payer: Self-pay

## 2021-04-19 ENCOUNTER — Encounter: Payer: Self-pay | Admitting: Gastroenterology

## 2021-04-19 VITALS — BP 107/53 | HR 62 | Temp 97.3°F | Resp 16

## 2021-04-19 DIAGNOSIS — D125 Benign neoplasm of sigmoid colon: Secondary | ICD-10-CM

## 2021-04-19 DIAGNOSIS — Z800 Family history of malignant neoplasm of digestive organs: Secondary | ICD-10-CM | POA: Diagnosis not present

## 2021-04-19 DIAGNOSIS — D122 Benign neoplasm of ascending colon: Secondary | ICD-10-CM

## 2021-04-19 DIAGNOSIS — K5730 Diverticulosis of large intestine without perforation or abscess without bleeding: Secondary | ICD-10-CM | POA: Diagnosis not present

## 2021-04-19 DIAGNOSIS — K52831 Collagenous colitis: Secondary | ICD-10-CM

## 2021-04-19 DIAGNOSIS — D123 Benign neoplasm of transverse colon: Secondary | ICD-10-CM | POA: Diagnosis not present

## 2021-04-19 HISTORY — PX: COLONOSCOPY: SHX174

## 2021-04-19 MED ORDER — SODIUM CHLORIDE 0.9 % IV SOLN: 500.0000 mL | Freq: Once | INTRAVENOUS | Status: DC

## 2021-04-19 NOTE — Progress Notes (Signed)
Called to room to assist during endoscopic procedure.  Patient ID and intended procedure confirmed with present staff. Received instructions for my participation in the procedure from the performing physician.  

## 2021-04-19 NOTE — Progress Notes (Signed)
pt tolerated well. VSS. awake and to recovery. Report given to RN.  

## 2021-04-19 NOTE — Op Note (Signed)
[facility] Patient Name: Loretta Austin Procedure Date: 04/19/2021 10:24 AM MRN: [id number] Endoscopist: Mallie Mussel L. Loletha Carrow , MD Age: 75 Referring MD:  Date of Birth: Oct 22, 1945 Gender: Female Account #: [id number] Procedure:                Colonoscopy Indications:              Colon cancer screening in patient at increased                            risk: Colorectal cancer in mother, Incidental                            diarrhea noted (known collagenous colitis, on                            budesonide) Medicines:                Monitored Anesthesia Care Procedure:                Pre-Anesthesia Assessment:                           - Prior to the procedure, a History and Physical                            was performed, and patient medications and                            allergies were reviewed. The patient's tolerance of                            previous anesthesia was also reviewed. The risks                            and benefits of the procedure and the sedation                            options and risks were discussed with the patient.                            All questions were answered, and informed consent                            was obtained. Prior Anticoagulants: The patient has                            taken no previous anticoagulant or antiplatelet                            agents. ASA Grade Assessment: III - A patient with                            severe systemic disease. After reviewing the risks  and benefits, the patient was deemed in                            satisfactory condition to undergo the procedure.                           After obtaining informed consent, the colonoscope                            was passed under direct vision. Throughout the                            procedure, the patient's blood pressure, pulse, and                            oxygen saturations were monitored continuously. The                             CF [id number] was introduced through the anus                            and advanced to the the terminal ileum, with                            identification of the appendiceal orifice and IC                            valve. The colonoscopy was performed without                            difficulty. The patient tolerated the procedure                            well. The quality of the bowel preparation was                            excellent. The terminal ileum, ileocecal valve,                            appendiceal orifice, and rectum were photographed. Scope In: 10:45:36 AM Scope Out: 11:04:14 AM Scope Withdrawal Time: 0 hours 16 minutes 13 seconds  Total Procedure Duration: 0 hours 18 minutes 38 seconds  Findings:                 The perianal and digital rectal examinations were                            normal.                           The terminal ileum appeared normal.                           An area of granular mucosa was found in the  proximal transverse colon and in the distal                            ascending colon. Biopsies for histology were taken                            with a cold forceps from the ascending colon,                            transverse colon and descending colon for                            evaluation of microscopic colitis.                           Multiple diverticula were found in the left colon                            and a few in the right colon.                           Four sessile polyps were found in the hepatic                            flexure. The polyps were 2 to 6 mm in size. These                            polyps were removed with a cold snare. Resection                            and retrieval were complete.                           Two sessile polyps were found in the sigmoid colon                            and transverse colon. The polyps were 4 to 6 mm in                             size. These polyps were removed with a cold snare.                            Resection and retrieval were complete.                           The exam was otherwise without abnormality on                            direct and retroflexion views. Complications:            No immediate complications. Estimated Blood Loss:     Estimated blood loss was minimal. Impression:               - The examined portion of the  ileum was normal.                           - Granularity in the proximal transverse colon and                            in the distal ascending colon. Biopsied.                           - Diverticulosis in the left colon and in the right                            colon.                           - Four 2 to 6 mm polyps at the hepatic flexure,                            removed with a cold snare. Resected and retrieved.                           - Two 4 to 6 mm polyps in the sigmoid colon and in                            the transverse colon, removed with a cold snare.                            Resected and retrieved.                           - The examination was otherwise normal on direct                            and retroflexion views. Recommendation:           - Patient has a contact number available for                            emergencies. The signs and symptoms of potential                            delayed complications were discussed with the                            patient. Return to normal activities tomorrow.                            Written discharge instructions were provided to the                            patient.                           - Resume previous diet.                           -  Continue present medications.                           - Await pathology results.                           - Repeat colonoscopy is recommended for                            surveillance. The colonoscopy date will be                             determined after pathology results from today's                            exam become available for review. Daisy Lites L. Loletha Carrow, MD 04/19/2021 11:17:03 AM This report has been signed electronically.

## 2021-04-19 NOTE — Patient Instructions (Signed)
Resume previous diet and medications. Awaiting pathology results. Repeat colonoscopy for surveillance based on path results. Handouts given on Polyps and Diverticulosis.  YOU HAD AN ENDOSCOPIC PROCEDURE TODAY AT [facility]:   Refer to the procedure report that was given to you for any specific questions about what was found during the examination.  If the procedure report does not answer your questions, please call your gastroenterologist to clarify.  If you requested that your care partner not be given the details of your procedure findings, then the procedure report has been included in a sealed envelope for you to review at your convenience later.  YOU SHOULD EXPECT: Some feelings of bloating in the abdomen. Passage of more gas than usual.  Walking can help get rid of the air that was put into your GI tract during the procedure and reduce the bloating. If you had a lower endoscopy (such as a colonoscopy or flexible sigmoidoscopy) you may notice spotting of blood in your stool or on the toilet paper. If you underwent a bowel prep for your procedure, you may not have a normal bowel movement for a few days.  Please Note:  You might notice some irritation and congestion in your nose or some drainage.  This is from the oxygen used during your procedure.  There is no need for concern and it should clear up in a day or so.  SYMPTOMS TO REPORT IMMEDIATELY:  Following lower endoscopy (colonoscopy or flexible sigmoidoscopy):  Excessive amounts of blood in the stool  Significant tenderness or worsening of abdominal pains  Swelling of the abdomen that is new, acute  Fever of 100F or higher   For urgent or emergent issues, a gastroenterologist can be reached at any hour by calling [phone]. Do not use MyChart messaging for urgent concerns.    DIET:  We do recommend a small meal at first, but then you may proceed to your regular diet.  Drink plenty of fluids but you should avoid  alcoholic beverages for 24 hours.  ACTIVITY:  You should plan to take it easy for the rest of today and you should NOT DRIVE or use heavy machinery until tomorrow (because of the sedation medicines used during the test).    FOLLOW UP: Our staff will call the number listed on your records 48-72 hours following your procedure to check on you and address any questions or concerns that you may have regarding the information given to you following your procedure. If we do not reach you, we will leave a message.  We will attempt to reach you two times.  During this call, we will ask if you have developed any symptoms of COVID 19. If you develop any symptoms (ie: fever, flu-like symptoms, shortness of breath, cough etc.) before then, please call [phone].  If you test positive for Covid 19 in the 2 weeks post procedure, please call and report this information to [place].    If any biopsies were taken you will be contacted by phone or by letter within the next 1-3 weeks.  Please call us at [phone] if you have not heard about the biopsies in 3 weeks.    SIGNATURES/CONFIDENTIALITY: You and/or your care partner have signed paperwork which will be entered into your electronic medical record.  These signatures attest to the fact that that the information above on your After Visit Summary has been reviewed and is understood.  Full responsibility of the confidentiality of this discharge information lies with  you and/or your care-partner.

## 2021-04-19 NOTE — Progress Notes (Signed)
Pt's states no medical or surgical changes since previsit or office visit.  VS CW  

## 2021-04-23 ENCOUNTER — Telehealth: Payer: Self-pay | Admitting: *Deleted

## 2021-04-23 ENCOUNTER — Telehealth: Payer: Self-pay

## 2021-04-23 DIAGNOSIS — K219 Gastro-esophageal reflux disease without esophagitis: Secondary | ICD-10-CM | POA: Diagnosis not present

## 2021-04-23 DIAGNOSIS — M542 Cervicalgia: Secondary | ICD-10-CM | POA: Diagnosis not present

## 2021-04-23 DIAGNOSIS — M545 Low back pain, unspecified: Secondary | ICD-10-CM | POA: Diagnosis not present

## 2021-04-23 DIAGNOSIS — G4733 Obstructive sleep apnea (adult) (pediatric): Secondary | ICD-10-CM | POA: Diagnosis not present

## 2021-04-23 DIAGNOSIS — G8929 Other chronic pain: Secondary | ICD-10-CM | POA: Diagnosis not present

## 2021-04-23 DIAGNOSIS — R739 Hyperglycemia, unspecified: Secondary | ICD-10-CM | POA: Diagnosis not present

## 2021-04-23 DIAGNOSIS — M069 Rheumatoid arthritis, unspecified: Secondary | ICD-10-CM | POA: Diagnosis not present

## 2021-04-23 DIAGNOSIS — I10 Essential (primary) hypertension: Secondary | ICD-10-CM | POA: Diagnosis not present

## 2021-04-23 DIAGNOSIS — R8290 Unspecified abnormal findings in urine: Secondary | ICD-10-CM | POA: Diagnosis not present

## 2021-04-23 NOTE — Telephone Encounter (Signed)
Left message on follow up call. 

## 2021-04-23 NOTE — Telephone Encounter (Signed)
Patient returned the call states she is well no questions at this time just waiting on Path results.

## 2021-04-23 NOTE — Telephone Encounter (Signed)
  Follow up Call-  Call back number 04/19/2021  Post procedure Call Back phone  # [phone]  Permission to leave phone message Yes  Some recent data might be hidden    No answer at 2nd attempt follow up phone call.  Left message on voicemail.

## 2021-04-24 ENCOUNTER — Encounter: Payer: Self-pay | Admitting: Gastroenterology

## 2021-04-30 DIAGNOSIS — M150 Primary generalized (osteo)arthritis: Secondary | ICD-10-CM | POA: Diagnosis not present

## 2021-04-30 DIAGNOSIS — M3500 Sicca syndrome, unspecified: Secondary | ICD-10-CM | POA: Diagnosis not present

## 2021-04-30 DIAGNOSIS — M0609 Rheumatoid arthritis without rheumatoid factor, multiple sites: Secondary | ICD-10-CM | POA: Diagnosis not present

## 2021-04-30 DIAGNOSIS — Z79899 Other long term (current) drug therapy: Secondary | ICD-10-CM | POA: Diagnosis not present

## 2021-04-30 DIAGNOSIS — Z6829 Body mass index (BMI) 29.0-29.9, adult: Secondary | ICD-10-CM | POA: Diagnosis not present

## 2021-04-30 DIAGNOSIS — R5383 Other fatigue: Secondary | ICD-10-CM | POA: Diagnosis not present

## 2021-04-30 DIAGNOSIS — E663 Overweight: Secondary | ICD-10-CM | POA: Diagnosis not present

## 2021-05-21 DIAGNOSIS — R6883 Chills (without fever): Secondary | ICD-10-CM | POA: Diagnosis not present

## 2021-05-21 DIAGNOSIS — R0981 Nasal congestion: Secondary | ICD-10-CM | POA: Diagnosis not present

## 2021-05-21 DIAGNOSIS — J209 Acute bronchitis, unspecified: Secondary | ICD-10-CM | POA: Diagnosis not present

## 2021-05-21 DIAGNOSIS — Z1152 Encounter for screening for COVID-19: Secondary | ICD-10-CM | POA: Diagnosis not present

## 2021-05-21 DIAGNOSIS — J309 Allergic rhinitis, unspecified: Secondary | ICD-10-CM | POA: Diagnosis not present

## 2021-05-21 DIAGNOSIS — J189 Pneumonia, unspecified organism: Secondary | ICD-10-CM | POA: Diagnosis not present

## 2021-05-21 DIAGNOSIS — R5383 Other fatigue: Secondary | ICD-10-CM | POA: Diagnosis not present

## 2021-06-04 ENCOUNTER — Ambulatory Visit (INDEPENDENT_AMBULATORY_CARE_PROVIDER_SITE_OTHER): Payer: Medicare Other

## 2021-06-04 ENCOUNTER — Encounter: Payer: Self-pay | Admitting: Orthopaedic Surgery

## 2021-06-04 ENCOUNTER — Other Ambulatory Visit: Payer: Self-pay

## 2021-06-04 ENCOUNTER — Ambulatory Visit (INDEPENDENT_AMBULATORY_CARE_PROVIDER_SITE_OTHER): Payer: Medicare Other | Admitting: Orthopaedic Surgery

## 2021-06-04 DIAGNOSIS — M25551 Pain in right hip: Secondary | ICD-10-CM | POA: Diagnosis not present

## 2021-06-04 NOTE — Progress Notes (Signed)
Office Visit Note   Patient: Loretta Austin           Date of Birth: [birth year]           MRN: [id number] Visit Date: 06/04/2021              Requested by: Leanna Battles, [place],  [place] [address] PCP: Leanna Battles, MD   Assessment & Plan: Visit Diagnoses:  1. Pain in right hip     Plan: Impression is a chronic right lateral hip pain.  I suspect that she likely has underlying abductor tendinosis may not be truly just a bursitis.  She is already had 2 injections so far and the most recent one really did not work that well therefore I have recommended an MRI to not only evaluate for abductor tendinopathy given failure of conservative treatments and also to fully evaluate the mass that I found on exam.  Follow-up after the MRI.  Follow-Up Instructions: No follow-ups on file.   Orders:  Orders Placed This Encounter  Procedures   XR HIP UNILAT W OR W/O PELVIS 2-3 VIEWS RIGHT   MR Hip Right w/o contrast   No orders of the defined types were placed in this encounter.     Procedures: No procedures performed   Clinical Data: No additional findings.   Subjective: Chief Complaint  Patient presents with   Right Hip - Follow-up    Loretta Austin is a 75 year old female here for evaluation of chronic right lateral hip pain.  Pain is 2 out of 10.  She endorses aching throbbing and soreness and occasionally uses CBD tablets for the pain.  Denies any injuries or previous surgeries.  She has had 2 prior injections when she lived in [place] and she states the first 1 worked really well and the second 1 was only partially effective.  She has noticed some altered gait.  She is also noticed a mass on the lateral aspect of her hip.   Review of Systems  Constitutional: Negative.   HENT: Negative.    Eyes: Negative.   Respiratory: Negative.    Cardiovascular: Negative.   Endocrine: Negative.   Musculoskeletal: Negative.   Neurological: Negative.   Hematological:  Negative.   Psychiatric/Behavioral: Negative.    All other systems reviewed and are negative.   Objective: Vital Signs: There were no vitals taken for this visit.  Physical Exam Vitals and nursing note reviewed.  Constitutional:      Appearance: She is well-developed.  Pulmonary:     Effort: Pulmonary effort is normal.  Skin:    General: Skin is warm.     Capillary Refill: Capillary refill takes less than 2 seconds.  Neurological:     Mental Status: She is alert and oriented to person, place, and time.  Psychiatric:        Behavior: Behavior normal.        Thought Content: Thought content normal.        Judgment: Judgment normal.    Ortho Exam  Right hip shows a small palpable mass on the lateral aspect of the hip that feels subcutaneous.  There is no soft tissue crepitus.  No significant pain with hip abduction or passive stretch with Ober test.  No groin pain with range of motion.  Specialty Comments:  No specialty comments available.  Imaging: XR HIP UNILAT W OR W/O PELVIS 2-3 VIEWS RIGHT  Result Date: 06/04/2021 Mild osteoarthritis of the hip joints.  No structural abnormalities.  PMFS History: Patient Active Problem List   Diagnosis Date Noted   Coronary artery calcification 05/30/2020   Hyponatremia 03/08/2020   UTI (urinary tract infection) 03/08/2020   Microscopic colitis    Hypokalemia    Renal insufficiency    Combined abdominal pain, vomiting, and diarrhea    Hypertension    Rheumatoid arthritis ([place])    Obstructive sleep apnea 04/25/2018   Lung nodule < 6cm on CT 04/25/2018   Cough 04/25/2018   Past Medical History:  Diagnosis Date   Arthritis    Cholecystolithiasis    Colon polyps    Diverticulosis    Food poisoning    GERD (gastroesophageal reflux disease)    High blood pressure    Microscopic colitis    Sjogren's disease ([place])    Sleep apnea     Family History  Problem Relation Age of Onset   Lung cancer Mother    Colon cancer  Mother 34   Esophageal cancer Neg Hx    Rectal cancer Neg Hx    Stomach cancer Neg Hx    Colon polyps Neg Hx     Past Surgical History:  Procedure Laterality Date   COLONOSCOPY  04/19/2021   02/12/16-at [facility]   TONSILLECTOMY AND ADENOIDECTOMY     TUBAL LIGATION     Social History   Occupational History   Occupation: retired  Tobacco Use   Smoking status: Former    Types: Cigarettes   Smokeless tobacco: Never  Vaping Use   Vaping Use: Never used  Substance and Sexual Activity   Alcohol use: Yes    Alcohol/week: 1.0 standard drink    Types: 1 Standard drinks or equivalent per week   Drug use: Not Currently   Sexual activity: Not Currently    Partners: Male

## 2021-06-07 ENCOUNTER — Telehealth: Payer: Self-pay | Admitting: Orthopaedic Surgery

## 2021-06-07 DIAGNOSIS — Z23 Encounter for immunization: Secondary | ICD-10-CM | POA: Diagnosis not present

## 2021-06-07 NOTE — Telephone Encounter (Signed)
Called pt 1X and left vm for MRI review appt with Dr. Erlinda Hong after 06/18/21

## 2021-06-11 DIAGNOSIS — R059 Cough, unspecified: Secondary | ICD-10-CM | POA: Diagnosis not present

## 2021-06-14 DIAGNOSIS — Z23 Encounter for immunization: Secondary | ICD-10-CM | POA: Diagnosis not present

## 2021-06-18 ENCOUNTER — Other Ambulatory Visit: Payer: Self-pay

## 2021-06-18 ENCOUNTER — Ambulatory Visit
Admission: RE | Admit: 2021-06-18 | Discharge: 2021-06-18 | Disposition: A | Discharge status: Home / Self Care | Payer: Medicare Other | Source: Ambulatory Visit | Attending: Orthopaedic Surgery | Admitting: Orthopaedic Surgery

## 2021-06-18 DIAGNOSIS — M25551 Pain in right hip: Secondary | ICD-10-CM | POA: Diagnosis not present

## 2021-06-20 ENCOUNTER — Telehealth: Payer: Self-pay | Admitting: Orthopaedic Surgery

## 2021-06-20 DIAGNOSIS — H04123 Dry eye syndrome of bilateral lacrimal glands: Secondary | ICD-10-CM | POA: Diagnosis not present

## 2021-06-20 DIAGNOSIS — H43393 Other vitreous opacities, bilateral: Secondary | ICD-10-CM | POA: Diagnosis not present

## 2021-06-20 DIAGNOSIS — H524 Presbyopia: Secondary | ICD-10-CM | POA: Diagnosis not present

## 2021-06-20 NOTE — Telephone Encounter (Signed)
yes

## 2021-06-20 NOTE — Telephone Encounter (Signed)
Pt called stating she would like to go ahead with the MRI and would like to have the referral put in.   [phone]

## 2021-06-21 ENCOUNTER — Other Ambulatory Visit: Payer: Self-pay

## 2021-06-21 DIAGNOSIS — M545 Low back pain, unspecified: Secondary | ICD-10-CM

## 2021-06-21 NOTE — Telephone Encounter (Signed)
Patient aware.

## 2021-06-21 NOTE — Telephone Encounter (Signed)
MRI order made

## 2021-06-25 ENCOUNTER — Ambulatory Visit: Payer: Medicare Other | Admitting: Orthopaedic Surgery

## 2021-07-02 ENCOUNTER — Ambulatory Visit: Payer: Medicare Other | Admitting: Orthopaedic Surgery

## 2021-07-06 ENCOUNTER — Other Ambulatory Visit: Payer: Self-pay

## 2021-07-06 ENCOUNTER — Ambulatory Visit
Admission: RE | Admit: 2021-07-06 | Discharge: 2021-07-06 | Disposition: A | Discharge status: Home / Self Care | Payer: Medicare Other | Source: Ambulatory Visit | Attending: Orthopaedic Surgery | Admitting: Orthopaedic Surgery

## 2021-07-06 DIAGNOSIS — M48061 Spinal stenosis, lumbar region without neurogenic claudication: Secondary | ICD-10-CM | POA: Diagnosis not present

## 2021-07-06 DIAGNOSIS — M545 Low back pain, unspecified: Secondary | ICD-10-CM | POA: Diagnosis not present

## 2021-07-11 ENCOUNTER — Other Ambulatory Visit: Payer: Self-pay

## 2021-07-11 ENCOUNTER — Encounter: Payer: Self-pay | Admitting: Orthopaedic Surgery

## 2021-07-11 ENCOUNTER — Ambulatory Visit (INDEPENDENT_AMBULATORY_CARE_PROVIDER_SITE_OTHER): Payer: Medicare Other | Admitting: Orthopaedic Surgery

## 2021-07-11 DIAGNOSIS — M545 Low back pain, unspecified: Secondary | ICD-10-CM

## 2021-07-11 MED ORDER — MELOXICAM 7.5 MG PO TABS: 7.5000 mg | ORAL_TABLET | Freq: Two times a day (BID) | ORAL | 2 refills | Status: DC | PRN

## 2021-07-11 MED ORDER — PREDNISONE 10 MG (21) PO TBPK: ORAL_TABLET | ORAL | 3 refills | Status: DC

## 2021-07-11 NOTE — Progress Notes (Signed)
Office Visit Note   Patient: Loretta Austin           Date of Birth: 04/30/1946           MRN: [id number] Visit Date: 07/11/2021              Requested by: Leanna Battles, [place],  [place] [address] PCP: Leanna Battles, MD   Assessment & Plan: Visit Diagnoses:  1. Low back pain, unspecified back pain laterality, unspecified chronicity, unspecified whether sciatica present     Plan: Right hip MRI shows some mild chondral thinning and small anterior superior labral tear.  Abductor tendons are unremarkable.  Lumbar spine MRI shows more degenerative changes and slight stenosis and facet disease.  Fortunately her symptoms are relatively mild unless she is more active.  She is concerned that when she goes to [place] her symptoms will become much worse.  I recommend a course of outpatient physical therapy for core strengthening.  I also recommended anti-inflammatory medication to have on hand during her upcoming trips.  Reassurance was provided that hip replacement is not needed based on MRI findings.  Follow-Up Instructions: No follow-ups on file.   Orders:  Orders Placed This Encounter  Procedures   Ambulatory referral to Physical Therapy   Meds ordered this encounter  Medications   predniSONE (STERAPRED UNI-PAK 21 TAB) 10 MG (21) TBPK tablet    Sig: Take as directed    Dispense:  21 tablet    Refill:  3   meloxicam (MOBIC) 7.5 MG tablet    Sig: Take 1 tablet (7.5 mg total) by mouth 2 (two) times daily as needed for pain.    Dispense:  30 tablet    Refill:  2      Procedures: No procedures performed   Clinical Data: No additional findings.   Subjective: Chief Complaint  Patient presents with   Lower Back - Follow-up    MRI review    Loretta Austin returns today to discuss MRI of the lumbar spine and right hip.  Overall pain is 2 out of 10 as long as she does not do a lot of walking.   Review of Systems   Objective: Vital Signs: There were no vitals  taken for this visit.  Physical Exam  Ortho Exam  Right hip and lumbar spine exams are unchanged.  Specialty Comments:  No specialty comments available.  Imaging: No results found.   PMFS History: Patient Active Problem List   Diagnosis Date Noted   Coronary artery calcification 05/30/2020   Hyponatremia 03/08/2020   UTI (urinary tract infection) 03/08/2020   Microscopic colitis    Hypokalemia    Renal insufficiency    Combined abdominal pain, vomiting, and diarrhea    Hypertension    Rheumatoid arthritis ([place])    Obstructive sleep apnea 04/25/2018   Lung nodule < 6cm on CT 04/25/2018   Cough 04/25/2018   Past Medical History:  Diagnosis Date   Arthritis    Cholecystolithiasis    Colon polyps    Diverticulosis    Food poisoning    GERD (gastroesophageal reflux disease)    High blood pressure    Microscopic colitis    Sjogren's disease ([place])    Sleep apnea     Family History  Problem Relation Age of Onset   Lung cancer Mother    Colon cancer Mother 53   Esophageal cancer Neg Hx    Rectal cancer Neg Hx    Stomach cancer Neg  Hx    Colon polyps Neg Hx     Past Surgical History:  Procedure Laterality Date   COLONOSCOPY  04/19/2021   02/12/16-at [facility]   TONSILLECTOMY AND ADENOIDECTOMY     TUBAL LIGATION     Social History   Occupational History   Occupation: retired  Tobacco Use   Smoking status: Former    Types: Cigarettes   Smokeless tobacco: Never  Vaping Use   Vaping Use: Never used  Substance and Sexual Activity   Alcohol use: Yes    Alcohol/week: 1.0 standard drink    Types: 1 Standard drinks or equivalent per week   Drug use: Not Currently   Sexual activity: Not Currently    Partners: Male

## 2021-07-25 ENCOUNTER — Encounter: Payer: Self-pay | Admitting: Rehabilitative and Restorative Service Providers"

## 2021-07-25 ENCOUNTER — Ambulatory Visit (INDEPENDENT_AMBULATORY_CARE_PROVIDER_SITE_OTHER): Payer: Medicare Other | Admitting: Rehabilitative and Restorative Service Providers"

## 2021-07-25 ENCOUNTER — Other Ambulatory Visit: Payer: Self-pay

## 2021-07-25 DIAGNOSIS — M545 Low back pain, unspecified: Secondary | ICD-10-CM | POA: Diagnosis not present

## 2021-07-25 DIAGNOSIS — R262 Difficulty in walking, not elsewhere classified: Secondary | ICD-10-CM

## 2021-07-25 DIAGNOSIS — R293 Abnormal posture: Secondary | ICD-10-CM

## 2021-07-25 DIAGNOSIS — R600 Localized edema: Secondary | ICD-10-CM

## 2021-07-25 DIAGNOSIS — M6281 Muscle weakness (generalized): Secondary | ICD-10-CM

## 2021-07-25 DIAGNOSIS — G8929 Other chronic pain: Secondary | ICD-10-CM

## 2021-07-25 NOTE — Patient Instructions (Signed)
Access Code: [email or web address] URL: [email or web address] Date: 07/25/2021 Prepared by: Vista Mink  Exercises Standing Scapular Retraction - 5 x daily - 7 x weekly - 1 sets - 5 reps - 5 second hold Standing Lumbar Extension at [place] - 5 x daily - 7 x weekly - 1 sets - 5 reps - 3 seconds hold Supine Figure 4 Piriformis Stretch - 2-3 x daily - 7 x weekly - 1 sets - 5 reps - 20 seconds hold Standing Hip Hiking - 2 x daily - 7 x weekly - 2 sets - 10 reps - 3 seconds hold

## 2021-07-25 NOTE — Therapy (Signed)
[facility] [address] [place], [place], [address] Phone: [phone]   Fax:  [phone]  Physical Therapy Evaluation  Patient Details  Name: Marzetta Lanza MRN: [id number] Date of Birth: Dec 04, 1945 Referring Provider (PT): Leandrew Koyanagi MD   Encounter Date: 07/25/2021   PT End of Session - 07/25/21 1721     Visit Number 1    Number of Visits 16    Date for PT Re-Evaluation 09/20/21    PT Start Time 3299    PT Stop Time 1100    PT Time Calculation (min) 45 min    Activity Tolerance Patient tolerated treatment well    Behavior During Therapy [facility] for tasks assessed/performed             Past Medical History:  Diagnosis Date   Arthritis    Cholecystolithiasis    Colon polyps    Diverticulosis    Food poisoning    GERD (gastroesophageal reflux disease)    High blood pressure    Microscopic colitis    Sjogren's disease ([place])    Sleep apnea     Past Surgical History:  Procedure Laterality Date   COLONOSCOPY  04/19/2021   02/12/16-at [place]   TONSILLECTOMY AND ADENOIDECTOMY     TUBAL LIGATION      There were no vitals filed for this visit.    Subjective Assessment - 07/25/21 1023     Subjective [name] reports her R hip has been bothering her since a trip to [place] last year.  She has several trips planned next year and wants to be healthy and be able to walk without pain.    Pertinent History HTN, rheumatoid and osteoarthritis    Limitations Sitting;Walking;Lifting;House hold activities;Standing    How long can you sit comfortably? Encouraged to change position frequently    How long can you stand comfortably? < 10 minutes    How long can you walk comfortably? Short walks with the dog    Patient Stated Goals Be able to walk for long periods of time to travel    Currently in Pain? Yes    Pain Score 4     Pain Location Hip    Pain Orientation Right    Pain Descriptors / Indicators Aching;Tightness;Sore    Pain Type  Chronic pain    Pain Radiating Towards Can get to gluteals    Pain Onset More than a month ago    Pain Frequency Constant    Aggravating Factors  Weight-bearing    Pain Relieving Factors Pain meds    Effect of Pain on Daily Activities Unable to stand and walk for long periods of time (> 10 minutes)    Multiple Pain Sites No                [facility] PT Assessment - 07/25/21 0001       Assessment   Medical Diagnosis Low back pain    Referring Provider (PT) Georga Kaufmann Ephriam Jenkins MD    Onset Date/Surgical Date --   Chronic     Precautions   Precautions Back    Precaution Comments Avoid prolonged postures, flexed postures, bending, lifting and twisting      Restrictions   Weight Bearing Restrictions No    Other Position/Activity Restrictions No      Balance Screen   Has the patient fallen in the past 6 months No    Has the patient had a decrease in activity level because of a fear of  falling?  No    Is the patient reluctant to leave their home because of a fear of falling?  No      Prior Function   Level of Independence Independent    Leisure Travel internationally      Cognition   Overall Cognitive Status Within Functional Limits for tasks assessed      Observation/Other Assessments   Focus on Therapeutic Outcomes (FOTO)  53 (Goal 64 in 11 visits)      ROM / Strength   AROM / PROM / Strength AROM;Strength      AROM   Overall AROM  Deficits    AROM Assessment Site Lumbar;Hip;Ankle    Right/Left Hip Left;Right    Right Hip Flexion 105    Right Hip External Rotation  18    Right Hip Internal Rotation  14    Left Hip Flexion 100    Left Hip External Rotation  14    Left Hip Internal Rotation  12    Right/Left Ankle Left;Right    Right Ankle Dorsiflexion 2    Left Ankle Dorsiflexion 7    Lumbar Extension 15    Lumbar - Right Side Bend 35    Lumbar - Left Side Bend 35      Strength   Overall Strength Comments Hip abductors and postural weaknesses noted with functional  testing      Flexibility   Soft Tissue Assessment /Muscle Length yes    Hamstrings 45 degrees/50 degrees                        Objective measurements completed on examination: See above findings.       [place] Adult PT Treatment/Exercise - 07/25/21 0001       Posture/Postural Control   Posture/Postural Control Postural limitations    Postural Limitations Forward head;Rounded Shoulders;Decreased lumbar lordosis      Therapeutic Activites    Therapeutic Activities ADL's;Other Therapeutic Activities    ADL's Bed mobility, basic postural awareness, walking prescription    Other Therapeutic Activities Review spine anatomy and exam findings      Exercises   Exercises Lumbar      Lumbar Exercises: Stretches   Figure 4 Stretch 4 reps;20 seconds    Other Lumbar Stretch Exercise Trunk extension AROM (hips forward) 10X 3 seconds      Lumbar Exercises: Standing   Scapular Retraction Strengthening;Both;10 reps;Limitations    Scapular Retraction Limitations 5 seconds (shoulder blade pinches)    Other Standing Lumbar Exercises Alternating hip hike 2 sets of 10 for 3 seconds                     PT Education - 07/25/21 1720     Education Details Reviewed basic postural and spine anatomy information, log roll, walking and HEP prescription.  Started basic HEP.    Person(s) Educated Patient    Methods Explanation;Demonstration;Tactile cues;Verbal cues;Handout    Comprehension Verbalized understanding;Tactile cues required;Need further instruction;Returned demonstration;Verbal cues required              PT Short Term Goals - 07/25/21 1726       PT SHORT TERM GOAL #1   Title [name] will improve B ankle dorsiflexion to 10 degrees.    Baseline 2-7 degrees    Time 4    Period Weeks    Status New    Target Date 08/22/21      PT SHORT TERM GOAL #2  Title Improve B hip ER AROM to 40 degrees.    Baseline 14-18 degrees    Time 4    Period Weeks    Status  New    Target Date 08/22/21               PT Long Term Goals - 07/25/21 1727       PT LONG TERM GOAL #1   Title Improve FOTO score to 64.    Baseline 53    Time 8    Period Weeks    Status New    Target Date 09/20/21      PT LONG TERM GOAL #2   Title [name] will be able to stand for > 1 hour and walk for 2+ miles with no increase in low back and R hip pain.    Baseline 5-10 minutes    Time 8    Period Weeks    Status New    Target Date 09/20/21      PT LONG TERM GOAL #3   Title [name] will have improved low back and B leg strength as assessed by objective measures and functional self-report.    Baseline Postural weakness and hip abductors weakness noted at examination.    Time 8    Period Weeks    Status New    Target Date 09/20/21      PT LONG TERM GOAL #4   Title [name] will be independent with her long-term HEP at [place].    Baseline Assigned today    Time 8    Period Weeks    Status New    Target Date 09/20/21                    Plan - 07/25/21 1721     Clinical Impression Statement [name] is poorly conditioned with significant core and leg strength deficits.  She is unable to stand and walk for long periods of time.  She wants to take 2 big international trips next year and needs to be able to stand and walk for long periods of time to participate.  She will benefit from education on posture and body mechanics, leg and core strength work to meet long-term goals.    Personal Factors and Comorbidities Comorbidity 2    Comorbidities HTN and arthritis (osteo and rheumatoid)    Examination-Activity Limitations Stairs;Stand;Bed Mobility;Bend;Lift;Carry;Locomotion Level;Squat    Examination-Participation Restrictions Interpersonal Relationship;Community Activity    Stability/Clinical Decision Making Stable/Uncomplicated    Clinical Decision Making Low    Rehab Potential Good    PT Frequency 2x / week    PT Duration 8 weeks    PT Treatment/Interventions  ADLs/Self Care Home Management;Cryotherapy;Traction;Functional mobility training;Therapeutic activities;Neuromuscular re-education;Therapeutic exercise;Patient/family education;Manual techniques;Dry needling    PT Next Visit Plan Review HEP, practical posture and body mechanics work, low back and leg strength (hip abductors and quadriceps) progressions    PT Home Exercise Plan Access Code: [email or web address]    Consulted and Agree with Plan of Care Patient             Patient will benefit from skilled therapeutic intervention in order to improve the following deficits and impairments:  Decreased activity tolerance, Decreased endurance, Decreased range of motion, Difficulty walking, Decreased strength, Increased edema, Increased muscle spasms, Impaired flexibility, Improper body mechanics, Postural dysfunction, Pain  Visit Diagnosis: Abnormal posture  Difficulty in walking, not elsewhere classified  Muscle weakness (generalized)  Localized edema  Chronic right-sided low back pain, unspecified whether  sciatica present     Problem List Patient Active Problem List   Diagnosis Date Noted   Coronary artery calcification 05/30/2020   Hyponatremia 03/08/2020   UTI (urinary tract infection) 03/08/2020   Microscopic colitis    Hypokalemia    Renal insufficiency    Combined abdominal pain, vomiting, and diarrhea    Hypertension    Rheumatoid arthritis ([place])    Obstructive sleep apnea 04/25/2018   Lung nodule < 6cm on CT 04/25/2018   Cough 04/25/2018    Farley Ly, PT, MPT 07/25/2021, 5:31 PM  [facility] [address] [place], [place], [address] Phone: [phone]   Fax:  [phone]  Name: Corayma Cashatt MRN: [id number] Date of Birth: May 20, 1946

## 2021-07-30 ENCOUNTER — Encounter: Payer: Self-pay | Admitting: Physical Therapy

## 2021-07-30 ENCOUNTER — Ambulatory Visit (INDEPENDENT_AMBULATORY_CARE_PROVIDER_SITE_OTHER): Payer: Medicare Other | Admitting: Physical Therapy

## 2021-07-30 ENCOUNTER — Other Ambulatory Visit: Payer: Self-pay

## 2021-07-30 DIAGNOSIS — G8929 Other chronic pain: Secondary | ICD-10-CM

## 2021-07-30 DIAGNOSIS — R600 Localized edema: Secondary | ICD-10-CM

## 2021-07-30 DIAGNOSIS — M6281 Muscle weakness (generalized): Secondary | ICD-10-CM

## 2021-07-30 DIAGNOSIS — M545 Low back pain, unspecified: Secondary | ICD-10-CM

## 2021-07-30 DIAGNOSIS — R262 Difficulty in walking, not elsewhere classified: Secondary | ICD-10-CM | POA: Diagnosis not present

## 2021-07-30 DIAGNOSIS — R293 Abnormal posture: Secondary | ICD-10-CM

## 2021-07-30 NOTE — Therapy (Signed)
[facility] Therapy [address] [place], [place], [address] Phone: [phone]   Fax:  [phone]  Physical Therapy Treatment  Patient Details  Name: Loretta Austin MRN: [id number] Date of Birth: 05-08-1946 Referring Provider (PT): Leandrew Koyanagi MD   Encounter Date: 07/30/2021   PT End of Session - 07/30/21 1057     Visit Number 2    Number of Visits 16    Date for PT Re-Evaluation 09/20/21    PT Start Time 1016    PT Stop Time 1057    PT Time Calculation (min) 41 min    Activity Tolerance Patient tolerated treatment well    Behavior During Therapy [facility] for tasks assessed/performed             Past Medical History:  Diagnosis Date   Arthritis    Cholecystolithiasis    Colon polyps    Diverticulosis    Food poisoning    GERD (gastroesophageal reflux disease)    High blood pressure    Microscopic colitis    Sjogren's disease ([place])    Sleep apnea     Past Surgical History:  Procedure Laterality Date   COLONOSCOPY  04/19/2021   02/12/16-at [place]   TONSILLECTOMY AND ADENOIDECTOMY     TUBAL LIGATION      There were no vitals filed for this visit.   Subjective Assessment - 07/30/21 1019     Subjective doing exercises every day - probably about 1/2 as much as he wanted    Pertinent History HTN, rheumatoid and osteoarthritis    Limitations Sitting;Walking;Lifting;House hold activities;Standing    How long can you sit comfortably? Encouraged to change position frequently    How long can you stand comfortably? < 10 minutes    How long can you walk comfortably? Short walks with the dog    Patient Stated Goals Be able to walk for long periods of time to travel    Currently in Pain? Yes    Pain Score 2     Pain Location Generalized    Pain Descriptors / Indicators Aching;Dull    Pain Type Chronic pain    Pain Onset More than a month ago    Pain Frequency Constant    Aggravating Factors  weight-bearing    Pain Relieving Factors  pain medication                               [facility] Adult PT Treatment/Exercise - 07/30/21 1020       Lumbar Exercises: Stretches   Single Knee to Chest Stretch Right;Left;3 reps;30 seconds    Lower Trunk Rotation 3 reps;30 seconds    Lower Trunk Rotation Limitations bil    Figure 4 Stretch Limitations reviewed and trialed knee to opposite shoulder as well as sitting    Other Lumbar Stretch Exercise Trunk extension AROM (hips forward) 10X 5 seconds      Lumbar Exercises: Aerobic   Nustep L5 x 8 min      Lumbar Exercises: Standing   Scapular Retraction Strengthening;Both;10 reps;Limitations    Theraband Level (Scapular Retraction) Level 4 (Blue)    Other Standing Lumbar Exercises Alternating hip hike 2 sets of 10 for 3 seconds      Lumbar Exercises: Seated   Other Seated Lumbar Exercises scapular retraction 10 x 5 sec hold      Lumbar Exercises: Supine   Pelvic Tilt 10 reps;5 seconds    Clam  10 reps    Clam Limitations single limb; performed bil with L3 band    Bridge 10 reps;5 seconds                       PT Short Term Goals - 07/25/21 1726       PT SHORT TERM GOAL #1   Title [name] will improve B ankle dorsiflexion to 10 degrees.    Baseline 2-7 degrees    Time 4    Period Weeks    Status New    Target Date 08/22/21      PT SHORT TERM GOAL #2   Title Improve B hip ER AROM to 40 degrees.    Baseline 14-18 degrees    Time 4    Period Weeks    Status New    Target Date 08/22/21               PT Long Term Goals - 07/25/21 1727       PT LONG TERM GOAL #1   Title Improve FOTO score to 64.    Baseline 53    Time 8    Period Weeks    Status New    Target Date 09/20/21      PT LONG TERM GOAL #2   Title [name] will be able to stand for > 1 hour and walk for 2+ miles with no increase in low back and R hip pain.    Baseline 5-10 minutes    Time 8    Period Weeks    Status New    Target Date 09/20/21      PT LONG TERM  GOAL #3   Title [name] will have improved low back and B leg strength as assessed by objective measures and functional self-report.    Baseline Postural weakness and hip abductors weakness noted at examination.    Time 8    Period Weeks    Status New    Target Date 09/20/21      PT LONG TERM GOAL #4   Title [name] will be independent with her long-term HEP at DC.    Baseline Assigned today    Time 8    Period Weeks    Status New    Target Date 09/20/21                   Plan - 07/30/21 1057     Clinical Impression Statement Pt tolerated session well today with review of HEP and continued work on core/hip strengthening as well as endurance.  Will continue to benefit from PT to maximize function.    Personal Factors and Comorbidities Comorbidity 2    Comorbidities HTN and arthritis (osteo and rheumatoid)    Examination-Activity Limitations Stairs;Stand;Bed Mobility;Bend;Lift;Carry;Locomotion Level;Squat    Examination-Participation Restrictions Interpersonal Relationship;Community Activity    Stability/Clinical Decision Making Stable/Uncomplicated    Rehab Potential Good    PT Frequency 2x / week    PT Duration 8 weeks    PT Treatment/Interventions ADLs/Self Care Home Management;Cryotherapy;Traction;Functional mobility training;Therapeutic activities;Neuromuscular re-education;Therapeutic exercise;Patient/family education;Manual techniques;Dry needling    PT Next Visit Plan practical posture and body mechanics work, low back and leg strength (hip abductors and quadriceps) progressions    PT Home Exercise Plan Access Code: [email or web address]    Consulted and Agree with Plan of Care Patient             Patient will benefit from skilled therapeutic intervention in order to improve  the following deficits and impairments:  Decreased activity tolerance, Decreased endurance, Decreased range of motion, Difficulty walking, Decreased strength, Increased edema, Increased muscle spasms,  Impaired flexibility, Improper body mechanics, Postural dysfunction, Pain  Visit Diagnosis: Abnormal posture  Difficulty in walking, not elsewhere classified  Muscle weakness (generalized)  Localized edema  Chronic right-sided low back pain, unspecified whether sciatica present     Problem List Patient Active Problem List   Diagnosis Date Noted   Coronary artery calcification 05/30/2020   Hyponatremia 03/08/2020   UTI (urinary tract infection) 03/08/2020   Microscopic colitis    Hypokalemia    Renal insufficiency    Combined abdominal pain, vomiting, and diarrhea    Hypertension    Rheumatoid arthritis ([place])    Obstructive sleep apnea 04/25/2018   Lung nodule < 6cm on CT 04/25/2018   Cough 04/25/2018      Laureen Abrahams, PT, DPT 07/30/21 10:59 AM     [facility] [address] [place], [place], [address] Phone: [phone]   Fax:  [phone]  Name: Renie Stelmach MRN: [id number] Date of Birth: 03/25/1946

## 2021-08-01 ENCOUNTER — Encounter: Payer: Self-pay | Admitting: Physical Therapy

## 2021-08-01 ENCOUNTER — Other Ambulatory Visit: Payer: Self-pay

## 2021-08-01 ENCOUNTER — Ambulatory Visit (INDEPENDENT_AMBULATORY_CARE_PROVIDER_SITE_OTHER): Payer: Medicare Other | Admitting: Physical Therapy

## 2021-08-01 DIAGNOSIS — R293 Abnormal posture: Secondary | ICD-10-CM

## 2021-08-01 DIAGNOSIS — R600 Localized edema: Secondary | ICD-10-CM | POA: Diagnosis not present

## 2021-08-01 DIAGNOSIS — R262 Difficulty in walking, not elsewhere classified: Secondary | ICD-10-CM | POA: Diagnosis not present

## 2021-08-01 DIAGNOSIS — M6281 Muscle weakness (generalized): Secondary | ICD-10-CM

## 2021-08-01 DIAGNOSIS — G8929 Other chronic pain: Secondary | ICD-10-CM

## 2021-08-01 DIAGNOSIS — M545 Low back pain, unspecified: Secondary | ICD-10-CM | POA: Diagnosis not present

## 2021-08-01 NOTE — Therapy (Signed)
[facility] [address] [place], [place], [address] Phone: [phone]   Fax:  [phone]  Physical Therapy Treatment  Patient Details  Name: Loretta Austin MRN: [id number] Date of Birth: April 27, 1946 Referring Provider (PT): Leandrew Koyanagi MD   Encounter Date: 08/01/2021   PT End of Session - 08/01/21 1056     Visit Number 3    Number of Visits 16    Date for PT Re-Evaluation 09/20/21    PT Start Time 1010    PT Stop Time 1049    PT Time Calculation (min) 39 min    Activity Tolerance Patient tolerated treatment well    Behavior During Therapy [facility] for tasks assessed/performed             Past Medical History:  Diagnosis Date   Arthritis    Cholecystolithiasis    Colon polyps    Diverticulosis    Food poisoning    GERD (gastroesophageal reflux disease)    High blood pressure    Microscopic colitis    Sjogren's disease ([place])    Sleep apnea     Past Surgical History:  Procedure Laterality Date   COLONOSCOPY  04/19/2021   02/12/16-at [place]   TONSILLECTOMY AND ADENOIDECTOMY     TUBAL LIGATION      There were no vitals filed for this visit.   Subjective Assessment - 08/01/21 1013     Subjective doing well - "I know I'm not doing my exercises enough."    Pertinent History HTN, rheumatoid and osteoarthritis    Limitations Sitting;Walking;Lifting;House hold activities;Standing    How long can you sit comfortably? Encouraged to change position frequently    How long can you stand comfortably? < 10 minutes    How long can you walk comfortably? Short walks with the dog    Patient Stated Goals Be able to walk for long periods of time to travel    Currently in Pain? Yes    Pain Score 2     Pain Location Generalized    Pain Orientation Right    Pain Descriptors / Indicators Aching;Dull    Pain Type Chronic pain    Pain Onset More than a month ago    Pain Frequency Constant    Aggravating Factors  weight bearing, walking     Pain Relieving Factors pain meds                               [facility] Adult PT Treatment/Exercise - 08/01/21 1011       Lumbar Exercises: Stretches   Figure 4 Stretch 3 reps;20 seconds      Lumbar Exercises: Aerobic   Nustep L5 x 8 min      Lumbar Exercises: Machines for Strengthening   Leg Press 75# 2x10      Lumbar Exercises: Standing   Heel Raises 20 reps    Functional Squats 20 reps    Functional Squats Limitations bil UE support    Scapular Retraction Strengthening;Both;Limitations;20 reps    Theraband Level (Scapular Retraction) Level 4 (Blue)    Scapular Retraction Limitations 5 sec hold    Other Standing Lumbar Exercises hip abdct x 10 bil      Lumbar Exercises: Seated   Sit to Stand 10 reps    Sit to Stand Limitations with 2# med ball and overhead reach    Other Seated Lumbar Exercises trunk rotation with 2# med ball x  10 reps bil      Lumbar Exercises: Sidelying   Clam Both;20 reps                       PT Short Term Goals - 07/25/21 1726       PT SHORT TERM GOAL #1   Title [name] will improve B ankle dorsiflexion to 10 degrees.    Baseline 2-7 degrees    Time 4    Period Weeks    Status New    Target Date 08/22/21      PT SHORT TERM GOAL #2   Title Improve B hip ER AROM to 40 degrees.    Baseline 14-18 degrees    Time 4    Period Weeks    Status New    Target Date 08/22/21               PT Long Term Goals - 07/25/21 1727       PT LONG TERM GOAL #1   Title Improve FOTO score to 64.    Baseline 53    Time 8    Period Weeks    Status New    Target Date 09/20/21      PT LONG TERM GOAL #2   Title [name] will be able to stand for > 1 hour and walk for 2+ miles with no increase in low back and R hip pain.    Baseline 5-10 minutes    Time 8    Period Weeks    Status New    Target Date 09/20/21      PT LONG TERM GOAL #3   Title [name] will have improved low back and B leg strength as assessed by objective  measures and functional self-report.    Baseline Postural weakness and hip abductors weakness noted at examination.    Time 8    Period Weeks    Status New    Target Date 09/20/21      PT LONG TERM GOAL #4   Title [name] will be independent with her long-term HEP at DC.    Baseline Assigned today    Time 8    Period Weeks    Status New    Target Date 09/20/21                   Plan - 08/01/21 1057     Clinical Impression Statement Pt tolerated strengthening exercises well today without significant increase in symptoms or pain.  Will continue to benefit from PT to maximize function.    Personal Factors and Comorbidities Comorbidity 2    Comorbidities HTN and arthritis (osteo and rheumatoid)    Examination-Activity Limitations Stairs;Stand;Bed Mobility;Bend;Lift;Carry;Locomotion Level;Squat    Examination-Participation Restrictions Interpersonal Relationship;Community Activity    Stability/Clinical Decision Making Stable/Uncomplicated    Rehab Potential Good    PT Frequency 2x / week    PT Duration 8 weeks    PT Treatment/Interventions ADLs/Self Care Home Management;Cryotherapy;Traction;Functional mobility training;Therapeutic activities;Neuromuscular re-education;Therapeutic exercise;Patient/family education;Manual techniques;Dry needling    PT Next Visit Plan low back and leg strength (hip abductors and quadriceps) progressions, core strengthening, body mechanics    PT Home Exercise Plan Access Code: [email or web address]    Consulted and Agree with Plan of Care Patient             Patient will benefit from skilled therapeutic intervention in order to improve the following deficits and impairments:  Decreased activity tolerance, Decreased endurance, Decreased range  of motion, Difficulty walking, Decreased strength, Increased edema, Increased muscle spasms, Impaired flexibility, Improper body mechanics, Postural dysfunction, Pain  Visit Diagnosis: Abnormal posture  Difficulty in  walking, not elsewhere classified  Muscle weakness (generalized)  Localized edema  Chronic right-sided low back pain, unspecified whether sciatica present     Problem List Patient Active Problem List   Diagnosis Date Noted   Coronary artery calcification 05/30/2020   Hyponatremia 03/08/2020   UTI (urinary tract infection) 03/08/2020   Microscopic colitis    Hypokalemia    Renal insufficiency    Combined abdominal pain, vomiting, and diarrhea    Hypertension    Rheumatoid arthritis ([place])    Obstructive sleep apnea 04/25/2018   Lung nodule < 6cm on CT 04/25/2018   Cough 04/25/2018      Laureen Abrahams, PT, DPT 08/01/21 10:58 AM     [facility] [address] [place], [place], [address] Phone: [phone]   Fax:  [phone]  Name: Yizel Canby MRN: [id number] Date of Birth: May 31, 1946

## 2021-08-06 ENCOUNTER — Other Ambulatory Visit: Payer: Self-pay

## 2021-08-06 ENCOUNTER — Ambulatory Visit (INDEPENDENT_AMBULATORY_CARE_PROVIDER_SITE_OTHER): Payer: Medicare Other | Admitting: Physical Therapy

## 2021-08-06 ENCOUNTER — Encounter: Payer: Self-pay | Admitting: Physical Therapy

## 2021-08-06 DIAGNOSIS — M6281 Muscle weakness (generalized): Secondary | ICD-10-CM

## 2021-08-06 DIAGNOSIS — R293 Abnormal posture: Secondary | ICD-10-CM

## 2021-08-06 DIAGNOSIS — R262 Difficulty in walking, not elsewhere classified: Secondary | ICD-10-CM

## 2021-08-06 DIAGNOSIS — R600 Localized edema: Secondary | ICD-10-CM

## 2021-08-06 NOTE — Therapy (Signed)
[facility] [address] [place], [place], [address] Phone: [phone]   Fax:  [phone]  Physical Therapy Treatment  Patient Details  Name: Loretta Austin MRN: [id number] Date of Birth: Mar 03, 1946 Referring Provider (PT): Leandrew Koyanagi MD   Encounter Date: 08/06/2021   PT End of Session - 08/06/21 1053     Visit Number 4    Number of Visits 16    Date for PT Re-Evaluation 09/20/21    PT Start Time 1012    PT Stop Time 1053    PT Time Calculation (min) 41 min    Activity Tolerance Patient tolerated treatment well    Behavior During Therapy [facility] for tasks assessed/performed             Past Medical History:  Diagnosis Date   Arthritis    Cholecystolithiasis    Colon polyps    Diverticulosis    Food poisoning    GERD (gastroesophageal reflux disease)    High blood pressure    Microscopic colitis    Sjogren's disease ([place])    Sleep apnea     Past Surgical History:  Procedure Laterality Date   COLONOSCOPY  04/19/2021   02/12/16-at [place]   TONSILLECTOMY AND ADENOIDECTOMY     TUBAL LIGATION      There were no vitals filed for this visit.   Subjective Assessment - 08/06/21 1012     Subjective doing okay - feels like her leg is starting to get better, hasn't tried walking long distances yet.    Pertinent History HTN, rheumatoid and osteoarthritis    Limitations Sitting;Walking;Lifting;House hold activities;Standing    How long can you sit comfortably? Encouraged to change position frequently    How long can you stand comfortably? < 10 minutes    How long can you walk comfortably? Short walks with the dog    Patient Stated Goals Be able to walk for long periods of time to travel    Currently in Pain? No/denies    Pain Onset --                               [facility] Adult PT Treatment/Exercise - 08/06/21 1016       Lumbar Exercises: Stretches   Single Knee to Chest Stretch Right;Left;3 reps;30  seconds    Figure 4 Stretch 3 reps;20 seconds      Lumbar Exercises: Aerobic   Nustep L5 x 8 min      Lumbar Exercises: Standing   Heel Raises 20 reps    Functional Squats 20 reps    Functional Squats Limitations bil UE support    Other Standing Lumbar Exercises RDL 5# KB 2x10    Other Standing Lumbar Exercises hip abdct and ext 2 x 10 bil; 2#      Lumbar Exercises: Supine   Clam 20 reps    Clam Limitations single limb; performed bil with L4 band                       PT Short Term Goals - 07/25/21 1726       PT SHORT TERM GOAL #1   Title [name] will improve B ankle dorsiflexion to 10 degrees.    Baseline 2-7 degrees    Time 4    Period Weeks    Status New    Target Date 08/22/21      PT SHORT TERM GOAL #  2   Title Improve B hip ER AROM to 40 degrees.    Baseline 14-18 degrees    Time 4    Period Weeks    Status New    Target Date 08/22/21               PT Long Term Goals - 07/25/21 1727       PT LONG TERM GOAL #1   Title Improve FOTO score to 64.    Baseline 53    Time 8    Period Weeks    Status New    Target Date 09/20/21      PT LONG TERM GOAL #2   Title [name] will be able to stand for > 1 hour and walk for 2+ miles with no increase in low back and R hip pain.    Baseline 5-10 minutes    Time 8    Period Weeks    Status New    Target Date 09/20/21      PT LONG TERM GOAL #3   Title [name] will have improved low back and B leg strength as assessed by objective measures and functional self-report.    Baseline Postural weakness and hip abductors weakness noted at examination.    Time 8    Period Weeks    Status New    Target Date 09/20/21      PT LONG TERM GOAL #4   Title [name] will be independent with her long-term HEP at DC.    Baseline Assigned today    Time 8    Period Weeks    Status New    Target Date 09/20/21                   Plan - 08/06/21 1053     Clinical Impression Statement Pt tolerated session well  today without increase in pain.  Encouraged increasing activitiy to see if her pain continues to be low.  Will continue to benefit from PT to maximize function.    Personal Factors and Comorbidities Comorbidity 2    Comorbidities HTN and arthritis (osteo and rheumatoid)    Examination-Activity Limitations Stairs;Stand;Bed Mobility;Bend;Lift;Carry;Locomotion Level;Squat    Examination-Participation Restrictions Interpersonal Relationship;Community Activity    Stability/Clinical Decision Making Stable/Uncomplicated    Rehab Potential Good    PT Frequency 2x / week    PT Duration 8 weeks    PT Treatment/Interventions ADLs/Self Care Home Management;Cryotherapy;Traction;Functional mobility training;Therapeutic activities;Neuromuscular re-education;Therapeutic exercise;Patient/family education;Manual techniques;Dry needling    PT Next Visit Plan low back and leg strength (hip abductors and quadriceps) progressions, core strengthening, body mechanics    PT Home Exercise Plan Access Code: [email or web address]    Consulted and Agree with Plan of Care Patient             Patient will benefit from skilled therapeutic intervention in order to improve the following deficits and impairments:  Decreased activity tolerance, Decreased endurance, Decreased range of motion, Difficulty walking, Decreased strength, Increased edema, Increased muscle spasms, Impaired flexibility, Improper body mechanics, Postural dysfunction, Pain  Visit Diagnosis: Abnormal posture  Difficulty in walking, not elsewhere classified  Muscle weakness (generalized)  Localized edema     Problem List Patient Active Problem List   Diagnosis Date Noted   Coronary artery calcification 05/30/2020   Hyponatremia 03/08/2020   UTI (urinary tract infection) 03/08/2020   Microscopic colitis    Hypokalemia    Renal insufficiency    Combined abdominal pain, vomiting, and diarrhea  Hypertension    Rheumatoid arthritis ([place])    Obstructive  sleep apnea 04/25/2018   Lung nodule < 6cm on CT 04/25/2018   Cough 04/25/2018      Laureen Abrahams, PT, DPT 08/06/21 10:57 AM     [facility] [address] [place], [place], [address] Phone: [phone]   Fax:  [phone]  Name: Lesieli Bresee MRN: [id number] Date of Birth: 1946-06-24

## 2021-08-08 ENCOUNTER — Encounter: Payer: Medicare Other | Admitting: Rehabilitative and Restorative Service Providers"

## 2021-08-09 ENCOUNTER — Ambulatory Visit (INDEPENDENT_AMBULATORY_CARE_PROVIDER_SITE_OTHER): Payer: Medicare Other | Admitting: Rehabilitative and Restorative Service Providers"

## 2021-08-09 ENCOUNTER — Other Ambulatory Visit: Payer: Self-pay

## 2021-08-09 ENCOUNTER — Encounter: Payer: Self-pay | Admitting: Rehabilitative and Restorative Service Providers"

## 2021-08-09 DIAGNOSIS — R293 Abnormal posture: Secondary | ICD-10-CM | POA: Diagnosis not present

## 2021-08-09 DIAGNOSIS — M6281 Muscle weakness (generalized): Secondary | ICD-10-CM

## 2021-08-09 DIAGNOSIS — R262 Difficulty in walking, not elsewhere classified: Secondary | ICD-10-CM | POA: Diagnosis not present

## 2021-08-09 NOTE — Patient Instructions (Signed)
Access Code: [email or web address] URL: [email or web address] Date: 08/09/2021 Prepared by: Vista Mink  Exercises Standing Scapular Retraction - 5 x daily - 7 x weekly - 1 sets - 5 reps - 5 second hold Standing Lumbar Extension at [place] - 5 x daily - 7 x weekly - 1 sets - 5 reps - 3 seconds hold Supine Figure 4 Piriformis Stretch - 2-3 x daily - 7 x weekly - 1 sets - 5 reps - 20 seconds hold Standing Hip Hiking - 2 x daily - 7 x weekly - 2 sets - 10 reps - 3 seconds hold

## 2021-08-09 NOTE — Therapy (Signed)
Loretta Austin [address] [place], [place], [address] Phone: [phone]   Fax:  [phone]  Physical [facility] Treatment  Patient Details  Name: Loretta Austin MRN: [id number] Date of Birth: March 02, 1946 Referring Provider (PT): Leandrew Koyanagi MD   Encounter Date: 08/09/2021   PT End of Session - 08/09/21 1158     Visit Number 5    Number of Visits 16    Date for PT Re-Evaluation 09/20/21    PT Start Time 1100    PT Stop Time 1140    PT Time Calculation (min) 40 min    Activity Tolerance Patient tolerated treatment well;No increased pain    Behavior During [place] WFL for tasks assessed/performed             Past Medical History:  Diagnosis Date   Arthritis    Cholecystolithiasis    Colon polyps    Diverticulosis    Food poisoning    GERD (gastroesophageal reflux disease)    High blood pressure    Microscopic colitis    Sjogren's disease ([place])    Sleep apnea     Past Surgical History:  Procedure Laterality Date   COLONOSCOPY  04/19/2021   02/12/16-at [place]   TONSILLECTOMY AND ADENOIDECTOMY     TUBAL LIGATION      There were no vitals filed for this visit.   Subjective Assessment - 08/09/21 1104     Subjective Loretta Austin notes very little pain since starting PT.  She is most concerned about getting stronger so she can stand and walk for longer periods of time without being stopped by pain.    Pertinent History HTN, rheumatoid and osteoarthritis    Limitations Sitting;Walking;Lifting;House hold activities;Standing    How long can you sit comfortably? Encouraged to change position frequently    How long can you stand comfortably? < 10 minutes    How long can you walk comfortably? Short walks with the dog    Patient Stated Goals Be able to walk for long periods of time to travel    Currently in Pain? No/denies    Pain Score 0-No pain    Pain Location Hip    Pain Orientation Right    Pain Descriptors / Indicators Aching     Pain Type Chronic pain    Pain Radiating Towards Gluteal    Pain Onset More than a month ago    Pain Frequency Occasional    Aggravating Factors  Prolonged postures, standing and walking    Pain Relieving Factors Exercises and change of position    Effect of Pain on Daily Activities Unable to stand or walk for prolonged periods of time    Multiple Pain Sites No                [facility] PT Assessment - 08/09/21 0001       AROM   Right Hip External Rotation  14    Left Hip External Rotation  21                           [facility] Adult PT Treatment/Exercise - 08/09/21 0001       Therapeutic Activites    Therapeutic Activities ADL's;Lifting;Other Therapeutic Activities    ADL's Feeding and watering the dog    Lifting Golfer's and diagonal squat lift    Other Therapeutic Activities Review of disc pressures in various positions and review of appropriate gym exercises (leg press and treadmill)  Exercises   Exercises Lumbar      Lumbar Exercises: Stretches   Figure 4 Stretch 4 reps;20 seconds    Other Lumbar Stretch Exercise Trunk extension AROM (hips forward) 10X 3 seconds      Lumbar Exercises: Machines for Strengthening   Leg Press 75# 10X and 81# 10X slow eccentrics      Lumbar Exercises: Standing   Scapular Retraction Strengthening;Both;10 reps;Limitations    Scapular Retraction Limitations 5 seconds (shoulder blade pinches)    Other Standing Lumbar Exercises Alternating hip hike Hip hiking in doorframe with shoulder against wall 3 sets of  5 each side (R quadratus lumborum is weakest)                     PT Education - 08/09/21 1157     Education Details Spent time reviewing body mechanics (bed mobility, lift techniques) and spine education (disc pressure in various postures).  Progressed hip abductors, quadratus lumborum and LE strength.    Person(s) Educated Patient    Methods Explanation;Demonstration;Tactile cues;Verbal cues;Handout     Comprehension Verbalized understanding;Tactile cues required;Need further instruction;Returned demonstration;Verbal cues required              PT Short Term Goals - 07/25/21 1726       PT SHORT TERM GOAL #1   Title Loretta Austin will improve B ankle dorsiflexion to 10 degrees.    Baseline 2-7 degrees    Time 4    Period Weeks    Status New    Target Date 08/22/21      PT SHORT TERM GOAL #2   Title Improve B hip ER AROM to 40 degrees.    Baseline 14-18 degrees    Time 4    Period Weeks    Status New    Target Date 08/22/21               PT Long Term Goals - 07/25/21 1727       PT LONG TERM GOAL #1   Title Improve FOTO score to 64.    Baseline 53    Time 8    Period Weeks    Status New    Target Date 09/20/21      PT LONG TERM GOAL #2   Title Loretta Austin will be able to stand for > 1 hour and walk for 2+ miles with no increase in low back and R hip pain.    Baseline 5-10 minutes    Time 8    Period Weeks    Status New    Target Date 09/20/21      PT LONG TERM GOAL #3   Title Loretta Austin will have improved low back and B leg strength as assessed by objective measures and functional self-report.    Baseline Postural weakness and hip abductors weakness noted at examination.    Time 8    Period Weeks    Status New    Target Date 09/20/21      PT LONG TERM GOAL #4   Title Loretta Austin will be independent with her long-term HEP at DC.    Baseline Assigned today    Time 8    Period Weeks    Status New    Target Date 09/20/21                   Plan - 08/09/21 1159     Clinical Impression Statement Loretta Austin has not had much pain since starting PT.  She  notes inconsistency with her HEP compliance and body mechanics that will need to improve before her upcoming trips which will require prolonged standing and walking.  Continue strength and body mechanics progressions to meet LTGs.    Personal Factors and Comorbidities Comorbidity 2    Comorbidities HTN and arthritis (osteo  and rheumatoid)    Examination-Activity Limitations Stairs;Stand;Bed Mobility;Bend;Lift;Carry;Locomotion Level;Squat    Examination-Participation Restrictions Interpersonal Relationship;Community Activity    Stability/Clinical Decision Making Stable/Uncomplicated    Rehab Potential Good    PT Frequency 2x / week    PT Duration 8 weeks    PT Treatment/Interventions ADLs/Self Care Home Management;Cryotherapy;Traction;Functional mobility training;Therapeutic activities;Neuromuscular re-education;Therapeutic exercise;Patient/family education;Manual techniques;Dry needling    PT Next Visit Plan Low back, hip abductors, lower extremity strength work and body mechanics    PT Home Exercise Plan Access Code: [email or web address]    Consulted and Agree with Plan of Care Patient             Patient will benefit from skilled therapeutic intervention in order to improve the following deficits and impairments:  Decreased activity tolerance, Decreased endurance, Decreased range of motion, Difficulty walking, Decreased strength, Increased edema, Increased muscle spasms, Impaired flexibility, Improper body mechanics, Postural dysfunction, Pain  Visit Diagnosis: Abnormal posture  Difficulty in walking, not elsewhere classified  Muscle weakness (generalized)     Problem List Patient Active Problem List   Diagnosis Date Noted   Coronary artery calcification 05/30/2020   Hyponatremia 03/08/2020   UTI (urinary tract infection) 03/08/2020   Microscopic colitis    Hypokalemia    Renal insufficiency    Combined abdominal pain, vomiting, and diarrhea    Hypertension    Rheumatoid arthritis ([place])    Obstructive sleep apnea 04/25/2018   Lung nodule < 6cm on CT 04/25/2018   Cough 04/25/2018    Farley Ly, PT, MPT 08/09/2021, 12:01 PM  [facility] [address] [place], [place], [address] Phone: [phone]   Fax:  [phone]  Name: Loretta Austin MRN:  [id number] Date of Birth: 1946/05/10

## 2021-08-13 ENCOUNTER — Encounter: Payer: Self-pay | Admitting: Physical Therapy

## 2021-08-13 ENCOUNTER — Ambulatory Visit (INDEPENDENT_AMBULATORY_CARE_PROVIDER_SITE_OTHER): Payer: Medicare Other | Admitting: Physical Therapy

## 2021-08-13 ENCOUNTER — Other Ambulatory Visit: Payer: Self-pay

## 2021-08-13 DIAGNOSIS — R293 Abnormal posture: Secondary | ICD-10-CM | POA: Diagnosis not present

## 2021-08-13 DIAGNOSIS — M6281 Muscle weakness (generalized): Secondary | ICD-10-CM | POA: Diagnosis not present

## 2021-08-13 DIAGNOSIS — R262 Difficulty in walking, not elsewhere classified: Secondary | ICD-10-CM | POA: Diagnosis not present

## 2021-08-13 NOTE — Therapy (Signed)
[facility] [address] [place], [place], [address] Phone: [phone]   Fax:  [phone]  Physical Therapy Treatment  Patient Details  Name: Loretta Austin MRN: [id number] Date of Birth: 08-11-1946 Referring Provider (PT): Leandrew Koyanagi MD   Encounter Date: 08/13/2021   PT End of Session - 08/13/21 1141     Visit Number 6    Number of Visits 16    Date for PT Re-Evaluation 09/20/21    PT Start Time 1100    PT Stop Time 1139    PT Time Calculation (min) 39 min    Activity Tolerance Patient tolerated treatment well;No increased pain    Behavior During Therapy WFL for tasks assessed/performed             Past Medical History:  Diagnosis Date   Arthritis    Cholecystolithiasis    Colon polyps    Diverticulosis    Food poisoning    GERD (gastroesophageal reflux disease)    High blood pressure    Microscopic colitis    Sjogren's disease ([place])    Sleep apnea     Past Surgical History:  Procedure Laterality Date   COLONOSCOPY  04/19/2021   02/12/16-at [place]   TONSILLECTOMY AND ADENOIDECTOMY     TUBAL LIGATION      There were no vitals filed for this visit.   Subjective Assessment - 08/13/21 1058     Subjective tired today. leg press wore her out.    Pertinent History HTN, rheumatoid and osteoarthritis    Limitations Sitting;Walking;Lifting;House hold activities;Standing    How long can you sit comfortably? Encouraged to change position frequently    How long can you stand comfortably? < 10 minutes    How long can you walk comfortably? Short walks with the dog    Patient Stated Goals Be able to walk for long periods of time to travel    Currently in Pain? No/denies   little pain in knee - acute   Pain Onset --                               [facility] Adult PT Treatment/Exercise - 08/13/21 1103       Lumbar Exercises: Aerobic   Nustep L5 x 8 min      Lumbar Exercises: Machines for Strengthening    Leg Press 81# 2x10      Lumbar Exercises: Standing   Other Standing Lumbar Exercises RDL 5# KB 2x10    Other Standing Lumbar Exercises hip abdct and ext 2 x 10 bil; 3#      Lumbar Exercises: Supine   Bridge 20 reps;5 seconds                     PT Education - 08/13/21 1140     Education Details discussed PT POC and goals - discussed transition to gym program and when d/c from PT is appropriate    Person(s) Educated Patient    Methods Explanation    Comprehension Verbalized understanding              PT Short Term Goals - 08/13/21 1141       PT SHORT TERM GOAL #1   Title [name] will improve B ankle dorsiflexion to 10 degrees.    Baseline 2-7 degrees    Time 4    Period Weeks    Status On-going    Target Date  08/22/21      PT SHORT TERM GOAL #2   Title Improve B hip ER AROM to 40 degrees.    Baseline 14-18 degrees    Time 4    Period Weeks    Status On-going    Target Date 08/22/21               PT Long Term Goals - 07/25/21 1727       PT LONG TERM GOAL #1   Title Improve FOTO score to 64.    Baseline 53    Time 8    Period Weeks    Status New    Target Date 09/20/21      PT LONG TERM GOAL #2   Title [name] will be able to stand for > 1 hour and walk for 2+ miles with no increase in low back and R hip pain.    Baseline 5-10 minutes    Time 8    Period Weeks    Status New    Target Date 09/20/21      PT LONG TERM GOAL #3   Title [name] will have improved low back and B leg strength as assessed by objective measures and functional self-report.    Baseline Postural weakness and hip abductors weakness noted at examination.    Time 8    Period Weeks    Status New    Target Date 09/20/21      PT LONG TERM GOAL #4   Title [name] will be independent with her long-term HEP at DC.    Baseline Assigned today    Time 8    Period Weeks    Status New    Target Date 09/20/21                   Plan - 08/13/21 1141     Clinical  Impression Statement Pt continues to report minimal pain at this time, but has not progressed activity much past current activity.  She is concerned about walking 5-6 miles, so discussed need to increase activity slowly and start now rather than wait until her trip in April.  Also discussed need for continued strengthening so that she is better prepared for trips.  Will continue to benefit from PT to maximize function.    Personal Factors and Comorbidities Comorbidity 2    Comorbidities HTN and arthritis (osteo and rheumatoid)    Examination-Activity Limitations Stairs;Stand;Bed Mobility;Bend;Lift;Carry;Locomotion Level;Squat    Examination-Participation Restrictions Interpersonal Relationship;Community Activity    Stability/Clinical Decision Making Stable/Uncomplicated    Rehab Potential Good    PT Frequency 2x / week    PT Duration 8 weeks    PT Treatment/Interventions ADLs/Self Care Home Management;Cryotherapy;Traction;Functional mobility training;Therapeutic activities;Neuromuscular re-education;Therapeutic exercise;Patient/family education;Manual techniques;Dry needling    PT Next Visit Plan Low back, hip abductors, lower extremity strength work and Economist; discuss gym program (pt to go to neighborhood gym prior to next session)    PT Home Exercise Plan Access Code: [email or web address]    Consulted and Agree with Plan of Care Patient             Patient will benefit from skilled therapeutic intervention in order to improve the following deficits and impairments:  Decreased activity tolerance, Decreased endurance, Decreased range of motion, Difficulty walking, Decreased strength, Increased edema, Increased muscle spasms, Impaired flexibility, Improper body mechanics, Postural dysfunction, Pain  Visit Diagnosis: Abnormal posture  Difficulty in walking, not elsewhere classified  Muscle weakness (generalized)  Problem List Patient Active Problem List   Diagnosis Date Noted    Coronary artery calcification 05/30/2020   Hyponatremia 03/08/2020   UTI (urinary tract infection) 03/08/2020   Microscopic colitis    Hypokalemia    Renal insufficiency    Combined abdominal pain, vomiting, and diarrhea    Hypertension    Rheumatoid arthritis ([place])    Obstructive sleep apnea 04/25/2018   Lung nodule < 6cm on CT 04/25/2018   Cough 04/25/2018      Laureen Abrahams, PT, DPT 08/13/21 11:45 AM     [facility] [address] [place], [place], [address] Phone: [phone]   Fax:  [phone]  Name: Madelline Eshbach MRN: [id number] Date of Birth: 01/14/1946

## 2021-08-20 ENCOUNTER — Encounter: Payer: Medicare Other | Admitting: Physical Therapy

## 2021-08-22 ENCOUNTER — Ambulatory Visit (INDEPENDENT_AMBULATORY_CARE_PROVIDER_SITE_OTHER): Payer: Medicare Other | Admitting: Rehabilitative and Restorative Service Providers"

## 2021-08-22 ENCOUNTER — Encounter: Payer: Self-pay | Admitting: Rehabilitative and Restorative Service Providers"

## 2021-08-22 ENCOUNTER — Other Ambulatory Visit: Payer: Self-pay

## 2021-08-22 DIAGNOSIS — R600 Localized edema: Secondary | ICD-10-CM

## 2021-08-22 DIAGNOSIS — M6281 Muscle weakness (generalized): Secondary | ICD-10-CM | POA: Diagnosis not present

## 2021-08-22 DIAGNOSIS — M545 Low back pain, unspecified: Secondary | ICD-10-CM

## 2021-08-22 DIAGNOSIS — G8929 Other chronic pain: Secondary | ICD-10-CM

## 2021-08-22 DIAGNOSIS — R293 Abnormal posture: Secondary | ICD-10-CM

## 2021-08-22 DIAGNOSIS — R262 Difficulty in walking, not elsewhere classified: Secondary | ICD-10-CM

## 2021-08-22 NOTE — Patient Instructions (Signed)
Access Code: [email or web address] URL: [email or web address] Date: 08/22/2021 Prepared by: Vista Mink  Exercises Standing Scapular Retraction - 5 x daily - 7 x weekly - 1 sets - 5 reps - 5 second hold Standing Lumbar Extension at [place] - 5 x daily - 7 x weekly - 1 sets - 5 reps - 3 seconds hold Supine Figure 4 Piriformis Stretch - 1-2 x daily - 7 x weekly - 1 sets - 5 reps - 20 seconds hold Standing Hip Hiking - 1-2 x daily - 7 x weekly - 2 sets - 5 reps - 3 seconds hold Prone Hip Extension - 1 x daily - 7 x weekly - 1-2 sets - 10 reps - 3 seconds hold

## 2021-08-22 NOTE — Therapy (Signed)
[facility] [address] [place], [place], [address] Phone: [phone]   Fax:  [phone]  Physical Therapy Treatment/Discharge  Patient Details  Name: Loretta Austin MRN: [id number] Date of Birth: [birth year] Referring Provider (PT): Leandrew Koyanagi MD  PHYSICAL THERAPY DISCHARGE SUMMARY  Visits from Start of Care: 7  Current functional level related to goals / functional outcomes: See note   Remaining deficits: See note   Education / Equipment: Updated HEP   Patient agrees to discharge. Patient goals were met. Patient is being discharged due to being pleased with the current functional level.   Encounter Date: 08/22/2021   PT End of Session - 08/22/21 1314     Visit Number 7    Number of Visits 16    Date for PT Re-Evaluation 09/20/21    PT Start Time 4142    PT Stop Time 1059    PT Time Calculation (min) 44 min    Activity Tolerance Patient tolerated treatment well;No increased pain    Behavior During Therapy WFL for tasks assessed/performed             Past Medical History:  Diagnosis Date   Arthritis    Cholecystolithiasis    Colon polyps    Diverticulosis    Food poisoning    GERD (gastroesophageal reflux disease)    High blood pressure    Microscopic colitis    Sjogren's disease ([place])    Sleep apnea     Past Surgical History:  Procedure Laterality Date   COLONOSCOPY  04/19/2021   02/12/16-at [place]   TONSILLECTOMY AND ADENOIDECTOMY     TUBAL LIGATION      There were no vitals filed for this visit.   Subjective Assessment - 08/22/21 1031     Subjective [name] is feeling good.  We will RA and assess her readiness for independent PT.    Pertinent History HTN, rheumatoid and osteoarthritis    Limitations Sitting;Walking;Lifting;House hold activities;Standing    How long can you sit comfortably? Encouraged to change position frequently    How long can you stand comfortably? 30+ minutes (was < 10)    How  long can you walk comfortably? Short walks with the dog and walking on the treadmill    Patient Stated Goals Be able to walk for long periods of time to travel    Currently in Pain? No/denies    Pain Score 0-No pain    Pain Location Hip    Pain Orientation Right    Pain Descriptors / Indicators Aching    Pain Type Chronic pain    Pain Radiating Towards Gluteal    Pain Onset More than a month ago    Pain Frequency Occasional    Aggravating Factors  Prolonged postures    Pain Relieving Factors Exercise, correcting posture and/or body mechanics and change of position    Effect of Pain on Daily Activities Doing well with mechanics and exercises    Multiple Pain Sites No                [facility] PT Assessment - 08/22/21 0001       AROM   Right Hip Flexion 105    Right Hip External Rotation  23    Right Hip Internal Rotation  10    Left Hip Flexion 110    Left Hip External Rotation  23    Left Hip Internal Rotation  20    Right Ankle Dorsiflexion 4  Left Ankle Dorsiflexion 4    Lumbar Extension 20      Flexibility   Soft Tissue Assessment /Muscle Length yes    Hamstrings 50 degrees/60 degrees                           [facility] Adult PT Treatment/Exercise - 08/22/21 0001       Therapeutic Activites    Therapeutic Activities ADL's;Lifting;Other Therapeutic Activities    ADL's Review of feeding and watering the dog mechanics    Lifting Golfer's and diagonal squat lift    Other Therapeutic Activities Review of RA findings, HEP and review of appropriate gym exercises (leg press and treadmill)      Exercises   Exercises Lumbar      Lumbar Exercises: Stretches   Figure 4 Stretch 4 reps;20 seconds    Other Lumbar Stretch Exercise Trunk extension AROM (hips forward) 10X 3 seconds      Lumbar Exercises: Machines for Strengthening   Leg Press --      Lumbar Exercises: Standing   Scapular Retraction Strengthening;Both;10 reps;Limitations    Scapular Retraction  Limitations 5 seconds (shoulder blade pinches)    Other Standing Lumbar Exercises Alternating hip hike Hip hiking in doorframe with shoulder against wall 3 sets of  5 each side (R quadratus lumborum is weakest)      Lumbar Exercises: Prone   Straight Leg Raise 10 reps;3 seconds                     PT Education - 08/22/21 1311     Education Details Reviewed practical mechanics, HEP, appropriate gym exercises and RA findings.  Provided updated HEP.    Person(s) Educated Patient    Methods Explanation;Demonstration;Verbal cues    Comprehension Verbalized understanding;Returned demonstration;Verbal cues required              PT Short Term Goals - 08/22/21 1312       PT SHORT TERM GOAL #1   Title [name] will improve B ankle dorsiflexion to 10 degrees.    Baseline 4 degrees    Time 4    Period Weeks    Status On-going    Target Date 08/22/21      PT SHORT TERM GOAL #2   Title Improve B hip ER AROM to 40 degrees.    Baseline 14-18 degrees at evaluation    Time 4    Period Weeks    Status On-going    Target Date 08/22/21               PT Long Term Goals - 08/22/21 1313       PT LONG TERM GOAL #1   Title Improve FOTO score to 64.    Baseline 65 (was 53)    Time 8    Period Weeks    Status Achieved      PT LONG TERM GOAL #2   Title [name] will be able to stand for > 1 hour and walk for 2+ miles with no increase in low back and R hip pain.    Baseline Improved, has not tried longer walks    Time 8    Period Weeks    Status Partially Met      PT LONG TERM GOAL #3   Title [name] will have improved low back and B leg strength as assessed by objective measures and functional self-report.    Baseline Postural weakness and  hip abductors weakness noted at examination.    Time 8    Period Weeks    Status Achieved      PT LONG TERM GOAL #4   Title [name] will be independent with her long-term HEP at DC.    Baseline Assigned today    Time 8    Period Weeks     Status Achieved                   Plan - 08/22/21 1314     Clinical Impression Statement [name] has made subjective, objective and functional progress with her supervised PT.  With improved HEP compliance, her remaining objective impairments should meet established goals.  Improved posture and body mechanics have allowed [name] to meet functional goals, and she was encouraged to get on a frequent schedule with her updated HEP for the best long-term outcome.  She is comfortable with this plan and requested transfer into independent rehabilitation.    Personal Factors and Comorbidities Comorbidity 2    Comorbidities HTN and arthritis (osteo and rheumatoid)    Examination-Activity Limitations Stairs;Stand;Bed Mobility;Bend;Lift;Carry;Locomotion Level;Squat    Examination-Participation Restrictions Interpersonal Relationship;Community Activity    Stability/Clinical Decision Making Stable/Uncomplicated    Rehab Potential Good    PT Frequency Other (comment)   DC   PT Duration Other (comment)   DC   PT Treatment/Interventions ADLs/Self Care Home Management;Cryotherapy;Traction;Functional mobility training;Therapeutic activities;Neuromuscular re-education;Therapeutic exercise;Patient/family education;Manual techniques;Dry needling    PT Next Visit Plan DC    PT Home Exercise Plan Access Code: [facility]    Consulted and Agree with Plan of Care Patient             Patient will benefit from skilled therapeutic intervention in order to improve the following deficits and impairments:  Decreased activity tolerance, Decreased endurance, Decreased range of motion, Difficulty walking, Decreased strength, Increased edema, Increased muscle spasms, Impaired flexibility, Improper body mechanics, Postural dysfunction, Pain  Visit Diagnosis: Abnormal posture  Difficulty in walking, not elsewhere classified  Muscle weakness (generalized)  Localized edema  Chronic right-sided low back pain,  unspecified whether sciatica present     Problem List Patient Active Problem List   Diagnosis Date Noted   Coronary artery calcification 05/30/2020   Hyponatremia 03/08/2020   UTI (urinary tract infection) 03/08/2020   Microscopic colitis    Hypokalemia    Renal insufficiency    Combined abdominal pain, vomiting, and diarrhea    Hypertension    Rheumatoid arthritis ([place])    Obstructive sleep apnea 04/25/2018   Lung nodule < 6cm on CT 04/25/2018   Cough 04/25/2018    Farley Ly, PT, MPT 08/22/2021, 1:19 PM  [facility] [address] [place], [place], [address] Phone: [phone]   Fax:  [phone]  Name: Donn Wilmot MRN: [id number] Date of Birth: 1946-03-21

## 2021-08-27 ENCOUNTER — Encounter: Payer: Medicare Other | Admitting: Physical Therapy

## 2021-08-28 DIAGNOSIS — N390 Urinary tract infection, site not specified: Secondary | ICD-10-CM | POA: Diagnosis not present

## 2021-08-28 DIAGNOSIS — R350 Frequency of micturition: Secondary | ICD-10-CM | POA: Diagnosis not present

## 2021-08-29 ENCOUNTER — Encounter: Payer: Medicare Other | Admitting: Rehabilitative and Restorative Service Providers"

## 2021-08-30 ENCOUNTER — Encounter: Payer: Medicare Other | Admitting: Rehabilitative and Restorative Service Providers"

## 2021-09-11 DIAGNOSIS — Z1231 Encounter for screening mammogram for malignant neoplasm of breast: Secondary | ICD-10-CM | POA: Diagnosis not present

## 2021-10-07 DIAGNOSIS — L570 Actinic keratosis: Secondary | ICD-10-CM | POA: Diagnosis not present

## 2021-10-07 DIAGNOSIS — L820 Inflamed seborrheic keratosis: Secondary | ICD-10-CM | POA: Diagnosis not present

## 2021-10-07 DIAGNOSIS — L821 Other seborrheic keratosis: Secondary | ICD-10-CM | POA: Diagnosis not present

## 2021-10-08 ENCOUNTER — Encounter: Payer: Self-pay | Admitting: Gastroenterology

## 2021-10-14 ENCOUNTER — Telehealth: Payer: Self-pay | Admitting: Gastroenterology

## 2021-10-14 DIAGNOSIS — R739 Hyperglycemia, unspecified: Secondary | ICD-10-CM | POA: Diagnosis not present

## 2021-10-14 DIAGNOSIS — E559 Vitamin D deficiency, unspecified: Secondary | ICD-10-CM | POA: Diagnosis not present

## 2021-10-14 DIAGNOSIS — E7800 Pure hypercholesterolemia, unspecified: Secondary | ICD-10-CM | POA: Diagnosis not present

## 2021-10-14 DIAGNOSIS — I10 Essential (primary) hypertension: Secondary | ICD-10-CM | POA: Diagnosis not present

## 2021-10-14 NOTE — Telephone Encounter (Signed)
Yes, it can be refilled at the current dose, and I would also like to see her for office follow-up sometime in March.  HD

## 2021-10-14 NOTE — Telephone Encounter (Signed)
Inbound call from patient, stated that she is in need of a refill on her budesonide. Please advise.

## 2021-10-14 NOTE — Telephone Encounter (Signed)
Budesonide refill request. I called and spoke to the patient, she states she is taking budesonide 3mg  once a day. Ok to continue refills. Please review.

## 2021-10-15 MED ORDER — BUDESONIDE 3 MG PO CPEP: ORAL_CAPSULE | ORAL | 2 refills | Status: DC

## 2021-10-15 NOTE — Telephone Encounter (Signed)
Follow up is scheduled for 11-21-21. Refills have been given.

## 2021-10-16 DIAGNOSIS — E559 Vitamin D deficiency, unspecified: Secondary | ICD-10-CM | POA: Diagnosis not present

## 2021-10-16 DIAGNOSIS — I10 Essential (primary) hypertension: Secondary | ICD-10-CM | POA: Diagnosis not present

## 2021-10-16 NOTE — Telephone Encounter (Signed)
Patient called states she wanted her script from [facility] however she did pick it up. Requested her refills and moving forward for it to come from [facility].

## 2021-10-17 DIAGNOSIS — R82998 Other abnormal findings in urine: Secondary | ICD-10-CM | POA: Diagnosis not present

## 2021-10-17 DIAGNOSIS — Z1212 Encounter for screening for malignant neoplasm of rectum: Secondary | ICD-10-CM | POA: Diagnosis not present

## 2021-10-21 DIAGNOSIS — G8929 Other chronic pain: Secondary | ICD-10-CM | POA: Diagnosis not present

## 2021-10-21 DIAGNOSIS — M069 Rheumatoid arthritis, unspecified: Secondary | ICD-10-CM | POA: Diagnosis not present

## 2021-10-21 DIAGNOSIS — K219 Gastro-esophageal reflux disease without esophagitis: Secondary | ICD-10-CM | POA: Diagnosis not present

## 2021-10-21 DIAGNOSIS — I10 Essential (primary) hypertension: Secondary | ICD-10-CM | POA: Diagnosis not present

## 2021-10-21 DIAGNOSIS — R739 Hyperglycemia, unspecified: Secondary | ICD-10-CM | POA: Diagnosis not present

## 2021-10-21 DIAGNOSIS — I700 Atherosclerosis of aorta: Secondary | ICD-10-CM | POA: Diagnosis not present

## 2021-10-21 DIAGNOSIS — E7800 Pure hypercholesterolemia, unspecified: Secondary | ICD-10-CM | POA: Diagnosis not present

## 2021-10-21 DIAGNOSIS — M545 Low back pain, unspecified: Secondary | ICD-10-CM | POA: Diagnosis not present

## 2021-10-21 DIAGNOSIS — Z0000 Encounter for general adult medical examination without abnormal findings: Secondary | ICD-10-CM | POA: Diagnosis not present

## 2021-10-21 DIAGNOSIS — E559 Vitamin D deficiency, unspecified: Secondary | ICD-10-CM | POA: Diagnosis not present

## 2021-10-21 DIAGNOSIS — G4733 Obstructive sleep apnea (adult) (pediatric): Secondary | ICD-10-CM | POA: Diagnosis not present

## 2021-10-21 DIAGNOSIS — H04123 Dry eye syndrome of bilateral lacrimal glands: Secondary | ICD-10-CM | POA: Diagnosis not present

## 2021-10-31 DIAGNOSIS — Z124 Encounter for screening for malignant neoplasm of cervix: Secondary | ICD-10-CM | POA: Diagnosis not present

## 2021-10-31 DIAGNOSIS — Z1151 Encounter for screening for human papillomavirus (HPV): Secondary | ICD-10-CM | POA: Diagnosis not present

## 2021-10-31 DIAGNOSIS — Z01419 Encounter for gynecological examination (general) (routine) without abnormal findings: Secondary | ICD-10-CM | POA: Diagnosis not present

## 2021-10-31 LAB — HM PAP SMEAR: HPV Aptima: NEGATIVE

## 2021-11-12 DIAGNOSIS — M0609 Rheumatoid arthritis without rheumatoid factor, multiple sites: Secondary | ICD-10-CM | POA: Diagnosis not present

## 2021-11-12 DIAGNOSIS — M150 Primary generalized (osteo)arthritis: Secondary | ICD-10-CM | POA: Diagnosis not present

## 2021-11-12 DIAGNOSIS — M3500 Sicca syndrome, unspecified: Secondary | ICD-10-CM | POA: Diagnosis not present

## 2021-11-12 DIAGNOSIS — Z6829 Body mass index (BMI) 29.0-29.9, adult: Secondary | ICD-10-CM | POA: Diagnosis not present

## 2021-11-12 DIAGNOSIS — Z79899 Other long term (current) drug therapy: Secondary | ICD-10-CM | POA: Diagnosis not present

## 2021-11-12 DIAGNOSIS — E663 Overweight: Secondary | ICD-10-CM | POA: Diagnosis not present

## 2021-11-21 ENCOUNTER — Encounter: Payer: Self-pay | Admitting: Gastroenterology

## 2021-11-21 ENCOUNTER — Ambulatory Visit (INDEPENDENT_AMBULATORY_CARE_PROVIDER_SITE_OTHER): Payer: Medicare Other | Admitting: Gastroenterology

## 2021-11-21 VITALS — BP 128/68 | HR 87 | Ht 65.0 in | Wt 175.0 lb

## 2021-11-21 DIAGNOSIS — K52831 Collagenous colitis: Secondary | ICD-10-CM | POA: Diagnosis not present

## 2021-11-21 DIAGNOSIS — R1314 Dysphagia, pharyngoesophageal phase: Secondary | ICD-10-CM | POA: Diagnosis not present

## 2021-11-21 NOTE — Patient Instructions (Signed)
If you are age 75 or older, your body mass index should be between 23-30. Your Body mass index is 29.12 kg/m?Marland Kitchen If this is out of the aforementioned range listed, please consider follow up with your Primary Care Provider. ? ?If you are age 65 or younger, your body mass index should be between 19-25. Your Body mass index is 29.12 kg/m?Marland Kitchen If this is out of the aformentioned range listed, please consider follow up with your Primary Care Provider.  ? ?________________________________________________________ ? ?The [place] [facility] providers would like to encourage you to use [facility] to communicate with providers for non-urgent requests or questions.  Due to long hold times on the telephone, sending your provider a message by [facility] may be a faster and more efficient way to get a response.  Please allow 48 business hours for a response.  Please remember that this is for non-urgent requests.  ?_______________________________________________________ ? ?You have been scheduled for a Barium Esophogram at [facility] (1st floor of the hospital) on             at                     . Please arrive 15 minutes prior to your appointment for registration. Make certain not to have anything to eat or drink 3 hours prior to your test. If you need to reschedule for any reason, please contact radiology at [phone] to do so. ?__________________________________________________________________ ?A barium swallow is an examination that concentrates on views of the esophagus. This tends to be a double contrast exam (barium and two liquids which, when combined, create a gas to distend the wall of the oesophagus) or single contrast (non-ionic iodine based). The study is usually tailored to your symptoms so a good history is essential. Attention is paid during the study to the form, structure and configuration of the esophagus, looking for functional disorders (such as aspiration, dysphagia, achalasia, motility and  reflux) ?EXAMINATION ?You may be asked to change into a gown, depending on the type of swallow being performed. A radiologist and radiographer will perform the procedure. The radiologist will advise you of the ?type of contrast selected for your procedure and direct you during the exam. You will be asked to stand, sit or lie in several different positions and to hold a small amount of fluid in your mouth before being asked to swallow while the imaging is performed .In some instances you may be asked to swallow barium coated marshmallows to assess the motility of a solid food bolus. ?The exam can be recorded as a digital or video fluoroscopy procedure. ?POST PROCEDURE ?It will take 1-2 days for the barium to pass through your system. To facilitate this, it is important, unless otherwise directed, to increase your fluids for the next 24-48hrs and to resume your normal diet.  ?This test typically takes about 30 minutes to perform. ?__________________________________________________________________________________ ? ? ?It was a pleasure to see you today! ? ?Thank you for trusting me with your gastrointestinal care!   ? ? ?

## 2021-11-21 NOTE — Progress Notes (Signed)
? ? ? ?[facility] Progress Note ? ?Chief Complaint: Collagenous colitis ? ?Subjective  ?History: ?Loretta Austin follows up for her collagenous colitis, has been seeing me since 2018 when [place].  She is maintained on budesonide daily and generally done well with that.  Last screening colonoscopy for family history of colon cancer (mother) July 2022, 6 adenomatous polyps removed, 3-year recall recommended.  Biopsies at that time showed persistent collagenous colitis. ? ?Loretta Austin is doing well overall on 3 mg a day of budesonide.  She typically has 4-6 soft BMs per day, but they are not loose and this has been her usual pattern for years. ?She denies abdominal pain rectal bleeding or weight loss. ?For the last year or 2 she has had intermittent dysphagia where pills or food feel really stuck in the neck.  Many years ago she had reflux symptoms regularly, but was able to get off PPI and has not had recurrence of pyrosis or regurgitation.  She is a former smoker, denies cough, hemoptysis, altered vocal quality ?Looking forward to an upcoming 3-week trip to [place] with family. ?ROS: ?Cardiovascular:  no chest pain ?Respiratory: no dyspnea ? ?The patient's Past Medical, Family and Social History were reviewed and are on file in the EMR. ? ?Objective: ? ?Med list reviewed ? ?Current Outpatient Medications:  ?  AMBULATORY NON FORMULARY MEDICATION, CBD tablets Take one tablet by mouth once daily, Disp: , Rfl:  ?  amLODipine (NORVASC) 5 MG tablet, Take 5 mg by mouth daily., Disp: , Rfl:  ?  Bacillus Coagulans-Inulin (ALIGN PREBIOTIC-PROBIOTIC) 5-1.25 MG-GM CHEW, See admin instructions., Disp: , Rfl:  ?  Biotin 10000 MCG TABS, Take 1 tablet by mouth daily., Disp: , Rfl:  ?  budesonide (ENTOCORT EC) 3 MG 24 hr capsule, Take as directed., Disp: 140 capsule, Rfl: 2 ?  calcium citrate-vitamin D 500-500 MG-UNIT chewable tablet, Chew 1 tablet by mouth daily. , Disp: , Rfl:  ?   Cyanocobalamin (VITAMIN B-12 PO), Take 2.5 mg by mouth daily., Disp: , Rfl:  ?  folic acid (FOLVITE) 462 MCG tablet, Take 800 mcg by mouth daily., Disp: , Rfl:  ?  HUMIRA 40 MG/0.4ML PSKT, Inject 1 Syringe as directed every 14 (fourteen) days., Disp: , Rfl:  ?  ipratropium (ATROVENT) 0.03 % nasal spray, Place 2 sprays into both nostrils as needed. , Disp: , Rfl:  ?  loratadine (CLARITIN) 10 MG tablet, Take 10 mg by mouth in the morning and at bedtime. , Disp: , Rfl:  ?  losartan (COZAAR) 100 MG tablet, 1 tablet, Disp: , Rfl:  ?  Magnesium 400 MG TABS, Take 1 tablet by mouth daily in the afternoon., Disp: , Rfl:  ?  Melatonin 10 MG TABS, Take 10 mg by mouth daily., Disp: , Rfl:  ?  Multiple Vitamins-Iron (MULTIVITAMIN/IRON PO), Take 1 tablet by mouth daily., Disp: , Rfl:  ?  nitrofurantoin (MACRODANTIN) 100 MG capsule, Take 100 mg by mouth at bedtime., Disp: , Rfl:  ?  RESTASIS 0.05 % ophthalmic emulsion, Place 1 drop into both eyes 2 (two) times daily. , Disp: , Rfl:  ?  rosuvastatin (CRESTOR) 10 MG tablet, Take 1 tablet (10 mg total) by mouth daily., Disp: 90 tablet, Rfl: 3 ?  meloxicam (MOBIC) 7.5 MG tablet, Take 1 tablet (7.5 mg total) by mouth 2 (two) times daily as needed for pain., Disp: 30 tablet, Rfl: 2 ?  predniSONE (STERAPRED UNI-PAK 21 TAB) 10  MG (21) TBPK tablet, Take as directed, Disp: 21 tablet, Rfl: 3 ?  zaleplon (SONATA) 5 MG capsule, Take 5 mg by mouth at bedtime as needed for sleep., Disp: , Rfl:  ? ?Current Facility-Administered Medications:  ?  0.9 %  sodium chloride infusion, 500 mL, Intravenous, Once, Danis, Kirke Corin, MD ? ? ?Vital signs in last 24 hrs: ?Vitals:  ? 11/21/21 1518  ?BP: 128/68  ?Pulse: 87  ?SpO2: 97%  ? ?Wt Readings from Last 3 Encounters:  ?11/21/21 175 lb (79.4 kg)  ?04/15/21 177 lb (80.3 kg)  ?12/13/20 170 lb (77.1 kg)  ?  ?Physical Exam ? ?Well-appearing, normal vocal quality ?HEENT: sclera anicteric, oral mucosa moist without lesions ?Neck: supple, no thyromegaly, JVD or  lymphadenopathy ?Cardiac: RRR without murmurs, S1S2 heard, no peripheral edema ?Pulm: clear to auscultation bilaterally, normal RR and effort noted ?Abdomen: soft, no tenderness, with active bowel sounds. No guarding or palpable hepatosplenomegaly. ? ? ?Labs: ? ? ?___________________________________________ ?Radiologic studies: ? ? ?____________________________________________ ?Other: ? ? ?_____________________________________________ ?Assessment & Plan  ?Assessment: ?Encounter Diagnoses  ?Name Primary?  ? Pharyngoesophageal dysphagia Yes  ? Collagenous colitis   ? ?Stable collagenous colitis, continue budesonide 3 mg once daily indefinitely.  If she has episodes of watery diarrhea, we we will probably give her a brief course of a higher dose. ? ?Pharyngeal esophageal dysphagia, sounds most like cricopharyngeal dysfunction.  No previous cervical spine or neck surgery. ?Barium study with liquid and tablet ordered. ? ?If she has worsening symptoms, and certainly if cough, hemoptysis or altered vocal quality, ENT evaluation for an office fiberoptic exam in a former smoker. ? ? ?Nelida Meuse III ? ?

## 2021-12-05 ENCOUNTER — Other Ambulatory Visit: Payer: Self-pay

## 2021-12-05 ENCOUNTER — Ambulatory Visit (HOSPITAL_COMMUNITY)
Admission: RE | Admit: 2021-12-05 | Discharge: 2021-12-05 | Disposition: A | Discharge status: Home / Self Care | Payer: Medicare Other | Source: Ambulatory Visit | Attending: Gastroenterology | Admitting: Gastroenterology

## 2021-12-05 DIAGNOSIS — R1314 Dysphagia, pharyngoesophageal phase: Secondary | ICD-10-CM | POA: Diagnosis not present

## 2021-12-05 DIAGNOSIS — K219 Gastro-esophageal reflux disease without esophagitis: Secondary | ICD-10-CM | POA: Diagnosis not present

## 2021-12-17 DIAGNOSIS — Z20822 Contact with and (suspected) exposure to covid-19: Secondary | ICD-10-CM | POA: Diagnosis not present

## 2021-12-24 ENCOUNTER — Encounter: Payer: Self-pay | Admitting: Cardiovascular Disease

## 2021-12-24 ENCOUNTER — Ambulatory Visit (INDEPENDENT_AMBULATORY_CARE_PROVIDER_SITE_OTHER): Payer: Medicare Other | Admitting: Cardiovascular Disease

## 2021-12-24 VITALS — BP 138/72 | HR 64 | Ht 65.0 in | Wt 176.2 lb

## 2021-12-24 DIAGNOSIS — I2510 Atherosclerotic heart disease of native coronary artery without angina pectoris: Secondary | ICD-10-CM | POA: Diagnosis not present

## 2021-12-24 DIAGNOSIS — E782 Mixed hyperlipidemia: Secondary | ICD-10-CM | POA: Diagnosis not present

## 2021-12-24 DIAGNOSIS — I2584 Coronary atherosclerosis due to calcified coronary lesion: Secondary | ICD-10-CM

## 2021-12-24 NOTE — Patient Instructions (Signed)
Medication Instructions:  ?Your physician recommends that you continue on your current medications as directed. Please refer to the Current Medication list given to you today. ? ?*If you need a refill on your cardiac medications before your next appointment, please call your pharmacy* ? ?Lab Work: ?NONE ?If you have labs (blood work) drawn today and your tests are completely normal, you will receive your results only by: ?MyChart Message (if you have MyChart) OR ?A paper copy in the mail ?If you have any lab test that is abnormal or we need to change your treatment, we will call you to review the results. ? ?Testing/Procedures: ?NONE ? ?Follow-Up: ?At [facility], you and your health needs are our priority.  As part of our continuing mission to provide you with exceptional heart care, we have created designated Provider Care Teams.  These Care Teams include your primary Cardiologist (physician) and Advanced Practice Providers (APPs -  Physician Assistants and Nurse Practitioners) who all work together to provide you with the care you need, when you need it. ? ?Your next appointment:   ?1 year(s) ? ?The format for your next appointment:   ?In Person ? ?Provider:   ?Robbie Lis, PA-C or Richardson Dopp, PA-C  ?

## 2021-12-24 NOTE — Progress Notes (Signed)
?Cardiology Office Note:   ? ?Date:  12/24/2021  ? ?ID:  Loretta Austin, DOB [birth year], MRN [id number] ? ?PCP:  Donnajean Lopes, MD  ?[facility] Cardiologist:  Dorn Hartshorne  ?[place] Electrophysiologist:  None  ? ?Referring MD: Donnajean Lopes, MD  ? ?Chief Complaint  ?Patient presents with  ? Hypertension  ? Shortness of Breath  ?   ?  ?  ? ?History of Present Illness:   ? ?Loretta Austin is a 76 y.o. female with a hx of HTN.  We were asked to see her by Dr. Keturah Barre for further evaluation of her coronary artery calcifications. ? ?Has a strong family hx  ?Father has CAD ?Brother has stents ?Mother has AAA . ? ?She is moderately active.   Water aerobic 3 times a day ?Walks her dog but no real exercise ? ?No angina .  ?No syncope , no presyncope  ? ?Labs from [employer] shows ?Chol = 239 ?HDL = 67 ?LDL = 148 ?Trigs = 119  ? ?Has been on Losartan + HCTZ but was found to be very hyponatremic ( was admitted with food poisoning)  ? ?Also had acute / chronic kidney disease -  Cre = 2.23 in the setting of food poisoning   ?Creatinine has normalized now  ? ?BP is borderline elevated  ?She admits to eating more salt  ? ?November 23, 2020: ?Loretta Austin is seen today for a follow-up of her hypertension and coronary artery calcifications.  She has a very strong family history of coronary artery disease.  She has not had any symptoms of angina. ?Coronary calcium score of 518 Agatston units. This was 25th ?percentile for age and sex matched control. ? ?Is not having any coronary symptoms  - no angina  ?Her creatinine has improved  - is now 0.82. ?We have not done a stress test.  ?She does not do much exercise  -  ?Does some water aeorbics at [employer] .  ? ?Has some DOE if she does too much exercise  ? ? ?December 24, 2021 ?Loretta Austin is seen today for follow up of her HTN, ?CAC ?Myoview in march 2022 was normal  ? ?Had COVID in Feb.  ( Has had  ?Then had a nonspecific URI . ?Has a new dog.   Walks a 20 min mile  ?No CP or  dyspnea.  ? ? ?Past Medical History:  ?Diagnosis Date  ? Arthritis   ? Cholecystolithiasis   ? Colon polyps   ? Diverticulosis   ? Food poisoning   ? GERD (gastroesophageal reflux disease)   ? High blood pressure   ? Microscopic colitis   ? Sjogren's disease ([place])   ? Sleep apnea   ? ? ?Past Surgical History:  ?Procedure Laterality Date  ? COLONOSCOPY  04/19/2021  ? 02/12/16-at [place]  ? TONSILLECTOMY AND ADENOIDECTOMY    ? TUBAL LIGATION    ? ? ?Current Medications: ?Current Meds  ?Medication Sig  ? AMBULATORY NON FORMULARY MEDICATION CBD tablets ?Take one tablet by mouth once daily  ? amLODipine (NORVASC) 5 MG tablet Take 5 mg by mouth daily.  ? Bacillus Coagulans-Inulin (ALIGN PREBIOTIC-PROBIOTIC) 5-1.25 MG-GM CHEW See admin instructions.  ? Biotin 10000 MCG TABS Take 1 tablet by mouth daily.  ? budesonide (ENTOCORT EC) 3 MG 24 hr capsule Take as directed.  ? calcium citrate-vitamin D 500-500 MG-UNIT chewable tablet Chew 1 tablet by mouth once a week.  ? Cyanocobalamin (VITAMIN B-12 PO) Take 2.5  mg by mouth daily.  ? folic acid (FOLVITE) 027 MCG tablet Take 800 mcg by mouth daily.  ? HUMIRA 40 MG/0.4ML PSKT Inject 1 Syringe as directed every 14 (fourteen) days.  ? ipratropium (ATROVENT) 0.03 % nasal spray Place 2 sprays into both nostrils as needed.   ? loratadine (CLARITIN) 10 MG tablet Take 10 mg by mouth in the morning and at bedtime.   ? losartan (COZAAR) 100 MG tablet 1 tablet  ? Magnesium 400 MG TABS Take 1 tablet by mouth daily in the afternoon.  ? Melatonin 10 MG TABS Take 10 mg by mouth daily.  ? Multiple Vitamins-Iron (MULTIVITAMIN/IRON PO) Take 1 tablet by mouth daily.  ? nitrofurantoin (MACRODANTIN) 100 MG capsule Take 100 mg by mouth at bedtime.  ? RESTASIS 0.05 % ophthalmic emulsion Place 1 drop into both eyes 2 (two) times daily.   ? rosuvastatin (CRESTOR) 10 MG tablet Take 1 tablet (10 mg total) by mouth daily.  ? ?Current Facility-Administered Medications for the 12/24/21 encounter (Office Visit)  with Rodgers Likes, Wonda Cheng, MD  ?Medication  ? 0.9 %  sodium chloride infusion  ?  ? ?Allergies:   Ciprofloxacin and Flagyl [metronidazole]  ? ?Social History  ? ?Socioeconomic History  ? Marital status: Widowed  ?  Spouse name: Not on file  ? Number of children: 1  ? Years of education: Not on file  ? Highest education level: Not on file  ?Occupational History  ? Occupation: retired  ?Tobacco Use  ? Smoking status: Former  ?  Types: Cigarettes  ? Smokeless tobacco: Never  ?Vaping Use  ? Vaping Use: Never used  ?Substance and Sexual Activity  ? Alcohol use: Yes  ?  Alcohol/week: 1.0 standard drink  ?  Types: 1 Standard drinks or equivalent per week  ? Drug use: Not Currently  ? Sexual activity: Not Currently  ?  Partners: Male  ?Other Topics Concern  ? Not on file  ?Social History Narrative  ? Not on file  ? ?Social Determinants of Health  ? ?Financial Resource Strain: Not on file  ?Food Insecurity: Not on file  ?Transportation Needs: Not on file  ?Physical Activity: Not on file  ?Stress: Not on file  ?Social Connections: Not on file  ?  ? ?Family History: ?The patient's family history includes Colon cancer (age of onset: 53) in her mother; Lung cancer in her mother. There is no history of Esophageal cancer, Rectal cancer, Stomach cancer, or Colon polyps. ? ?ROS:   ?Please see the history of present illness.    ? All other systems reviewed and are negative. ? ?EKGs/Labs/Other Studies Reviewed:   ? ?The following studies were reviewed today: ? ? ? ? ?Recent Labs: ?No results found for requested labs within last 8760 hours.  ?Recent Lipid Panel ?No results found for: CHOL, TRIG, HDL, CHOLHDL, VLDL, LDLCALC, LDLDIRECT ? ?Physical Exam:   ? ?Physical Exam: ?Blood pressure 138/72, pulse 64, height '5\' 5"'$  (1.651 m), weight 176 lb 3.2 oz (79.9 kg), SpO2 98 %. ? ?GEN:  Well nourished, well developed in no acute distress ?HEENT: Normal ?NECK: No JVD; No carotid bruits ?LYMPHATICS: No lymphadenopathy ?CARDIAC: RRR , no murmurs,  rubs, gallops ?RESPIRATORY:  Clear to auscultation without rales, wheezing or rhonchi  ?ABDOMEN: Soft, non-tender, non-distended ?MUSCULOSKELETAL:  No edema; No deformity  ?SKIN: Warm and dry ?NEUROLOGIC:  Alert and oriented x 3 ? ? ? ? ?EKG:      December 24, 2021: Normal sinus rhythm at 64.  She has nonspecific ST and T wave abnormalities in leads V3 through V4.  This may be due to lead placement issues.  She is not having any symptoms. ? ?ASSESSMENT:   ? ?1. Coronary artery calcification   ?2. Mixed hyperlipidemia   ? ? ?PLAN:   ? ?  ? ?Coronary artery calcifications:     ?No symptoms. ?ECG shows some NS changes ( may be due to lead placement ) but with a normal myoview and no symptoms, we will just keep an eye on this  ? ? ?2.  Hypertension:   BP is well controlled.  ? ? ?Medication Adjustments/Labs and Tests Ordered: ?Current medicines are reviewed at length with the patient today.  Concerns regarding medicines are outlined above.  ?Orders Placed This Encounter  ?Procedures  ? EKG 12-Lead  ? ?No orders of the defined types were placed in this encounter. ? ? ? ?Patient Instructions  ?Medication Instructions:  ?Your physician recommends that you continue on your current medications as directed. Please refer to the Current Medication list given to you today. ? ?*If you need a refill on your cardiac medications before your next appointment, please call your pharmacy* ? ?Lab Work: ?NONE ?If you have labs (blood work) drawn today and your tests are completely normal, you will receive your results only by: ?MyChart Message (if you have MyChart) OR ?A paper copy in the mail ?If you have any lab test that is abnormal or we need to change your treatment, we will call you to review the results. ? ?Testing/Procedures: ?NONE ? ?Follow-Up: ?At [facility], you and your health needs are our priority.  As part of our continuing mission to provide you with exceptional heart care, we have created designated Provider Care Teams.   These Care Teams include your primary Cardiologist (physician) and Advanced Practice Providers (APPs -  Physician Assistants and Nurse Practitioners) who all work together to provide you with the care you nee

## 2022-01-03 DIAGNOSIS — L820 Inflamed seborrheic keratosis: Secondary | ICD-10-CM | POA: Diagnosis not present

## 2022-01-03 DIAGNOSIS — L570 Actinic keratosis: Secondary | ICD-10-CM | POA: Diagnosis not present

## 2022-02-10 ENCOUNTER — Ambulatory Visit: Payer: Medicare Other | Admitting: Internal Medicine

## 2022-02-18 NOTE — Progress Notes (Unsigned)
HPI female widowed smoker followed for OSA, lung nodule, cough, complicated by arthritis, GERD, HBP, scleroderma, Sjogren's disease, allergic rhinitis,  Insomnia NPSG 11/ 2013- AHI 6.4/ hr, BMI 33.4 HST 08/02/20- AHI 13.5/ hr, desaturation to 81%, body weight 164 lbs --------------------------------------------------------------------------------------    12/13/20- 76 year old female widowed former smoker followed for OSA/ Oral appliance, lung nodule, cough, complicated by Rheumatoid arthritis/ Humira, GERD, HBP, scleroderma, Sjogren's disease, allergic rhinitis,  Insomnia, HTN, She had reported losing 50 lbs, wanting to d/c CPAP, so we ordered an updated HST. HST 08/02/20- AHI 13.5/ hr, desaturation to 81%, body weight 164 lbs CPAP auto 4-8/ [employer]     Didn't tolerate higher, saying it irritated her GERD>>  changed to oral appliance Download-compliance 73%, AHI 11.2/ hr>> oral appliance Dr Ron Parker Body weight today- 170 lbs Covid vax-3 Phizer Flu vax-had ------Uses oral appliance, doing well Breathing ok- no concerns. Benign CT discussed last visit. With known scleroderma, consider CXR at least every few years.   02/20/22-  76 year old female widowed former smoker followed for OSA/ Oral appliance, lung nodule, cough, complicated by Rheumatoid arthritis/ Humira, GERD, HBP, scleroderma, Sjogren's disease, allergic rhinitis,  Insomnia, HTN, CAD,  Tried CPAP auto 4-8/ [employer]     Didn't tolerate higher, saying it irritated her GERD>>  changed to oral appliance > oral appliance Dr Ron Parker Body weight today-  Covid vax-5 Phizer Flu vax-had -----Patient is doing good still using oral appliance. No concerns She is satisfied with her oral appliance.  Uses Biotene for dry mouth before inserting it each night. Had COVID infection in [place] without residual. Being evaluated for pill dysphagia.  We discussed Sjogren symptoms/rheumatoid arthritis on Humira and past history of small lung nodules that were  considered benign on CT.  Agree to update CXR but she denies significant dyspnea or cough.  ROS-see HPI   + = positive Constitutional:    weight loss, night sweats, fevers, chills, fatigue, lassitude. HEENT:    headaches, difficulty swallowing, tooth/dental problems, sore throat,       sneezing, itching, ear ache, +nasal congestion, post nasal drip, snoring CV:    chest pain, orthopnea, PND, swelling in lower extremities, anasarca,                                   dizziness, palpitations Resp:   shortness of breath with exertion or at rest.                productive cough,   non-productive cough, coughing up of blood.              change in color of mucus.  wheezing.   Skin:    rash or lesions. GI:  + heartburn, indigestion, abdominal pain, nausea, vomiting, diarrhea,                 change in bowel habits, loss of appetite GU: dysuria, change in color of urine, no urgency or frequency.   flank pain. MS:   joint pain, stiffness, decreased range of motion, back pain. Neuro-     nothing unusual Psych:  change in mood or affect.  depression or anxiety.   memory loss.  OBJ- Physical Exam General- Alert, Oriented, Affect-appropriate, Distress- none acute + overweight Skin- rash-none, lesions- none, excoriation- none Lymphadenopathy- none Head- atraumatic            Eyes- Gross vision intact, PERRLA, conjunctivae and secretions clear  Ears- Hearing, canals-normal            Nose-sign turbinate edema, no-Septal dev, mucus, polyps, erosion, perforation             Throat- Mallampati III-IV , mucosa clear , drainage- none, tonsils- absent + throat clearing Neck- flexible , trachea midline, no stridor , thyroid nl, carotid no bruit Chest - symmetrical excursion , unlabored           Heart/CV- RRR , no murmur , no gallop  , no rub, nl s1 s2                           - JVD- none , edema- none, stasis changes- none, varices- none           Lung- clear to P&A/no definite crackles,  wheeze- none, cough- none , dullness-none, rub- none           Chest wall-  Abd-  Br/ Gen/ Rectal- Not done, not indicated Extrem- cyanosis- none, clubbing, none, atrophy- none, strength- nl Neuro- grossly intact to observation

## 2022-02-20 ENCOUNTER — Ambulatory Visit (INDEPENDENT_AMBULATORY_CARE_PROVIDER_SITE_OTHER): Payer: Medicare Other

## 2022-02-20 ENCOUNTER — Ambulatory Visit (INDEPENDENT_AMBULATORY_CARE_PROVIDER_SITE_OTHER): Payer: Medicare Other | Admitting: Internal Medicine

## 2022-02-20 ENCOUNTER — Encounter: Payer: Self-pay | Admitting: Internal Medicine

## 2022-02-20 VITALS — BP 130/70 | HR 62 | Temp 97.6°F | Ht 65.0 in | Wt 176.4 lb

## 2022-02-20 DIAGNOSIS — G4733 Obstructive sleep apnea (adult) (pediatric): Secondary | ICD-10-CM

## 2022-02-20 DIAGNOSIS — M069 Rheumatoid arthritis, unspecified: Secondary | ICD-10-CM

## 2022-02-20 DIAGNOSIS — R918 Other nonspecific abnormal finding of lung field: Secondary | ICD-10-CM | POA: Diagnosis not present

## 2022-02-20 DIAGNOSIS — I2510 Atherosclerotic heart disease of native coronary artery without angina pectoris: Secondary | ICD-10-CM

## 2022-02-20 DIAGNOSIS — I2584 Coronary atherosclerosis due to calcified coronary lesion: Secondary | ICD-10-CM | POA: Diagnosis not present

## 2022-02-20 DIAGNOSIS — R911 Solitary pulmonary nodule: Secondary | ICD-10-CM | POA: Diagnosis not present

## 2022-02-20 NOTE — Patient Instructions (Signed)
Order- CXR    dx lung nodules, Rheumatoid Arthritis  Please call if we can help

## 2022-02-20 NOTE — Assessment & Plan Note (Signed)
Multiple small nodules previously noted on CT were considered benign.  With her history of RA/Sjogren's, she is at some risk for microaspiration, interstitial lung disease. Plan-update CXR

## 2022-02-20 NOTE — Assessment & Plan Note (Signed)
She is satisfied with management using oral appliance. Plan-we agreed to see her again in the future if we can be helpful.

## 2022-02-27 DIAGNOSIS — R1310 Dysphagia, unspecified: Secondary | ICD-10-CM | POA: Diagnosis not present

## 2022-05-09 DIAGNOSIS — I10 Essential (primary) hypertension: Secondary | ICD-10-CM | POA: Diagnosis not present

## 2022-05-09 DIAGNOSIS — Z1152 Encounter for screening for COVID-19: Secondary | ICD-10-CM | POA: Diagnosis not present

## 2022-05-09 DIAGNOSIS — R062 Wheezing: Secondary | ICD-10-CM | POA: Diagnosis not present

## 2022-05-09 DIAGNOSIS — J069 Acute upper respiratory infection, unspecified: Secondary | ICD-10-CM | POA: Diagnosis not present

## 2022-05-09 DIAGNOSIS — R0981 Nasal congestion: Secondary | ICD-10-CM | POA: Diagnosis not present

## 2022-05-09 DIAGNOSIS — R051 Acute cough: Secondary | ICD-10-CM | POA: Diagnosis not present

## 2022-05-09 DIAGNOSIS — R5383 Other fatigue: Secondary | ICD-10-CM | POA: Diagnosis not present

## 2022-05-12 DIAGNOSIS — M1991 Primary osteoarthritis, unspecified site: Secondary | ICD-10-CM | POA: Diagnosis not present

## 2022-05-12 DIAGNOSIS — R5383 Other fatigue: Secondary | ICD-10-CM | POA: Diagnosis not present

## 2022-05-12 DIAGNOSIS — Z79899 Other long term (current) drug therapy: Secondary | ICD-10-CM | POA: Diagnosis not present

## 2022-05-12 DIAGNOSIS — M0609 Rheumatoid arthritis without rheumatoid factor, multiple sites: Secondary | ICD-10-CM | POA: Diagnosis not present

## 2022-05-12 DIAGNOSIS — E663 Overweight: Secondary | ICD-10-CM | POA: Diagnosis not present

## 2022-05-12 DIAGNOSIS — M3500 Sicca syndrome, unspecified: Secondary | ICD-10-CM | POA: Diagnosis not present

## 2022-05-12 DIAGNOSIS — Z6828 Body mass index (BMI) 28.0-28.9, adult: Secondary | ICD-10-CM | POA: Diagnosis not present

## 2022-06-23 DIAGNOSIS — Z23 Encounter for immunization: Secondary | ICD-10-CM | POA: Diagnosis not present

## 2022-06-24 DIAGNOSIS — H43393 Other vitreous opacities, bilateral: Secondary | ICD-10-CM | POA: Diagnosis not present

## 2022-06-24 DIAGNOSIS — H04123 Dry eye syndrome of bilateral lacrimal glands: Secondary | ICD-10-CM | POA: Diagnosis not present

## 2022-06-24 DIAGNOSIS — H524 Presbyopia: Secondary | ICD-10-CM | POA: Diagnosis not present

## 2022-07-02 DIAGNOSIS — Z23 Encounter for immunization: Secondary | ICD-10-CM | POA: Diagnosis not present

## 2022-09-02 ENCOUNTER — Other Ambulatory Visit: Payer: Self-pay

## 2022-09-02 MED ORDER — BUDESONIDE 3 MG PO CPEP: ORAL_CAPSULE | ORAL | 0 refills | Status: DC

## 2022-09-04 DIAGNOSIS — R350 Frequency of micturition: Secondary | ICD-10-CM | POA: Diagnosis not present

## 2022-09-04 DIAGNOSIS — N390 Urinary tract infection, site not specified: Secondary | ICD-10-CM | POA: Diagnosis not present

## 2022-09-24 DIAGNOSIS — Z1231 Encounter for screening mammogram for malignant neoplasm of breast: Secondary | ICD-10-CM | POA: Diagnosis not present

## 2022-10-13 IMAGING — RF DG ESOPHAGUS
11 of 12 series · 14 of 24 positions shown · non-contrast
Comparison: NONE.

CLINICAL DATA: Primary complaint of pharyngeal dysphagia,
particularly with pills. Also history of gastroesophageal reflux
disease.

EXAM:
ESOPHAGUS/BARIUM SWALLOW/TABLET STUDY
TECHNIQUE: Combined double and single contrast examination was performed using
effervescent crystals, high-density barium, and thin liquid barium.
This exam was performed by Awa Tiger, and was supervised
and interpreted by Dr. Francis Yoshida.
FLUOROSCOPY:
Radiation Exposure Index (as provided by the fluoroscopic device):
17.4 mGy Kerma

[Series 1: cp_standard · 2 of 40 frames shown (1 of 11)]
[frame 1/40]
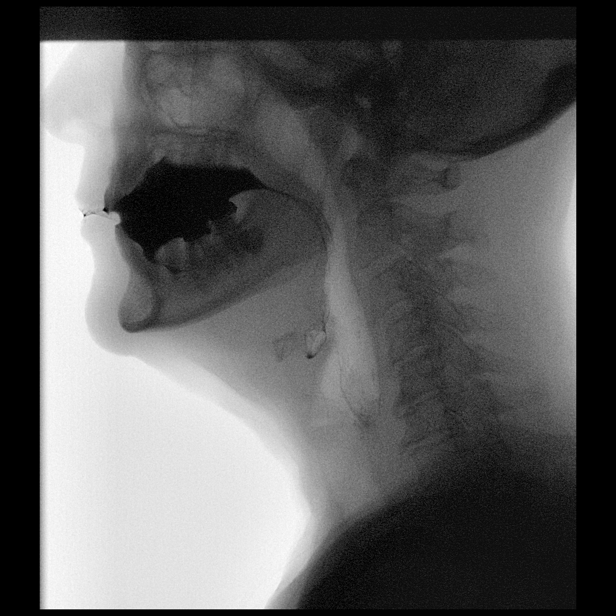
[frame 35/40]
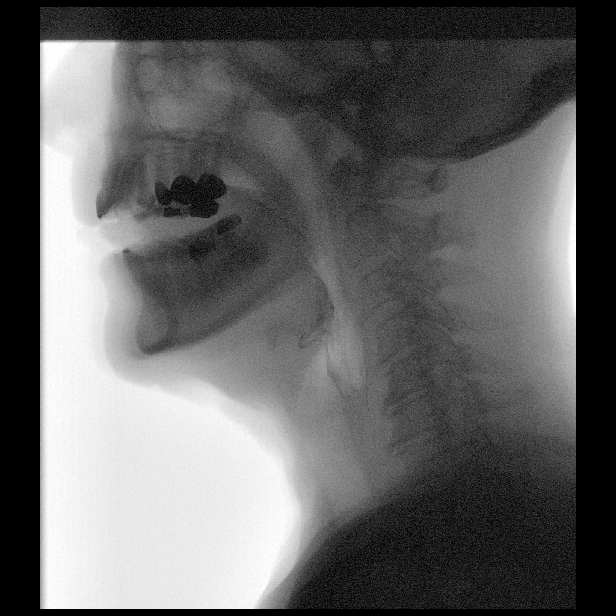

[Series 2: cp_standard · 1 of 42 frames shown (2 of 11)]
[frame 37/42]
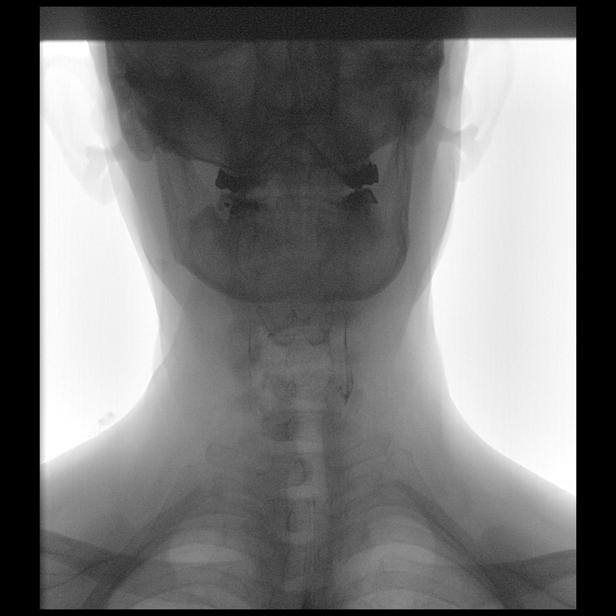

[Series 3: cp_standard · 1 of 173 frames shown (3 of 11)]
[frame 145/173]
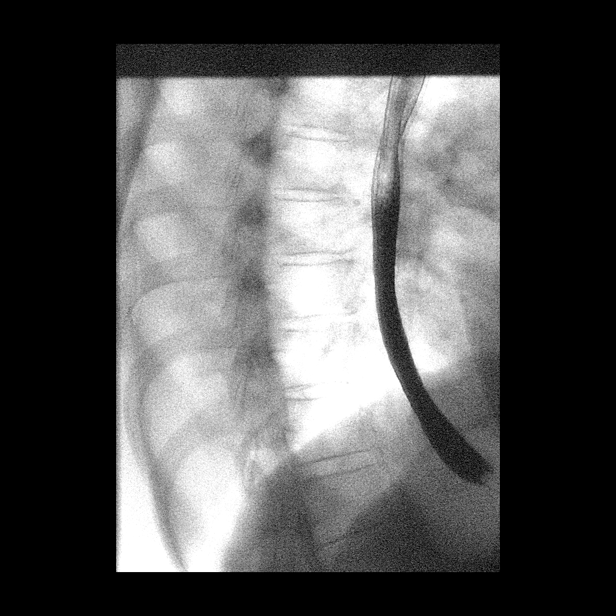

[Series 4: cp_standard · 2 of 55 frames shown (4 of 11)]
[frame 9/55]
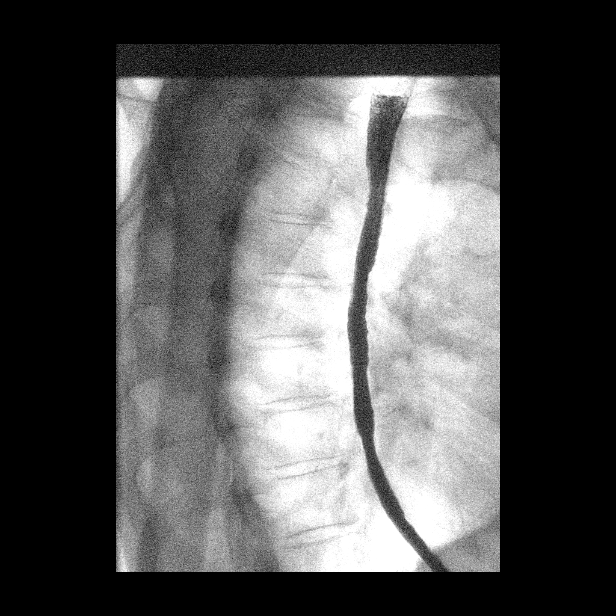
[frame 47/55]
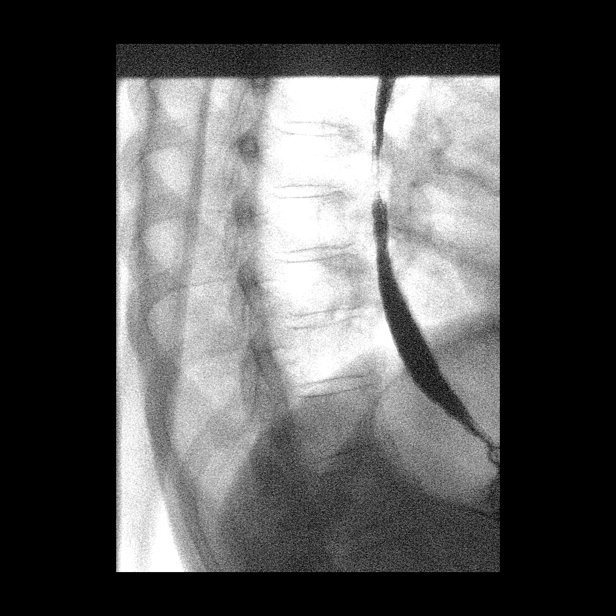

[Series 6: cp_standard · 2 of 90 frames shown (5 of 11)]
[frame 46/90]
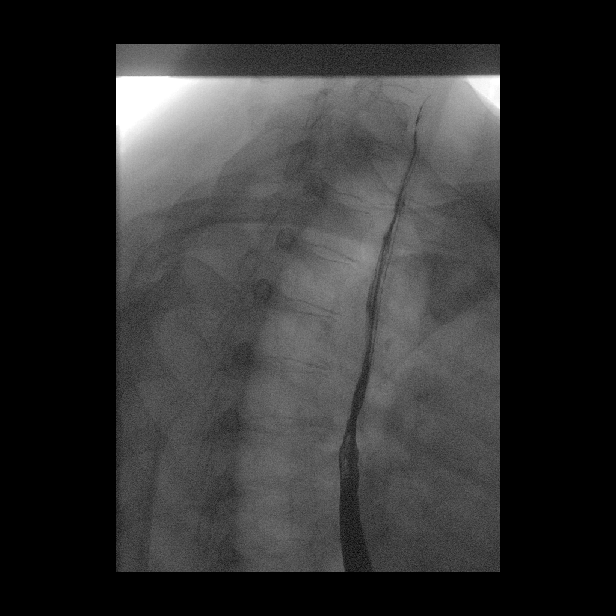
[frame 77/90]
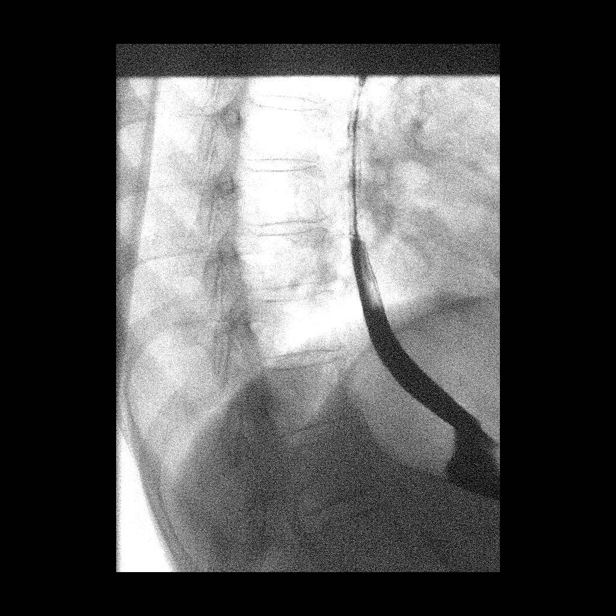

[Series 7: cp_standard · 1 of 155 frames shown (6 of 11)]
[frame 131/155]
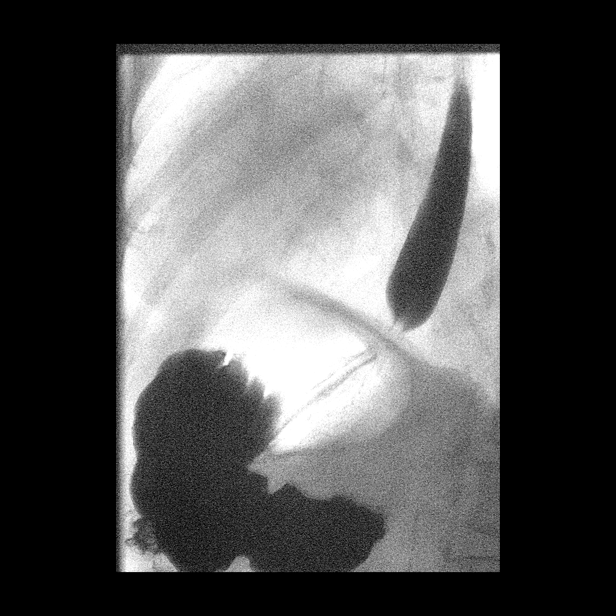

[Series 8: cp_standard · 1 of 181 frames shown (7 of 11)]
[frame 91/181]
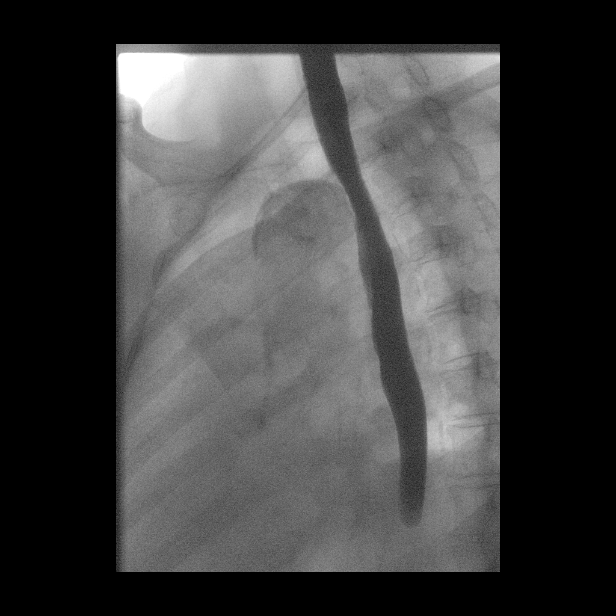

[Series 10: cp_standard · 1 of 1 slices shown (8 of 11)]
[im 1/1]
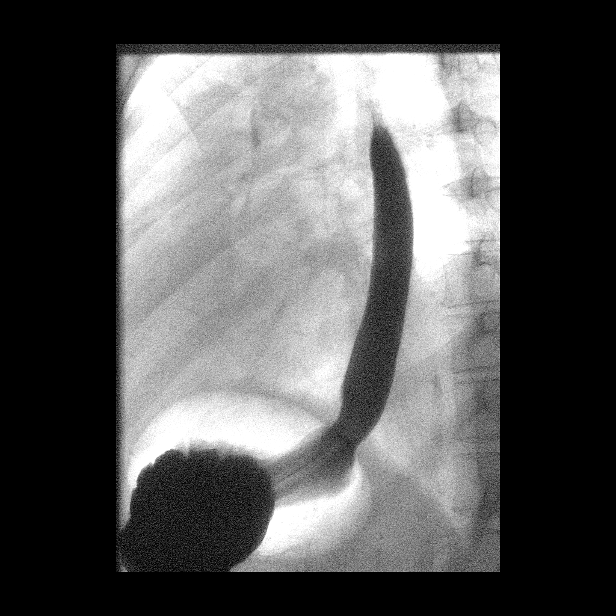

[Series 11: cp_standard · 1 of 1 slices shown (9 of 11)]
[im 1/1]
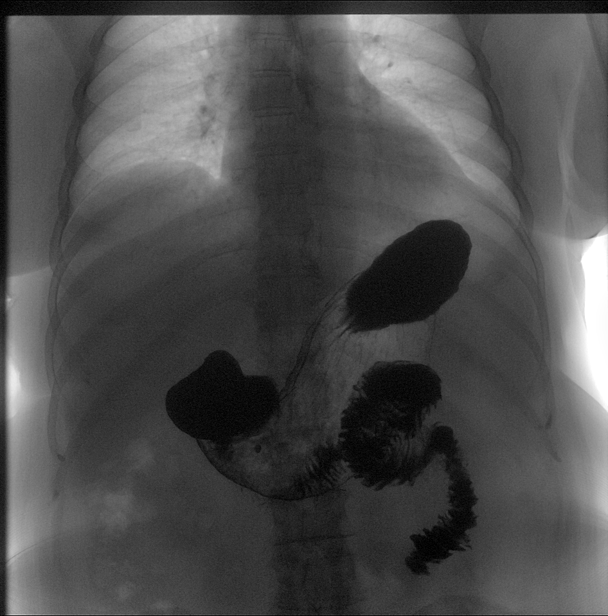

[Series 12: cp_standard · 1 of 100 frames shown (10 of 11)]
[frame 86/100]
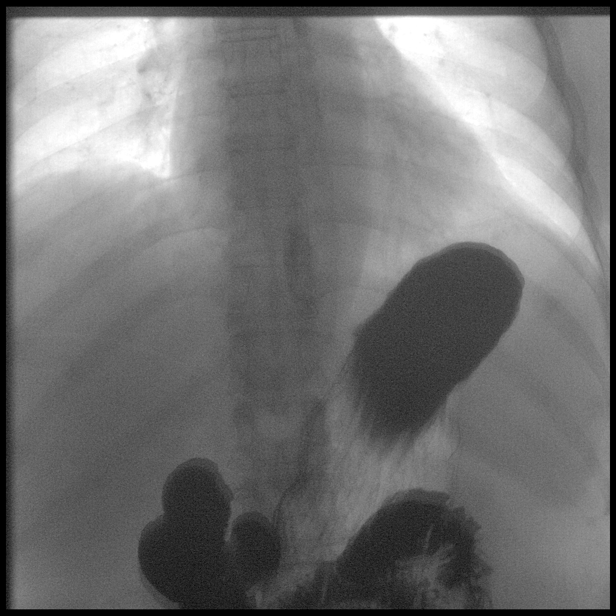

[Series 15: cp_standard · 1 of 1 slices shown (11 of 11)]
[im 1/1]
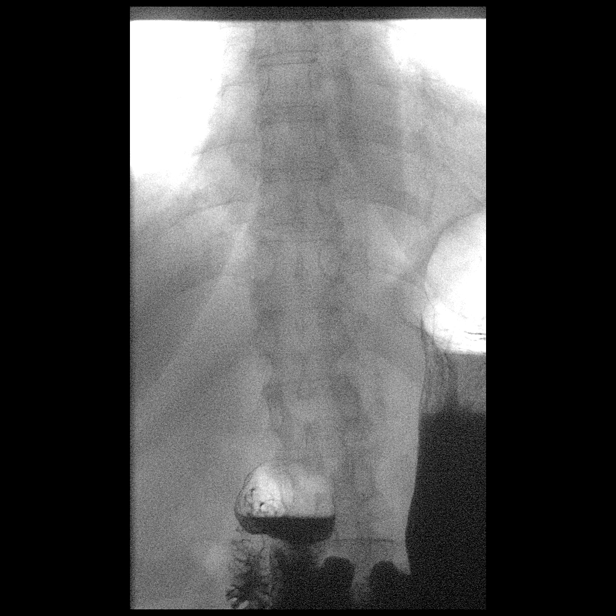

[14 of 24 positions shown; findings below may reference images not displayed]

FINDINGS: Swallowing: Mildly prominent cricopharyngeus muscle. No vestibular
penetration or aspiration seen. No substantial residue.

Pharynx: Unremarkable.

Esophagus: Normal appearance.

Esophageal motility: Within normal limits.

Hiatal Hernia: None.

Gastroesophageal reflux: None visualized.

Ingested 13mm barium tablet: Passed normally

Other: None.
IMPRESSION: Normal esophagram.

## 2022-11-17 DIAGNOSIS — M3501 Sicca syndrome with keratoconjunctivitis: Secondary | ICD-10-CM | POA: Diagnosis not present

## 2022-11-17 DIAGNOSIS — Z79899 Other long term (current) drug therapy: Secondary | ICD-10-CM | POA: Diagnosis not present

## 2022-11-17 DIAGNOSIS — M1991 Primary osteoarthritis, unspecified site: Secondary | ICD-10-CM | POA: Diagnosis not present

## 2022-11-17 DIAGNOSIS — E663 Overweight: Secondary | ICD-10-CM | POA: Diagnosis not present

## 2022-11-17 DIAGNOSIS — Z6829 Body mass index (BMI) 29.0-29.9, adult: Secondary | ICD-10-CM | POA: Diagnosis not present

## 2022-11-17 DIAGNOSIS — M0609 Rheumatoid arthritis without rheumatoid factor, multiple sites: Secondary | ICD-10-CM | POA: Diagnosis not present

## 2022-11-18 ENCOUNTER — Other Ambulatory Visit: Payer: Self-pay | Admitting: Gastroenterology

## 2022-11-20 DIAGNOSIS — D1801 Hemangioma of skin and subcutaneous tissue: Secondary | ICD-10-CM | POA: Diagnosis not present

## 2022-11-20 DIAGNOSIS — L820 Inflamed seborrheic keratosis: Secondary | ICD-10-CM | POA: Diagnosis not present

## 2022-11-20 DIAGNOSIS — L821 Other seborrheic keratosis: Secondary | ICD-10-CM | POA: Diagnosis not present

## 2022-11-20 DIAGNOSIS — D692 Other nonthrombocytopenic purpura: Secondary | ICD-10-CM | POA: Diagnosis not present

## 2022-11-20 DIAGNOSIS — L570 Actinic keratosis: Secondary | ICD-10-CM | POA: Diagnosis not present

## 2022-11-27 DIAGNOSIS — E559 Vitamin D deficiency, unspecified: Secondary | ICD-10-CM | POA: Diagnosis not present

## 2022-11-27 DIAGNOSIS — Z1212 Encounter for screening for malignant neoplasm of rectum: Secondary | ICD-10-CM | POA: Diagnosis not present

## 2022-11-27 DIAGNOSIS — R7989 Other specified abnormal findings of blood chemistry: Secondary | ICD-10-CM | POA: Diagnosis not present

## 2022-11-27 DIAGNOSIS — E7800 Pure hypercholesterolemia, unspecified: Secondary | ICD-10-CM | POA: Diagnosis not present

## 2022-11-27 DIAGNOSIS — R739 Hyperglycemia, unspecified: Secondary | ICD-10-CM | POA: Diagnosis not present

## 2022-11-27 DIAGNOSIS — I10 Essential (primary) hypertension: Secondary | ICD-10-CM | POA: Diagnosis not present

## 2022-11-27 DIAGNOSIS — D649 Anemia, unspecified: Secondary | ICD-10-CM | POA: Diagnosis not present

## 2022-11-27 DIAGNOSIS — K219 Gastro-esophageal reflux disease without esophagitis: Secondary | ICD-10-CM | POA: Diagnosis not present

## 2022-11-27 LAB — HEMOGLOBIN A1C
A1c: 5.3
EGFR (Non-African Amer.): 69.9

## 2022-12-03 DIAGNOSIS — M0609 Rheumatoid arthritis without rheumatoid factor, multiple sites: Secondary | ICD-10-CM | POA: Diagnosis not present

## 2022-12-04 DIAGNOSIS — G4733 Obstructive sleep apnea (adult) (pediatric): Secondary | ICD-10-CM | POA: Diagnosis not present

## 2022-12-04 DIAGNOSIS — E7800 Pure hypercholesterolemia, unspecified: Secondary | ICD-10-CM | POA: Diagnosis not present

## 2022-12-04 DIAGNOSIS — Z1331 Encounter for screening for depression: Secondary | ICD-10-CM | POA: Diagnosis not present

## 2022-12-04 DIAGNOSIS — R739 Hyperglycemia, unspecified: Secondary | ICD-10-CM | POA: Diagnosis not present

## 2022-12-04 DIAGNOSIS — M0609 Rheumatoid arthritis without rheumatoid factor, multiple sites: Secondary | ICD-10-CM | POA: Diagnosis not present

## 2022-12-04 DIAGNOSIS — I700 Atherosclerosis of aorta: Secondary | ICD-10-CM | POA: Diagnosis not present

## 2022-12-04 DIAGNOSIS — R82998 Other abnormal findings in urine: Secondary | ICD-10-CM | POA: Diagnosis not present

## 2022-12-04 DIAGNOSIS — K219 Gastro-esophageal reflux disease without esophagitis: Secondary | ICD-10-CM | POA: Diagnosis not present

## 2022-12-04 DIAGNOSIS — I2510 Atherosclerotic heart disease of native coronary artery without angina pectoris: Secondary | ICD-10-CM | POA: Diagnosis not present

## 2022-12-04 DIAGNOSIS — Z1339 Encounter for screening examination for other mental health and behavioral disorders: Secondary | ICD-10-CM | POA: Diagnosis not present

## 2022-12-04 DIAGNOSIS — I10 Essential (primary) hypertension: Secondary | ICD-10-CM | POA: Diagnosis not present

## 2022-12-04 DIAGNOSIS — Z0000 Encounter for general adult medical examination without abnormal findings: Secondary | ICD-10-CM | POA: Diagnosis not present

## 2022-12-04 DIAGNOSIS — H04123 Dry eye syndrome of bilateral lacrimal glands: Secondary | ICD-10-CM | POA: Diagnosis not present

## 2022-12-04 LAB — LAB REPORT - SCANNED
Albumin, Urine POC: <3
Albumin/Creatinine Ratio, Urine, POC: <5
Creatinine, POC: 62.4 mg/dL

## 2022-12-22 ENCOUNTER — Telehealth: Payer: Self-pay | Admitting: Gastroenterology

## 2022-12-22 NOTE — Telephone Encounter (Signed)
Inbound call from patient, states she was prescribed medication from another provider that have caused lower GI issues. Patient states "it's too hard to explain" and would like to speak to a nurse to further advise. Did not wish to schedule an appointment at this time.

## 2022-12-23 ENCOUNTER — Other Ambulatory Visit (HOSPITAL_COMMUNITY): Payer: Self-pay | Admitting: Internal Medicine

## 2022-12-23 ENCOUNTER — Encounter (HOSPITAL_BASED_OUTPATIENT_CLINIC_OR_DEPARTMENT_OTHER): Payer: Self-pay

## 2022-12-23 ENCOUNTER — Ambulatory Visit (HOSPITAL_BASED_OUTPATIENT_CLINIC_OR_DEPARTMENT_OTHER)
Admission: RE | Admit: 2022-12-23 | Discharge: 2022-12-23 | Disposition: A | Discharge status: Home / Self Care | Payer: Medicare Other | Source: Ambulatory Visit | Attending: Internal Medicine | Admitting: Internal Medicine

## 2022-12-23 DIAGNOSIS — I10 Essential (primary) hypertension: Secondary | ICD-10-CM | POA: Diagnosis not present

## 2022-12-23 DIAGNOSIS — R1031 Right lower quadrant pain: Secondary | ICD-10-CM | POA: Insufficient documentation

## 2022-12-23 DIAGNOSIS — D72825 Bandemia: Secondary | ICD-10-CM

## 2022-12-23 DIAGNOSIS — R195 Other fecal abnormalities: Secondary | ICD-10-CM

## 2022-12-23 DIAGNOSIS — M0609 Rheumatoid arthritis without rheumatoid factor, multiple sites: Secondary | ICD-10-CM | POA: Diagnosis not present

## 2022-12-23 DIAGNOSIS — K8020 Calculus of gallbladder without cholecystitis without obstruction: Secondary | ICD-10-CM | POA: Diagnosis not present

## 2022-12-23 DIAGNOSIS — R1032 Left lower quadrant pain: Secondary | ICD-10-CM | POA: Insufficient documentation

## 2022-12-23 DIAGNOSIS — K5792 Diverticulitis of intestine, part unspecified, without perforation or abscess without bleeding: Secondary | ICD-10-CM | POA: Diagnosis not present

## 2022-12-23 DIAGNOSIS — K219 Gastro-esophageal reflux disease without esophagitis: Secondary | ICD-10-CM | POA: Diagnosis not present

## 2022-12-23 DIAGNOSIS — K5730 Diverticulosis of large intestine without perforation or abscess without bleeding: Secondary | ICD-10-CM | POA: Diagnosis not present

## 2022-12-23 LAB — LAB REPORT - SCANNED: EGFR (Non-African Amer.): 61

## 2022-12-23 LAB — POCT I-STAT CREATININE: Creatinine, Ser: 0.8 mg/dL (ref 0.44–1.00)

## 2022-12-23 MED ORDER — IOHEXOL 350 MG/ML SOLN
100.0000 mL | Freq: Once | INTRAVENOUS | Status: AC | PRN
  Administered 2022-12-23: 60 mL via INTRAVENOUS

## 2022-12-23 NOTE — Telephone Encounter (Signed)
I was out of the office yesterday. Chart has been reviewed. Pt reached out to PCP for guidance. CT scan was ordered, shows sigmoid diverticulitis. Looks like PCP is planning to start patient on Augmentin BID for 10 days and then patient will follow up with Dr. Loletha Carrow on 01/21/23 as scheduled. I called to follow up with patient, she states that she is still waiting for her PCP to contact her regarding results. Pt wanted to know if she needed a sooner appt and I told her she should keep her 5/1 appt as scheduled b/c her PCP is likely going to prescribe her an antibiotic to take for about 10 days. Pt verbalized understanding and had no concerns at the end of the call.

## 2023-01-14 DIAGNOSIS — M0609 Rheumatoid arthritis without rheumatoid factor, multiple sites: Secondary | ICD-10-CM | POA: Diagnosis not present

## 2023-01-16 ENCOUNTER — Encounter: Payer: Self-pay | Admitting: Physician Assistant

## 2023-01-16 ENCOUNTER — Ambulatory Visit: Payer: Medicare Other | Attending: Physician Assistant | Admitting: Physician Assistant

## 2023-01-16 VITALS — BP 123/70 | HR 72 | Ht 65.0 in | Wt 166.8 lb

## 2023-01-16 DIAGNOSIS — I10 Essential (primary) hypertension: Secondary | ICD-10-CM | POA: Insufficient documentation

## 2023-01-16 DIAGNOSIS — E785 Hyperlipidemia, unspecified: Secondary | ICD-10-CM | POA: Diagnosis not present

## 2023-01-16 DIAGNOSIS — I739 Peripheral vascular disease, unspecified: Secondary | ICD-10-CM | POA: Diagnosis not present

## 2023-01-16 DIAGNOSIS — Z01810 Encounter for preprocedural cardiovascular examination: Secondary | ICD-10-CM | POA: Diagnosis not present

## 2023-01-16 DIAGNOSIS — I2584 Coronary atherosclerosis due to calcified coronary lesion: Secondary | ICD-10-CM | POA: Insufficient documentation

## 2023-01-16 DIAGNOSIS — I2510 Atherosclerotic heart disease of native coronary artery without angina pectoris: Secondary | ICD-10-CM | POA: Insufficient documentation

## 2023-01-16 HISTORY — DX: Peripheral vascular disease, unspecified: I73.9

## 2023-01-16 NOTE — Assessment & Plan Note (Signed)
She has gallstones and will likely need surgery soon. Her risk of perioperative major cardiac event is low at 0.4% according to the RCRI (Revised Cardiac Risk Index). She is able to achieve > 4 METs. She is not having any unstable cardiac symptoms. She may proceed with surgery in the near future at acceptable risk without undergoing CV testing.

## 2023-01-16 NOTE — Assessment & Plan Note (Signed)
CAC score in 05/2020 was 518. Myoview in 2022 was low risk. She is not having chest pain to suggest angina. Continue Rosuvastatin 10 mg once daily. F/u 1 year.

## 2023-01-16 NOTE — Assessment & Plan Note (Signed)
She notes R buttock pain with exertion. Her DP/PT pulses are difficult to palpate. She is an ex-smoker. Arrange ABIs/Arterial US to r/o peripheral arterial disease.

## 2023-01-16 NOTE — Patient Instructions (Signed)
Medication Instructions:  Your physician recommends that you continue on your current medications as directed. Please refer to the Current Medication list given to you today.  *If you need a refill on your cardiac medications before your next appointment, please call your pharmacy*   Lab Work: None ordered  If you have labs (blood work) drawn today and your tests are completely normal, you will receive your results only by: MyChart Message (if you have MyChart) OR A paper copy in the mail If you have any lab test that is abnormal or we need to change your treatment, we will call you to review the results.   Testing/Procedures: Your physician has requested that you have a lower extremity arterial exercise duplex w/ abi's.  During this test, exercise and ultrasound are used to evaluate arterial blood flow in the legs. Allow one hour for this exam. There are no restrictions or special instructions.    Follow-Up: At [facility], you and your health needs are our priority.  As part of our continuing mission to provide you with exceptional heart care, we have created designated Provider Care Teams.  These Care Teams include your primary Cardiologist (physician) and Advanced Practice Providers (APPs -  Physician Assistants and Nurse Practitioners) who all work together to provide you with the care you need, when you need it.  We recommend signing up for the patient portal called "MyChart".  Sign up information is provided on this After Visit Summary.  MyChart is used to connect with patients for Virtual Visits (Telemedicine).  Patients are able to view lab/test results, encounter notes, upcoming appointments, etc.  Non-urgent messages can be sent to your provider as well.   To learn more about what you can do with MyChart, go to [email or web address].    Your next appointment:   1 year(s)  Provider:   Delane Ginger, MD     Other Instructions

## 2023-01-16 NOTE — Progress Notes (Signed)
  Cardiology Office Note:    Date:  01/16/2023  ID:  Loretta Austin, DOB [birth year], MRN [id number] PCP: Garlan Fillers, MD  [facility] Providers Cardiologist:  Kristeen Miss, MD       Patient Profile:      Coronary artery Ca2+ CAC score 06/04/2020: CAC score 518 (89th percentile); aortic atherosclerosis Myoview 12/05/2020: EF 72, normal perfusion, low risk Hypertension  Chronic kidney disease (GFR ~ 20) Rheumatoid arthritis OSA       History of Present Illness:   Loretta Austin is a 77 y.o. female returns for follow-up of CAD.  She was last seen by Dr. Elease Hashimoto 12/24/2021. She is here alone. She recently had a bout of diverticulitis. She has recovered. She has also found out that she has a gallstone and meets with gen surgery soon. She has not had chest pain, shortness of breath, syncope, leg edema.   Review of Systems  Cardiovascular:  Positive for claudication (R hip/buttocks).  Hematologic/Lymphatic: Bruises/bleeds easily.   See HPI    Studies Reviewed:    EKG:  normal sinus rhythm, HR 72, 1st degree AVB, PR 240 ms, non-specific ST-TW changes, QTc 422 ms   Risk Assessment/Calculations:             Physical Exam:   VS:  BP 123/70   Pulse 72   Ht 5\' 5"  (1.651 m)   Wt 166 lb 12.8 oz (75.7 kg)   SpO2 98%   BMI 27.76 kg/m    Wt Readings from Last 3 Encounters:  01/16/23 166 lb 12.8 oz (75.7 kg)  02/20/22 176 lb 6.4 oz (80 kg)  12/24/21 176 lb 3.2 oz (79.9 kg)    Constitutional:      Appearance: Healthy appearance. Not in distress.  Neck:     Vascular: No carotid bruit. JVD normal.  Pulmonary:     Breath sounds: Normal breath sounds. No wheezing. No rales.  Cardiovascular:     Normal rate. Regular rhythm.     Murmurs: There is no murmur.  Edema:    Peripheral edema absent.  Abdominal:     Palpations: Abdomen is soft.  Skin:    Findings: Ecchymosis (bilat arms) present.       ASSESSMENT AND PLAN:   Coronary artery calcification CAC score in 05/2020  was 518. Myoview in 2022 was low risk. She is not having chest pain to suggest angina. Continue Rosuvastatin 10 mg once daily. F/u 1 year.   Hypertension BP is controlled. Continue Amlodipine 5 mg once daily, Losartan 100 mg once daily.   Hyperlipidemia LDL goal <70 Labs from PCP (via [facility]) reviewed. LDL in 11/2022 was optimal at 65. Continue Crestor 10 mg once daily.  Preoperative cardiovascular examination She has gallstones and will likely need surgery soon. Her risk of perioperative major cardiac event is low at 0.4% according to the RCRI (Revised Cardiac Risk Index). She is able to achieve > 4 METs. She is not having any unstable cardiac symptoms. She may proceed with surgery in the near future at acceptable risk without undergoing CV testing.   Claudication [facility]) She notes R buttock pain with exertion. Her DP/PT pulses are difficult to palpate. She is an ex-smoker. Arrange ABIs/Arterial US to r/o peripheral arterial disease.       Dispo:  Return in about 1 year (around 01/16/2024) for Routine Follow Up, w/ Dr. Elease Hashimoto, or Tereso Newcomer, PA-C.  Signed, Tereso Newcomer, PA-C

## 2023-01-16 NOTE — Assessment & Plan Note (Signed)
BP is controlled. Continue Amlodipine 5 mg once daily, Losartan 100 mg once daily.

## 2023-01-16 NOTE — Assessment & Plan Note (Signed)
Labs from PCP (via [facility]) reviewed. LDL in 11/2022 was optimal at 65. Continue Crestor 10 mg once daily.

## 2023-01-21 ENCOUNTER — Encounter: Payer: Self-pay | Admitting: Gastroenterology

## 2023-01-21 ENCOUNTER — Ambulatory Visit (INDEPENDENT_AMBULATORY_CARE_PROVIDER_SITE_OTHER): Payer: Medicare Other | Admitting: Gastroenterology

## 2023-01-21 VITALS — BP 124/68 | HR 64 | Ht 65.0 in | Wt 169.0 lb

## 2023-01-21 DIAGNOSIS — K52831 Collagenous colitis: Secondary | ICD-10-CM

## 2023-01-21 DIAGNOSIS — K8020 Calculus of gallbladder without cholecystitis without obstruction: Secondary | ICD-10-CM

## 2023-01-21 DIAGNOSIS — K5792 Diverticulitis of intestine, part unspecified, without perforation or abscess without bleeding: Secondary | ICD-10-CM

## 2023-01-21 NOTE — Progress Notes (Signed)
[facility] Consult Note:  History: Ayat Drenning 01/21/2023  Referring provider: Garlan Fillers, MD  Reason for consult/chief complaint: Diverticulitis (Pt is here for diverticulitis follow up visit. Pt states she is feeling better )   Subjective  HPI: I last saw back in March 2023, at which time she was doing well on budesonide for her longstanding collagenous colitis.  Last colonoscopy 2022. Several weeks ago she had acute onset lower abdominal pain and saw her primary care provider, CT scan was ordered and diverticulitis diagnosed, large incidental gallstone also seen on imaging.  Diverticulitis treated with Augmentin.  (Previously reported rash reaction to ciprofloxacin and metronidazole)  Kriste Basque says she is feeling much better after the course of antibiotics.  Abdominal pain has resolved.  She remains on budesonide since I last saw her to control the collagenous colitis, and is not having diarrhea.  Denies rectal bleeding.  She recalls an episode of diverticulitis about 15 years ago also treated with antibiotics.  She also changed from Humira to Simponi for RA several months ago and is currently in the loading phase of that treatment.  She wondered whether that therapy could have contributed to the diverticulitis. ROS:  Review of Systems  Constitutional:  Negative for appetite change and unexpected weight change.  HENT:  Negative for mouth sores and voice change.   Eyes:  Negative for pain and redness.  Respiratory:  Negative for cough and shortness of breath.   Cardiovascular:  Negative for chest pain and palpitations.  Genitourinary:  Negative for dysuria and hematuria.  Musculoskeletal:  Positive for arthralgias. Negative for myalgias.  Skin:  Negative for pallor and rash.  Neurological:  Negative for weakness and headaches.  Hematological:  Negative for adenopathy.     Past Medical History: Past Medical History:  Diagnosis Date   Arthritis     Cholecystolithiasis    Colon polyps    Diverticulosis    Food poisoning    GERD (gastroesophageal reflux disease)    High blood pressure    Microscopic colitis    Sjogren's disease (HCC)    Sleep apnea      Past Surgical History: Past Surgical History:  Procedure Laterality Date   COLONOSCOPY  04/19/2021   02/12/16-at [place]   TONSILLECTOMY AND ADENOIDECTOMY     TUBAL LIGATION       Family History: Family History  Problem Relation Age of Onset   Lung cancer Mother    Colon cancer Mother 27   Esophageal cancer Neg Hx    Rectal cancer Neg Hx    Stomach cancer Neg Hx    Colon polyps Neg Hx     Social History: Social History   Socioeconomic History   Marital status: Widowed    Spouse name: Not on file   Number of children: 1   Years of education: Not on file   Highest education level: Not on file  Occupational History   Occupation: retired  Tobacco Use   Smoking status: Former    Types: Cigarettes   Smokeless tobacco: Never  Vaping Use   Vaping Use: Never used  Substance and Sexual Activity   Alcohol use: Yes    Alcohol/week: 1.0 standard drink of alcohol    Types: 1 Standard drinks or equivalent per week   Drug use: Not Currently   Sexual activity: Not Currently    Partners: Male  Other Topics Concern   Not on file  Social History Narrative   Not on file  Social Determinants of Health   Financial Resource Strain: Not on file  Food Insecurity: Not on file  Transportation Needs: Not on file  Physical Activity: Not on file  Stress: Not on file  Social Connections: Not on file    Allergies: Allergies  Allergen Reactions   Ciprofloxacin Rash   Metronidazole Rash    Outpatient Meds: Current Outpatient Medications  Medication Sig Dispense Refill   AMBULATORY NON FORMULARY MEDICATION CBD tablets Take one tablet by mouth once daily     amLODipine (NORVASC) 5 MG tablet Take 5 mg by mouth daily.     Bacillus Coagulans-Inulin (ALIGN  PREBIOTIC-PROBIOTIC) 5-1.25 MG-GM CHEW See admin instructions.     Biotin 16109 MCG TABS Take 1 tablet by mouth daily.     budesonide (ENTOCORT EC) 3 MG 24 hr capsule TAKE AS DIRECTED 90 capsule 0   calcium citrate-vitamin D 500-500 MG-UNIT chewable tablet Chew 1 tablet by mouth once a week.     Cyanocobalamin (VITAMIN B-12 PO) Take 2.5 mg by mouth daily.     fluticasone (FLONASE) 50 MCG/ACT nasal spray Place 1-2 sprays into both nostrils daily.     folic acid (FOLVITE) 800 MCG tablet Take 800 mcg by mouth daily.     golimumab (SIMPONI ARIA) 50 MG/4ML SOLN injection Inject 2 mg into the vein every 8 (eight) weeks.     loratadine (CLARITIN) 10 MG tablet Take 10 mg by mouth in the morning and at bedtime.      losartan (COZAAR) 100 MG tablet 1 tablet     Magnesium 400 MG TABS Take 1 tablet by mouth daily in the afternoon.     Melatonin 10 MG TABS Take 10 mg by mouth daily.     Multiple Vitamins-Iron (MULTIVITAMIN/IRON PO) Take 1 tablet by mouth daily.     nitrofurantoin (MACRODANTIN) 100 MG capsule Take 100 mg by mouth at bedtime.     omeprazole (PRILOSEC) 20 MG capsule Take 20 mg by mouth daily.     RESTASIS 0.05 % ophthalmic emulsion Place 1 drop into both eyes 2 (two) times daily.      rosuvastatin (CRESTOR) 10 MG tablet Take 1 tablet (10 mg total) by mouth daily. 90 tablet 3   Current Facility-Administered Medications  Medication Dose Route Frequency Provider Last Rate Last Admin   0.9 %  sodium chloride infusion  500 mL Intravenous Once Danis, Andreas Blower, MD          ___________________________________________________________________ Objective   Exam:  BP 124/68 (BP Location: Left Arm, Patient Position: Sitting, Cuff Size: Normal)   Pulse 64   Ht 5\' 5"  (1.651 m)   Wt 169 lb (76.7 kg)   SpO2 99%   BMI 28.12 kg/m  Wt Readings from Last 3 Encounters:  01/21/23 169 lb (76.7 kg)  01/16/23 166 lb 12.8 oz (75.7 kg)  02/20/22 176 lb 6.4 oz (80 kg)    General:  Well-appearing Eyes: sclera anicteric, no redness ENT: oral mucosa moist without lesions, no cervical or supraclavicular lymphadenopathy CV: Regular without appreciable murmur, no JVD, no peripheral edema Resp: clear to auscultation bilaterally, normal RR and effort noted GI: soft, no tenderness, with active bowel sounds. No guarding or palpable organomegaly noted. Skin; warm and dry, no rash or jaundice noted Neuro: awake, alert and oriented x 3. Normal gross motor function and fluent speech  No recent labs (if any) available for review  Radiologic Studies:  CLINICAL DATA:  Lower abdominal pain for 2 days. Previous history  of diverticulitis.   EXAM: CT ABDOMEN AND PELVIS WITH CONTRAST   TECHNIQUE: Multidetector CT imaging of the abdomen and pelvis was performed using the standard protocol following bolus administration of intravenous contrast.   RADIATION DOSE REDUCTION: This exam was performed according to the departmental dose-optimization program which includes automated exposure control, adjustment of the mA and/or kV according to patient size and/or use of iterative reconstruction technique.   CONTRAST:  60mL OMNIPAQUE IOHEXOL 350 MG/ML SOLN   COMPARISON:  CT 03/08/2020   FINDINGS: Lower chest: There is some linear opacity lung bases likely scar or atelectasis. No pleural effusion. 3 mm subpleural nodule left lower lobe is stable going back to a study of June 2021 demonstrating long-term stability. No specific imaging follow-up. Calcifications along the coronary arteries are noted.   Hepatobiliary: Large stone in the nondilated gallbladder. Patent portal vein. 5 mm low-attenuation lesion seen in segment 6 of the liver on series 2, image 33, too small to completely characterize although possible tiny cyst and no specific imaging follow-up.   Pancreas: Unremarkable. No pancreatic ductal dilatation or surrounding inflammatory changes.   Spleen: Normal in size  without focal abnormality.   Adrenals/Urinary Tract: Minimal thickening of the adrenal glands is stable from previous. Lobular kidneys. There is some volume loss of the right kidney particularly towards the upper pole. No enhancing renal mass or collecting system dilatation. The ureters have normal course and caliber extending down to the bladder. Slightly low-lying bladder. Please correlate for evidence of prolapse. Preserved contour to the urinary bladder otherwise.   Stomach/Bowel: Large bowel demonstrates colonic diverticulosis of the bowel particularly of the descending and sigmoid colon with wall thickening, stranding consistent with diverticulitis of the distal sigmoid colon. No abscess formation at this time, obstruction or free air. Stomach and small bowel are nondilated.   Vascular/Lymphatic: Normal caliber aorta and IVC with scattered vascular calcifications. No discrete abnormal lymph node enlargement identified in the abdomen and pelvis. There are a few small less than 1 cm in size in short axis retroperitoneal nodes, nonpathologic by size criteria. These could be reactive.   Reproductive: Uterus and bilateral adnexa are unremarkable.   Other: Slight rectus muscle diastasis. Mild protuberance at the level of the umbilicus.   Musculoskeletal: Scattered degenerative changes of the spine and pelvis. There are areas of disc bulging and stenosis seen particularly at L3, L4-5 and L5-S1.   IMPRESSION: Changes of acute sigmoid colon diverticulitis. No complex features at this time. Recommend follow-up to confirm clearance and exclude secondary pathology.   Gallstone.   The findings were called to the ordering service, the office and Dr. Eloise Harman by myself at 1:08 p.m. Eastern standard time on 12/23/2022     Electronically Signed   By: Karen Kays M.D.   On: 12/23/2022 13:11    Assessment: Encounter Diagnoses  Name Primary?   Acute diverticulitis Yes    Collagenous colitis    Calculus of gallbladder without cholecystitis without obstruction     Acute uncomplicated diverticulitis, symptoms now resolved after course of Augmentin.  She reports previous rash reaction to Cipro and/or Flagyl.  She started to have some itching within a day or 2 of Augmentin better primary care provider prescribed something that apparently improved that such that she can finish the antibiotic course.  No further testing or treatment of that episode is needed at present.  Unknown whether or not her new RA treatment could have contributed to his diverticulitis.  Collagenous colitis under control on long-term  budesonide.  Incidental large gallstone seen on CT scan.  She does not currently have any symptoms to suggest biliary colic.  PCP already referred her to [facility] surgery, she has an upcoming appointment to get their opinion on whether or not she should have cholecystectomy.  Thank you for the courtesy of this consult.  Please call me with any questions or concerns.  Charlie Pitter III  CC: Referring provider noted above

## 2023-01-21 NOTE — Patient Instructions (Signed)
_______________________________________________________  If your blood pressure at your visit was 140/90 or greater, please contact your primary care physician to follow up on this.  _______________________________________________________  If you are age 76 or older, your body mass index should be between 23-30. Your Body mass index is 28.12 kg/m. If this is out of the aforementioned range listed, please consider follow up with your Primary Care Provider.  If you are age 40 or younger, your body mass index should be between 19-25. Your Body mass index is 28.12 kg/m. If this is out of the aformentioned range listed, please consider follow up with your Primary Care Provider.   ________________________________________________________  The [facility] providers would like to encourage you to use [facility] to communicate with providers for non-urgent requests or questions.  Due to long hold times on the telephone, sending your provider a message by [facility] may be a faster and more efficient way to get a response.  Please allow 48 business hours for a response.  Please remember that this is for non-urgent requests.  _______________________________________________________  It was a pleasure to see you today!  Thank you for trusting me with your gastrointestinal care!

## 2023-02-02 DIAGNOSIS — K8020 Calculus of gallbladder without cholecystitis without obstruction: Secondary | ICD-10-CM | POA: Diagnosis not present

## 2023-02-05 ENCOUNTER — Ambulatory Visit (HOSPITAL_COMMUNITY)
Admission: RE | Admit: 2023-02-05 | Discharge: 2023-02-05 | Disposition: A | Discharge status: Home / Self Care | Payer: Medicare Other | Source: Ambulatory Visit | Attending: Physician Assistant | Admitting: Physician Assistant

## 2023-02-05 DIAGNOSIS — I739 Peripheral vascular disease, unspecified: Secondary | ICD-10-CM | POA: Diagnosis not present

## 2023-02-05 DIAGNOSIS — I2510 Atherosclerotic heart disease of native coronary artery without angina pectoris: Secondary | ICD-10-CM | POA: Diagnosis not present

## 2023-02-05 DIAGNOSIS — I2584 Coronary atherosclerosis due to calcified coronary lesion: Secondary | ICD-10-CM | POA: Insufficient documentation

## 2023-02-06 LAB — VAS US ABI WITH/WO TBI
Left ABI: 1.12
Right ABI: 0.91

## 2023-02-09 ENCOUNTER — Encounter: Payer: Self-pay | Admitting: Physician Assistant

## 2023-03-11 DIAGNOSIS — M0609 Rheumatoid arthritis without rheumatoid factor, multiple sites: Secondary | ICD-10-CM | POA: Diagnosis not present

## 2023-05-11 DIAGNOSIS — R5383 Other fatigue: Secondary | ICD-10-CM | POA: Diagnosis not present

## 2023-05-11 DIAGNOSIS — Z79899 Other long term (current) drug therapy: Secondary | ICD-10-CM | POA: Diagnosis not present

## 2023-05-11 DIAGNOSIS — M0609 Rheumatoid arthritis without rheumatoid factor, multiple sites: Secondary | ICD-10-CM | POA: Diagnosis not present

## 2023-05-20 DIAGNOSIS — E663 Overweight: Secondary | ICD-10-CM | POA: Diagnosis not present

## 2023-05-20 DIAGNOSIS — M3501 Sicca syndrome with keratoconjunctivitis: Secondary | ICD-10-CM | POA: Diagnosis not present

## 2023-05-20 DIAGNOSIS — Z6828 Body mass index (BMI) 28.0-28.9, adult: Secondary | ICD-10-CM | POA: Diagnosis not present

## 2023-05-20 DIAGNOSIS — M0609 Rheumatoid arthritis without rheumatoid factor, multiple sites: Secondary | ICD-10-CM | POA: Diagnosis not present

## 2023-05-20 DIAGNOSIS — Z79899 Other long term (current) drug therapy: Secondary | ICD-10-CM | POA: Diagnosis not present

## 2023-05-20 DIAGNOSIS — M1991 Primary osteoarthritis, unspecified site: Secondary | ICD-10-CM | POA: Diagnosis not present

## 2023-05-29 DIAGNOSIS — Z23 Encounter for immunization: Secondary | ICD-10-CM | POA: Diagnosis not present

## 2023-06-16 DIAGNOSIS — H04123 Dry eye syndrome of bilateral lacrimal glands: Secondary | ICD-10-CM | POA: Diagnosis not present

## 2023-06-16 DIAGNOSIS — H524 Presbyopia: Secondary | ICD-10-CM | POA: Diagnosis not present

## 2023-06-17 ENCOUNTER — Telehealth: Payer: Self-pay | Admitting: Gastroenterology

## 2023-06-17 NOTE — Telephone Encounter (Signed)
Thank you for the update.  She has collagenous colitis, which tends to be a relapsing condition, so there is the possibility of the diarrhea may return at some point.  If that should happen, I would like to hear from her so I can give her advice on when to use the budesonide under what dose.  H Danis

## 2023-06-17 NOTE — Telephone Encounter (Signed)
Called and spoke with patient. Pt states that she had done well on Budesonide then started experiencing constipation. Pt decreased her dose to 3 mg every other day and has now been off of Budesonide for about a month and doing well. Pt just wanted to let you know. I told pt that I would pass the update to you and if you had any recommendations we would let her know.

## 2023-06-17 NOTE — Telephone Encounter (Signed)
Called and spoke with patient regarding recommendations below. Pt verbalized understanding and had no concerns at the end of the call.

## 2023-06-17 NOTE — Telephone Encounter (Signed)
Inbound call from patient wanting to give a health update.   Patient no longer taking budesonide medication.  States she is doing well.    Requesting a call back from a nurse . Please advise.

## 2023-06-19 DIAGNOSIS — M7021 Olecranon bursitis, right elbow: Secondary | ICD-10-CM | POA: Diagnosis not present

## 2023-06-26 DIAGNOSIS — M7021 Olecranon bursitis, right elbow: Secondary | ICD-10-CM | POA: Diagnosis not present

## 2023-08-19 DIAGNOSIS — M0609 Rheumatoid arthritis without rheumatoid factor, multiple sites: Secondary | ICD-10-CM | POA: Diagnosis not present

## 2023-08-19 DIAGNOSIS — E663 Overweight: Secondary | ICD-10-CM | POA: Diagnosis not present

## 2023-08-19 DIAGNOSIS — Z79899 Other long term (current) drug therapy: Secondary | ICD-10-CM | POA: Diagnosis not present

## 2023-08-19 DIAGNOSIS — Z6828 Body mass index (BMI) 28.0-28.9, adult: Secondary | ICD-10-CM | POA: Diagnosis not present

## 2023-08-19 DIAGNOSIS — M1991 Primary osteoarthritis, unspecified site: Secondary | ICD-10-CM | POA: Diagnosis not present

## 2023-08-19 DIAGNOSIS — M3501 Sicca syndrome with keratoconjunctivitis: Secondary | ICD-10-CM | POA: Diagnosis not present

## 2023-08-25 DIAGNOSIS — M65312 Trigger thumb, left thumb: Secondary | ICD-10-CM | POA: Diagnosis not present

## 2023-09-01 DIAGNOSIS — R350 Frequency of micturition: Secondary | ICD-10-CM | POA: Diagnosis not present

## 2023-10-07 DIAGNOSIS — Z1231 Encounter for screening mammogram for malignant neoplasm of breast: Secondary | ICD-10-CM | POA: Diagnosis not present

## 2023-10-07 LAB — HM MAMMOGRAPHY

## 2023-10-20 DIAGNOSIS — R922 Inconclusive mammogram: Secondary | ICD-10-CM | POA: Diagnosis not present

## 2023-10-20 DIAGNOSIS — N6321 Unspecified lump in the left breast, upper outer quadrant: Secondary | ICD-10-CM | POA: Diagnosis not present

## 2023-10-27 ENCOUNTER — Other Ambulatory Visit: Payer: Self-pay | Admitting: Radiology

## 2023-10-27 DIAGNOSIS — N6032 Fibrosclerosis of left breast: Secondary | ICD-10-CM | POA: Diagnosis not present

## 2023-10-27 DIAGNOSIS — N6321 Unspecified lump in the left breast, upper outer quadrant: Secondary | ICD-10-CM | POA: Diagnosis not present

## 2023-10-28 LAB — SURGICAL PATHOLOGY

## 2023-11-25 DIAGNOSIS — M0609 Rheumatoid arthritis without rheumatoid factor, multiple sites: Secondary | ICD-10-CM | POA: Diagnosis not present

## 2023-11-25 DIAGNOSIS — R5383 Other fatigue: Secondary | ICD-10-CM | POA: Diagnosis not present

## 2023-11-25 DIAGNOSIS — Z79899 Other long term (current) drug therapy: Secondary | ICD-10-CM | POA: Diagnosis not present

## 2023-11-25 DIAGNOSIS — M1991 Primary osteoarthritis, unspecified site: Secondary | ICD-10-CM | POA: Diagnosis not present

## 2023-11-25 DIAGNOSIS — Z6827 Body mass index (BMI) 27.0-27.9, adult: Secondary | ICD-10-CM | POA: Diagnosis not present

## 2023-11-25 DIAGNOSIS — M3501 Sicca syndrome with keratoconjunctivitis: Secondary | ICD-10-CM | POA: Diagnosis not present

## 2023-11-25 DIAGNOSIS — E663 Overweight: Secondary | ICD-10-CM | POA: Diagnosis not present

## 2023-12-03 DIAGNOSIS — L821 Other seborrheic keratosis: Secondary | ICD-10-CM | POA: Diagnosis not present

## 2023-12-03 DIAGNOSIS — L218 Other seborrheic dermatitis: Secondary | ICD-10-CM | POA: Diagnosis not present

## 2023-12-03 DIAGNOSIS — C44329 Squamous cell carcinoma of skin of other parts of face: Secondary | ICD-10-CM | POA: Diagnosis not present

## 2023-12-03 DIAGNOSIS — L570 Actinic keratosis: Secondary | ICD-10-CM | POA: Diagnosis not present

## 2023-12-03 DIAGNOSIS — Z85828 Personal history of other malignant neoplasm of skin: Secondary | ICD-10-CM | POA: Diagnosis not present

## 2023-12-15 DIAGNOSIS — Z1212 Encounter for screening for malignant neoplasm of rectum: Secondary | ICD-10-CM | POA: Diagnosis not present

## 2023-12-15 DIAGNOSIS — E7800 Pure hypercholesterolemia, unspecified: Secondary | ICD-10-CM | POA: Diagnosis not present

## 2023-12-15 DIAGNOSIS — I2510 Atherosclerotic heart disease of native coronary artery without angina pectoris: Secondary | ICD-10-CM | POA: Diagnosis not present

## 2023-12-15 DIAGNOSIS — R739 Hyperglycemia, unspecified: Secondary | ICD-10-CM | POA: Diagnosis not present

## 2023-12-15 DIAGNOSIS — I10 Essential (primary) hypertension: Secondary | ICD-10-CM | POA: Diagnosis not present

## 2023-12-15 DIAGNOSIS — E559 Vitamin D deficiency, unspecified: Secondary | ICD-10-CM | POA: Diagnosis not present

## 2023-12-15 DIAGNOSIS — D649 Anemia, unspecified: Secondary | ICD-10-CM | POA: Diagnosis not present

## 2023-12-15 LAB — LAB REPORT - SCANNED
A1c: 5.4
EGFR (Non-African Amer.): 69.7

## 2023-12-18 DIAGNOSIS — R82998 Other abnormal findings in urine: Secondary | ICD-10-CM | POA: Diagnosis not present

## 2023-12-18 LAB — LAB REPORT - SCANNED
Albumin, Urine POC: <3
Albumin/Creatinine Ratio, Urine, POC: <8
Creatinine, POC: 36.3 mg/dL

## 2023-12-22 DIAGNOSIS — M8589 Other specified disorders of bone density and structure, multiple sites: Secondary | ICD-10-CM | POA: Diagnosis not present

## 2023-12-22 DIAGNOSIS — E7800 Pure hypercholesterolemia, unspecified: Secondary | ICD-10-CM | POA: Diagnosis not present

## 2023-12-22 DIAGNOSIS — K8020 Calculus of gallbladder without cholecystitis without obstruction: Secondary | ICD-10-CM | POA: Diagnosis not present

## 2023-12-22 DIAGNOSIS — Z1331 Encounter for screening for depression: Secondary | ICD-10-CM | POA: Diagnosis not present

## 2023-12-22 DIAGNOSIS — Z0000 Encounter for general adult medical examination without abnormal findings: Secondary | ICD-10-CM | POA: Diagnosis not present

## 2023-12-22 DIAGNOSIS — M0609 Rheumatoid arthritis without rheumatoid factor, multiple sites: Secondary | ICD-10-CM | POA: Diagnosis not present

## 2023-12-22 DIAGNOSIS — G4733 Obstructive sleep apnea (adult) (pediatric): Secondary | ICD-10-CM | POA: Diagnosis not present

## 2023-12-22 DIAGNOSIS — K219 Gastro-esophageal reflux disease without esophagitis: Secondary | ICD-10-CM | POA: Diagnosis not present

## 2023-12-22 DIAGNOSIS — I10 Essential (primary) hypertension: Secondary | ICD-10-CM | POA: Diagnosis not present

## 2023-12-22 DIAGNOSIS — Z1339 Encounter for screening examination for other mental health and behavioral disorders: Secondary | ICD-10-CM | POA: Diagnosis not present

## 2023-12-22 DIAGNOSIS — I2510 Atherosclerotic heart disease of native coronary artery without angina pectoris: Secondary | ICD-10-CM | POA: Diagnosis not present

## 2024-01-04 DIAGNOSIS — I10 Essential (primary) hypertension: Secondary | ICD-10-CM | POA: Diagnosis not present

## 2024-01-06 ENCOUNTER — Other Ambulatory Visit (HOSPITAL_BASED_OUTPATIENT_CLINIC_OR_DEPARTMENT_OTHER): Payer: Self-pay | Admitting: Internal Medicine

## 2024-01-06 DIAGNOSIS — E7800 Pure hypercholesterolemia, unspecified: Secondary | ICD-10-CM

## 2024-01-12 DIAGNOSIS — M8589 Other specified disorders of bone density and structure, multiple sites: Secondary | ICD-10-CM | POA: Diagnosis not present

## 2024-01-12 LAB — HM DEXA SCAN: HM Dexa Scan: NORMAL

## 2024-01-13 ENCOUNTER — Telehealth: Payer: Self-pay | Admitting: Gastroenterology

## 2024-01-13 ENCOUNTER — Other Ambulatory Visit: Payer: Self-pay

## 2024-01-13 ENCOUNTER — Encounter: Payer: Self-pay | Admitting: Cardiovascular Disease

## 2024-01-13 NOTE — Telephone Encounter (Signed)
 Spoke with the Loretta Austin and she tells me that she was set up for appt with Dr Dominic Friendly and did not need a call back. She will keep appt as planned to discuss if she should stay on budesonide .  She did stop about 6 months ago and states she does have some issues at times with fecal incontinence.  She states she takes bismuth and that seems to help for now. She will not make any changes at this time and keep appt as planned.  I did let he know she could try imodium if needed and titrate to avoid constipation.  The Loretta Austin has been advised of the information and verbalized understanding.

## 2024-01-13 NOTE — Progress Notes (Unsigned)
 Cardiology Office Note:    Date:  01/14/2024   ID:  Loretta Austin, DOB April 23, 1946, MRN [id number]  PCP:  Bertha Broad, MD  [facility] Cardiologist:  Shivam Mestas  [facility] Electrophysiologist:  None   Referring MD: Bertha Broad, MD   Chief Complaint  Patient presents with   Hypertension        coronary artery calcifiation     History of Present Illness:    Loretta Austin is a 78 y.o. female with a hx of HTN.  We were asked to see her by Dr. Dianne Fortune for further evaluation of her coronary artery calcifications.  Has a strong family hx  Father has CAD Brother has stents Mother has AAA .  She is moderately active.   Water aerobic 3 times a day Walks her dog but no real exercise  No angina .  No syncope , no presyncope   Labs from [facility] shows Chol = 239 HDL = 67 LDL = 148 Trigs = 119   Has been on Losartan  + HCTZ but was found to be very hyponatremic ( was admitted with food poisoning)   Also had acute / chronic kidney disease -  Cre = 2.23 in the setting of food poisoning   Creatinine has normalized now   BP is borderline elevated  She admits to eating more salt   November 23, 2020: Loretta Austin is seen today for a follow-up of her hypertension and coronary artery calcifications.  She has a very strong family history of coronary artery disease.  She has not had any symptoms of angina. Coronary calcium  score of 518 Agatston units. This was 89th percentile for age and sex matched control.  Is not having any coronary symptoms  - no angina  Her creatinine has improved  - is now 0.82. We have not done a stress test.  She does not do much exercise  -  [facility]   Has some DOE if she does too much exercise    December 24, 2021 Loretta Austin is seen today for follow up of her HTN, CAC Myoview  in march 2022 was normal   Had COVID in Feb.  ( Has had  Then had a nonspecific URI . Has a new dog.   Walks a 20 min mile  No  CP or dyspnea.   January 14, 2024 Loretta Austin is seen for follow up of her HTN, CAC   Still walking regularly  Will start going to [place] in several months   Dr. Adan Holms had scheduled a coronary calcium  score.  I reminded her that she has had a coronary calcium  score in September, 2021.  Her coronary calcium  score was 518.  We discussed what a score of 519 means.  Her LDL goal is less than 70.  Her most recent LDL which was from 1 month ago is 57.  She is currently at goal with her lipids.  She is not having any symptoms.  I encouraged her to remain active and to continue exercising.  Unless she has symptoms I do not think that we would do anything further.  I have recommended that she cancel the coronary calcium  score and save the $99.    Past Medical History:  Diagnosis Date   Arthritis    Cholecystolithiasis    Colon polyps    Diverticulosis    Food poisoning    GERD (gastroesophageal reflux disease)    High blood pressure  Microscopic colitis    PAD (peripheral artery disease) (HCC) 01/16/2023   Lower extremity arterial Dopplers 02/06/2023: Right-no stenosis, mild-moderate atherosclerosis throughout especially in CFA and calf vessels  ABIs 02/06/2023: Right 0.91, left 1.12   Sjogren's disease (HCC)    Sleep apnea     Past Surgical History:  Procedure Laterality Date   COLONOSCOPY  04/19/2021   02/12/16-at [place]   TONSILLECTOMY AND ADENOIDECTOMY     TUBAL LIGATION      Current Medications: Current Meds  Medication Sig   adalimumab (HUMIRA, 2 PEN,) 40 MG/0.4ML pen 0.4 mL Subcutaneous every 2 weeks   AMBULATORY NON FORMULARY MEDICATION CBD tablets Take one tablet by mouth once daily   amLODipine (NORVASC) 5 MG tablet Take 5 mg by mouth daily.   Bacillus Coagulans-Inulin (ALIGN PREBIOTIC-PROBIOTIC) 5-1.25 MG-GM CHEW See admin instructions.   Biotin 16109 MCG TABS Take 1 tablet by mouth daily.   calcium  citrate-vitamin D 500-500 MG-UNIT chewable tablet Chew 1 tablet by  mouth once a week.   Cyanocobalamin  (VITAMIN B-12 PO) Take 2.5 mg by mouth daily.   fluticasone (FLONASE) 50 MCG/ACT nasal spray Place 1-2 sprays into both nostrils daily.   folic acid  (FOLVITE ) 800 MCG tablet Take 800 mcg by mouth daily.   irbesartan (AVAPRO) 150 MG tablet Take 150 mg by mouth daily.   loratadine (CLARITIN) 10 MG tablet Take 10 mg by mouth in the morning and at bedtime.    Magnesium 400 MG TABS Take 1 tablet by mouth daily in the afternoon.   Melatonin 10 MG TABS Take 10 mg by mouth daily.   Multiple Vitamins-Iron (MULTIVITAMIN/IRON PO) Take 1 tablet by mouth daily.   nitrofurantoin (MACRODANTIN) 100 MG capsule Take 100 mg by mouth at bedtime.   omeprazole (PRILOSEC) 20 MG capsule Take 20 mg by mouth daily.   rosuvastatin  (CRESTOR ) 10 MG tablet Take 1 tablet (10 mg total) by mouth daily.   Current Facility-Administered Medications for the 01/14/24 encounter (Office Visit) with Ladawna Walgren, Lela Purple, MD  Medication   0.9 %  sodium chloride  infusion     Allergies:   Ciprofloxacin and Metronidazole   Social History   Socioeconomic History   Marital status: Widowed    Spouse name: Not on file   Number of children: 1   Years of education: Not on file   Highest education level: Not on file  Occupational History   Occupation: retired  Tobacco Use   Smoking status: Former    Types: Cigarettes   Smokeless tobacco: Never  Vaping Use   Vaping status: Never Used  Substance and Sexual Activity   Alcohol  use: Yes    Alcohol /week: 1.0 standard drink of alcohol     Types: 1 Standard drinks or equivalent per week   Drug use: Not Currently   Sexual activity: Not Currently    Partners: Male  Other Topics Concern   Not on file  Social History Narrative   Not on file   Social Drivers of Health   Financial Resource Strain: Not on file  Food Insecurity: Not on file  Transportation Needs: Not on file  Physical Activity: Not on file  Stress: Not on file  Social Connections:  Not on file     Family History: The patient's family history includes Colon cancer (age of onset: 62) in her mother; Lung cancer in her mother. There is no history of Esophageal cancer, Rectal cancer, Stomach cancer, or Colon polyps.  ROS:   Please see the history of  present illness.     All other systems reviewed and are negative.  EKGs/Labs/Other Studies Reviewed:    The following studies were reviewed today:     Recent Labs: No results found for requested labs within last 365 days.  Recent Lipid Panel No results found for: "CHOL", "TRIG", "HDL", "CHOLHDL", "VLDL", "LDLCALC", "LDLDIRECT"  Physical Exam:     Physical Exam: Blood pressure 134/68, pulse 77, height 5\' 5"  (1.651 m), weight 165 lb (74.8 kg), SpO2 97%.       GEN:  Well nourished, well developed in no acute distress HEENT: Normal NECK: No JVD; No carotid bruits LYMPHATICS: No lymphadenopathy CARDIAC: RRR , no murmurs, rubs, gallops RESPIRATORY:  Clear to auscultation without rales, wheezing or rhonchi  ABDOMEN: Soft, non-tender, non-distended MUSCULOSKELETAL:  No edema; No deformity  SKIN: Warm and dry NEUROLOGIC:  Alert and oriented x 3     EKG:    EKG Interpretation Date/Time:  Thursday January 14 2024 09:43:51 EDT Ventricular Rate:  70 PR Interval:  244 QRS Duration:  76 QT Interval:  376 QTC Calculation: 406 R Axis:   -31  Text Interpretation: Sinus rhythm with 1st degree A-V block Left axis deviation No previous ECGs available Confirmed by Ahmad Alert ([id number]) on 01/14/2024 10:05:45 AM      ASSESSMENT:    1. Primary hypertension   2. Hyperlipidemia LDL goal <70      PLAN:       Coronary artery calcifications:    Her coronary calcium  score is 518.  She is not having any angina.  Her LDL goal is less than 70.  Her last LDL was 57.  I have encouraged her to continue with her current medications and to let us  know if she starts to develop any episodes of chest pain or chest pressure.      2.  Hypertension:    Blood pressure seems to be well-controlled.  Her blood pressures elevated frequently in Dr. Rosellen Conners office.  He has ordered a 24-hour ambulatory blood pressure monitor.   Medication Adjustments/Labs and Tests Ordered: Current medicines are reviewed at length with the patient today.  Concerns regarding medicines are outlined above.  Orders Placed This Encounter  Procedures   EKG 12-Lead   No orders of the defined types were placed in this encounter.    Patient Instructions  Medication Instructions:   *If you need a refill on your cardiac medications before your next appointment, please call your pharmacy*  Lab Work:  If you have labs (blood work) drawn today and your tests are completely normal, you will receive your results only by: MyChart Message (if you have MyChart) OR A paper copy in the mail If you have any lab test that is abnormal or we need to change your treatment, we will call you to review the results.  Testing/Procedures:   Follow-Up: At [facility], you and your health needs are our priority.  As part of our continuing mission to provide you with exceptional heart care, our providers are all part of one team.  This team includes your primary Cardiologist (physician) and Advanced Practice Providers or APPs (Physician Assistants and Nurse Practitioners) who all work together to provide you with the care you need, when you need it.  Your next appointment:   1 year(s)  Provider:  one year with general cardiologist   We recommend signing up for the patient portal called "MyChart".  Sign up information is provided on this After Visit Summary.  MyChart  is used to connect with patients for Virtual Visits (Telemedicine).  Patients are able to view lab/test results, encounter notes, upcoming appointments, etc.  Non-urgent messages can be sent to your provider as well.   To learn more about what you can do with MyChart, go to  [email or web address].   Other Instructions       1st Floor: - Lobby - Registration  - Pharmacy  - Lab - Cafe  2nd Floor: - PV Lab - Diagnostic Testing (echo, CT, nuclear med)  3rd Floor: - Vacant  4th Floor: - TCTS (cardiothoracic surgery) - AFib Clinic - Structural Heart Clinic - Vascular Surgery  - Vascular Ultrasound  5th Floor: - HeartCare Cardiology (general and EP) - Clinical Pharmacy for coumadin, hypertension, lipid, weight-loss medications, and med management appointments    Valet parking services will be available as well.      Signed, Ahmad Alert, MD  01/14/2024 10:05 AM     [facility]

## 2024-01-13 NOTE — Telephone Encounter (Signed)
 Patient called and stated that she would like to speak to the nurse regarding her collagenous colitis. Patient is requesting a call back. Please advise.

## 2024-01-14 ENCOUNTER — Encounter: Payer: Self-pay | Admitting: Cardiovascular Disease

## 2024-01-14 ENCOUNTER — Ambulatory Visit: Payer: 59 | Attending: Cardiovascular Disease | Admitting: Cardiovascular Disease

## 2024-01-14 VITALS — BP 134/68 | HR 77 | Ht 65.0 in | Wt 165.0 lb

## 2024-01-14 DIAGNOSIS — E785 Hyperlipidemia, unspecified: Secondary | ICD-10-CM | POA: Diagnosis not present

## 2024-01-14 DIAGNOSIS — I10 Essential (primary) hypertension: Secondary | ICD-10-CM | POA: Diagnosis not present

## 2024-01-14 NOTE — Patient Instructions (Signed)
 Medication Instructions:   *If you need a refill on your cardiac medications before your next appointment, please call your pharmacy*  Lab Work:  If you have labs (blood work) drawn today and your tests are completely normal, you will receive your results only by: MyChart Message (if you have MyChart) OR A paper copy in the mail If you have any lab test that is abnormal or we need to change your treatment, we will call you to review the results.  Testing/Procedures:   Follow-Up: At [facility], you and your health needs are our priority.  As part of our continuing mission to provide you with exceptional heart care, our providers are all part of one team.  This team includes your primary Cardiologist (physician) and Advanced Practice Providers or APPs (Physician Assistants and Nurse Practitioners) who all work together to provide you with the care you need, when you need it.  Your next appointment:   1 year(s)  Provider:  one year with general cardiologist   We recommend signing up for the patient portal called "MyChart".  Sign up information is provided on this After Visit Summary.  MyChart is used to connect with patients for Virtual Visits (Telemedicine).  Patients are able to view lab/test results, encounter notes, upcoming appointments, etc.  Non-urgent messages can be sent to your provider as well.   To learn more about what you can do with MyChart, go to [email or web address].   Other Instructions       1st Floor: - Lobby - Registration  - Pharmacy  - Lab - Cafe  2nd Floor: - PV Lab - Diagnostic Testing (echo, CT, nuclear med)  3rd Floor: - Vacant  4th Floor: - TCTS (cardiothoracic surgery) - AFib Clinic - Structural Heart Clinic - Vascular Surgery  - Vascular Ultrasound  5th Floor: - HeartCare Cardiology (general and EP) - Clinical Pharmacy for coumadin, hypertension, lipid, weight-loss medications, and med management appointments    Valet  parking services will be available as well.

## 2024-01-15 DIAGNOSIS — Z85828 Personal history of other malignant neoplasm of skin: Secondary | ICD-10-CM | POA: Diagnosis not present

## 2024-01-15 DIAGNOSIS — L820 Inflamed seborrheic keratosis: Secondary | ICD-10-CM | POA: Diagnosis not present

## 2024-01-15 DIAGNOSIS — L570 Actinic keratosis: Secondary | ICD-10-CM | POA: Diagnosis not present

## 2024-01-28 DIAGNOSIS — I10 Essential (primary) hypertension: Secondary | ICD-10-CM | POA: Diagnosis not present

## 2024-02-01 DIAGNOSIS — I10 Essential (primary) hypertension: Secondary | ICD-10-CM | POA: Diagnosis not present

## 2024-02-02 DIAGNOSIS — I10 Essential (primary) hypertension: Secondary | ICD-10-CM | POA: Diagnosis not present

## 2024-02-02 LAB — BASIC METABOLIC PANEL WITH GFR: EGFR: 69.7

## 2024-03-01 ENCOUNTER — Encounter: Payer: Self-pay | Admitting: Gastroenterology

## 2024-03-01 ENCOUNTER — Other Ambulatory Visit (HOSPITAL_BASED_OUTPATIENT_CLINIC_OR_DEPARTMENT_OTHER)

## 2024-03-01 ENCOUNTER — Ambulatory Visit (INDEPENDENT_AMBULATORY_CARE_PROVIDER_SITE_OTHER): Admitting: Gastroenterology

## 2024-03-01 VITALS — BP 134/78 | HR 72 | Ht 65.0 in | Wt 162.0 lb

## 2024-03-01 DIAGNOSIS — K8020 Calculus of gallbladder without cholecystitis without obstruction: Secondary | ICD-10-CM | POA: Diagnosis not present

## 2024-03-01 DIAGNOSIS — R634 Abnormal weight loss: Secondary | ICD-10-CM | POA: Diagnosis not present

## 2024-03-01 DIAGNOSIS — R110 Nausea: Secondary | ICD-10-CM | POA: Diagnosis not present

## 2024-03-01 DIAGNOSIS — K529 Noninfective gastroenteritis and colitis, unspecified: Secondary | ICD-10-CM

## 2024-03-01 DIAGNOSIS — K52831 Collagenous colitis: Secondary | ICD-10-CM | POA: Diagnosis not present

## 2024-03-01 MED ORDER — NA SULFATE-K SULFATE-MG SULF 17.5-3.13-1.6 GM/177ML PO SOLN
1.0000 | Freq: Once | ORAL | 0 refills | Status: AC
Start: 2024-03-01 — End: 2024-03-01

## 2024-03-01 MED ORDER — NA SULFATE-K SULFATE-MG SULF 17.5-3.13-1.6 GM/177ML PO SOLN
1.0000 | Freq: Once | ORAL | 0 refills | Status: DC
Start: 2024-03-01 — End: 2024-03-01

## 2024-03-01 NOTE — Progress Notes (Signed)
 Loretta Austin  Chief Complaint: Collagenous colitis  Subjective  Prior history  Seen in 2023 for longstanding collagenous colitis on budesonide  Last colonoscopy with Dr. Dominic Austin July 2022 or family history of colon cancer.  6 subcentimeter tubular adenomas removed (3-year recall recommended).  Patient was on budesonide  3 mg daily at that time with reported good control of diarrhea, though biopsies still show changes of collagenous colitis. Office visit May 2024 for collagenous colitis and also following up an episode of uncomplicated diverticulitis shortly before that that resolved with Augmentin I referred her to Loretta Austin surgery for consideration of cholecystectomy due to the gallstones on imaging, and they felt surgery was not indicated at that time.   Discussed the use of Loretta Austin software for clinical Austin transcription with the patient, who gave verbal consent to proceed.  History of Present Illness Loretta Austin "Loretta Austin" is a 78 year old female with microscopic colitis who presents with persistent diarrhea despite budesonide  treatment.  She has been experiencing significant diarrhea since being off budesonide  for eight months. She takes an Imodium at night, which allows her to sleep through the night; otherwise, she wakes up at 3 AM due to symptoms. The diarrhea persists through the morning, impacting her daily activities and causing her to stay at home most of the time.  She has a history of microscopic colitis and has been on budesonide  for ten years. She previously reduced her dose to as low as three milligrams but recalls that a nine milligram daily dose was effective in controlling her symptoms.  She experiences occasional nausea in the morning, feeling as though she might vomit, but there is no associated pain. She wonders if that could be attributed to her gallbladder. No difficulty swallowing or food getting stuck.  Denies rectal bleeding, dysphagia  vomiting or weight loss Stopped omeprazole because she did not feel she needed it anymore.  ROS: Cardiovascular:  no chest pain Respiratory: no dyspnea  The patient's Past Medical, Family and Social History were reviewed and are on file in the EMR. Past Medical History:  Diagnosis Date   Arthritis    Cholecystolithiasis    Colon polyps    Diverticulosis    Food poisoning    GERD (gastroesophageal reflux disease)    High blood pressure    Microscopic colitis    PAD (peripheral artery disease) (HCC) 01/16/2023   Lower extremity arterial Dopplers 02/06/2023: Right-no stenosis, mild-moderate atherosclerosis throughout especially in CFA and calf vessels  ABIs 02/06/2023: Right 0.91, left 1.12   Sjogren's disease (HCC)    Sleep apnea     Past Surgical History:  Procedure Laterality Date   COLONOSCOPY  04/19/2021   02/12/16-at [place]   TONSILLECTOMY AND ADENOIDECTOMY     TUBAL LIGATION       Objective:  Med list reviewed  Current Outpatient Medications:    adalimumab (HUMIRA, 2 PEN,) 40 MG/0.4ML pen, 0.4 mL Subcutaneous every 2 weeks, Disp: , Rfl:    Bacillus Coagulans-Inulin (ALIGN PREBIOTIC-PROBIOTIC) 5-1.25 MG-GM CHEW, See admin instructions., Disp: , Rfl:    Biotin 16109 MCG TABS, Take 1 tablet by mouth daily., Disp: , Rfl:    calcium  citrate-vitamin D 500-500 MG-UNIT chewable tablet, Chew 1 tablet by mouth once a week., Disp: , Rfl:    Cholecalciferol (VITAMIN D3) 1.25 MG (50000 UT) CAPS, Take 1 capsule by mouth once a week., Disp: , Rfl:    Cyanocobalamin  (VITAMIN B-12 PO), Take 2.5 mg by mouth daily.,  Disp: , Rfl:    fluticasone (FLONASE) 50 MCG/ACT nasal spray, Place 1-2 sprays into both nostrils daily., Disp: , Rfl:    folic acid  (FOLVITE ) 800 MCG tablet, Take 800 mcg by mouth daily., Disp: , Rfl:    irbesartan (AVAPRO) 150 MG tablet, Take 150 mg by mouth daily., Disp: , Rfl:    loperamide (IMODIUM A-D) 2 MG tablet, Take 2 mg by mouth at bedtime., Disp: , Rfl:     Magnesium 400 MG TABS, Take 1 tablet by mouth daily in the afternoon., Disp: , Rfl:    Melatonin 10 MG TABS, Take 10 mg by mouth daily., Disp: , Rfl:    Multiple Vitamins-Iron (MULTIVITAMIN/IRON PO), Take 1 tablet by mouth daily., Disp: , Rfl:    nitrofurantoin (MACRODANTIN) 100 MG capsule, Take 100 mg by mouth at bedtime., Disp: , Rfl:    RESTASIS  0.05 % ophthalmic emulsion, Place 1 drop into both eyes 2 (two) times daily., Disp: , Rfl:    rosuvastatin  (CRESTOR ) 10 MG tablet, Take 1 tablet (10 mg total) by mouth daily., Disp: 90 tablet, Rfl: 3   loratadine (CLARITIN) 10 MG tablet, Take 10 mg by mouth in the morning and at bedtime.  (Patient not taking: Reported on 03/01/2024), Disp: , Rfl:    Na Sulfate-K Sulfate-Mg Sulfate concentrate (SUPREP) 17.5-3.13-1.6 GM/177ML SOLN, Take 1 kit (354 mLs total) by mouth once for 1 dose., Disp: 354 mL, Rfl: 0   omeprazole (PRILOSEC) 20 MG capsule, Take 20 mg by mouth daily. (Patient not taking: Reported on 03/01/2024), Disp: , Rfl:   Current Facility-Administered Medications:    0.9 %  sodium chloride  infusion, 500 mL, Intravenous, Once, Loretta Austin, Loretta Clarity III, MD   Vital signs in last 24 hrs: Vitals:   03/01/24 1046  BP: 134/78  Pulse: 72   Wt Readings from Last 3 Encounters:  03/01/24 162 lb (73.5 kg)  01/14/24 165 lb (74.8 kg)  01/21/23 169 lb (76.7 kg)    Physical Exam  Well-appearing HEENT: sclera anicteric, oral mucosa moist without lesions Neck: supple, no thyromegaly, JVD or lymphadenopathy Cardiac: Regular without appreciable murmur,  no peripheral edema Pulm: clear to auscultation bilaterally, normal RR and effort noted Abdomen: soft, no tenderness, with active bowel sounds. No guarding or palpable hepatosplenomegaly. Skin; warm and dry, no jaundice or rash   Labs:   ___________________________________________ Radiologic  studies:   ____________________________________________ Other:   _____________________________________________   Encounter Diagnoses  Name Primary?   Collagenous colitis Yes   Chronic diarrhea    Weight loss    Calculus of gallbladder without cholecystitis without obstruction    Nausea in adult     Assessment and Plan Assessment & Plan Microscopic colitis Persistent diarrhea despite budesonide  treatment suggests inadequate control, necessitating dose adjustment. - Resume budesonide  9 mg daily for 3-4 weeks, then send me a portal message with how it is working and we can decide about the dosing.  If doing well, possibly drop dose down to 6 mg a day but maybe not below that given her previous history. - Evaluate response after 4 weeks; consider dose reduction if symptoms improve.  Nausea Intermittent nausea possibly related to gallbladder issues; differential includes upper GI tract issues. - EGD planned and she was agreeable after discussion of procedure risks  Personal history colon polyps along with family history of colorectal cancer, due for surveillance colonoscopy Depending on reported diarrhea at that time, might also take biopsies as before to see about colitis activity.  She was agreeable  to EGD and colonoscopy after discussion of procedure and risks  The benefits and risks of the planned procedure(s) were described in detail with the patient or (when appropriate) their health care proxy.  Risks were outlined as including, but not limited to, bleeding, infection, perforation, adverse medication reaction leading to cardiac or pulmonary decompensation, pancreatitis (if ERCP).  The limitation of incomplete mucosal visualization was also discussed.  No guarantees or warranties were given.     Kerby Pearson Austin

## 2024-03-01 NOTE — Patient Instructions (Addendum)
 We have sent the following medications to your pharmacy for you to pick up at your convenience: Suprep  You have been scheduled for an endoscopy and colonoscopy. Please follow the written instructions given to you at your visit today.  If you use inhalers (even only as needed), please bring them with you on the day of your procedure.  DO NOT TAKE 7 DAYS PRIOR TO TEST- Trulicity (dulaglutide) Ozempic, Wegovy (semaglutide) Mounjaro (tirzepatide) Bydureon Bcise (exanatide extended release)  DO NOT TAKE 1 DAY PRIOR TO YOUR TEST Rybelsus (semaglutide) Adlyxin (lixisenatide) Victoza (liraglutide) Byetta (exanatide) ___________________________________________________________________________   _______________________________________________________  If your blood pressure at your visit was 140/90 or greater, please contact your primary care physician to follow up on this.  _______________________________________________________  If you are age 63 or older, your body mass index should be between 23-30. Your Body mass index is 26.96 kg/m. If this is out of the aforementioned range listed, please consider follow up with your Primary Care Provider.  If you are age 54 or younger, your body mass index should be between 19-25. Your Body mass index is 26.96 kg/m. If this is out of the aformentioned range listed, please consider follow up with your Primary Care Provider.   ________________________________________________________  The [facility] providers would like to encourage you to use MYCHART to communicate with providers for non-urgent requests or questions.  Due to long hold times on the telephone, sending your provider a message by [facility] may be a faster and more efficient way to get a response.  Please allow 48 business hours for a response.  Please remember that this is for non-urgent requests.  _______________________________________________________  Thank you for trusting me with your  gastrointestinal care!    Kerby Pearson, MD

## 2024-03-02 ENCOUNTER — Other Ambulatory Visit: Payer: Self-pay | Admitting: Gastroenterology

## 2024-03-09 ENCOUNTER — Other Ambulatory Visit (HOSPITAL_COMMUNITY): Payer: Self-pay

## 2024-03-10 ENCOUNTER — Ambulatory Visit (HOSPITAL_COMMUNITY)
Admission: RE | Admit: 2024-03-10 | Discharge: 2024-03-10 | Disposition: A | Discharge status: Home / Self Care | Source: Ambulatory Visit | Attending: Internal Medicine | Admitting: Internal Medicine

## 2024-03-10 DIAGNOSIS — M810 Age-related osteoporosis without current pathological fracture: Secondary | ICD-10-CM | POA: Diagnosis not present

## 2024-03-10 MED ORDER — DENOSUMAB 60 MG/ML ~~LOC~~ SOSY
PREFILLED_SYRINGE | SUBCUTANEOUS | Status: AC
  Filled 2024-03-10: qty 1

## 2024-03-10 MED ORDER — DENOSUMAB 60 MG/ML ~~LOC~~ SOSY
60.0000 mg | PREFILLED_SYRINGE | Freq: Once | SUBCUTANEOUS | Status: AC
  Administered 2024-03-10: 60 mg via SUBCUTANEOUS

## 2024-03-23 DIAGNOSIS — L720 Epidermal cyst: Secondary | ICD-10-CM | POA: Diagnosis not present

## 2024-03-23 DIAGNOSIS — Z85828 Personal history of other malignant neoplasm of skin: Secondary | ICD-10-CM | POA: Diagnosis not present

## 2024-03-23 DIAGNOSIS — L820 Inflamed seborrheic keratosis: Secondary | ICD-10-CM | POA: Diagnosis not present

## 2024-03-23 DIAGNOSIS — L821 Other seborrheic keratosis: Secondary | ICD-10-CM | POA: Diagnosis not present

## 2024-03-23 DIAGNOSIS — L570 Actinic keratosis: Secondary | ICD-10-CM | POA: Diagnosis not present

## 2024-04-19 NOTE — Procedures (Signed)
Mask fit

## 2024-04-21 ENCOUNTER — Ambulatory Visit: Admitting: Gastroenterology

## 2024-04-21 ENCOUNTER — Encounter: Payer: Self-pay | Admitting: Gastroenterology

## 2024-04-21 VITALS — BP 105/67 | HR 66 | Temp 97.6°F | Resp 15 | Ht 65.0 in | Wt 162.0 lb

## 2024-04-21 DIAGNOSIS — Z860100 Personal history of colon polyps, unspecified: Secondary | ICD-10-CM

## 2024-04-21 DIAGNOSIS — K648 Other hemorrhoids: Secondary | ICD-10-CM

## 2024-04-21 DIAGNOSIS — K52831 Collagenous colitis: Secondary | ICD-10-CM | POA: Diagnosis not present

## 2024-04-21 DIAGNOSIS — R110 Nausea: Secondary | ICD-10-CM | POA: Diagnosis not present

## 2024-04-21 DIAGNOSIS — K529 Noninfective gastroenteritis and colitis, unspecified: Secondary | ICD-10-CM

## 2024-04-21 DIAGNOSIS — K635 Polyp of colon: Secondary | ICD-10-CM | POA: Diagnosis not present

## 2024-04-21 DIAGNOSIS — R634 Abnormal weight loss: Secondary | ICD-10-CM | POA: Diagnosis not present

## 2024-04-21 DIAGNOSIS — K5730 Diverticulosis of large intestine without perforation or abscess without bleeding: Secondary | ICD-10-CM | POA: Diagnosis not present

## 2024-04-21 DIAGNOSIS — Z860101 Personal history of adenomatous and serrated colon polyps: Secondary | ICD-10-CM

## 2024-04-21 DIAGNOSIS — D122 Benign neoplasm of ascending colon: Secondary | ICD-10-CM | POA: Diagnosis not present

## 2024-04-21 DIAGNOSIS — D120 Benign neoplasm of cecum: Secondary | ICD-10-CM

## 2024-04-21 DIAGNOSIS — I10 Essential (primary) hypertension: Secondary | ICD-10-CM | POA: Diagnosis not present

## 2024-04-21 DIAGNOSIS — D123 Benign neoplasm of transverse colon: Secondary | ICD-10-CM | POA: Diagnosis not present

## 2024-04-21 DIAGNOSIS — K6389 Other specified diseases of intestine: Secondary | ICD-10-CM | POA: Diagnosis not present

## 2024-04-21 DIAGNOSIS — E785 Hyperlipidemia, unspecified: Secondary | ICD-10-CM | POA: Diagnosis not present

## 2024-04-21 DIAGNOSIS — Z1211 Encounter for screening for malignant neoplasm of colon: Secondary | ICD-10-CM | POA: Diagnosis not present

## 2024-04-21 DIAGNOSIS — G4730 Sleep apnea, unspecified: Secondary | ICD-10-CM | POA: Diagnosis not present

## 2024-04-21 MED ORDER — SODIUM CHLORIDE 0.9 % IV SOLN: 500.0000 mL | Freq: Once | INTRAVENOUS | Status: DC

## 2024-04-21 NOTE — Patient Instructions (Signed)
 Resume previous diet.  Continue present medications. Awaiting pathology results. Repeat colonoscopy may be recommended for surveillance. That will be determined after pathology results for today's exam become available for review.  Handouts provided on polyps, diverticulosis, and hemorrhoids.   YOU HAD AN ENDOSCOPIC PROCEDURE TODAY AT THE [facility]:   Refer to the procedure report that was given to you for any specific questions about what was found during the examination.  If the procedure report does not answer your questions, please call your gastroenterologist to clarify.  If you requested that your care partner not be given the details of your procedure findings, then the procedure report has been included in a sealed envelope for you to review at your convenience later.  YOU SHOULD EXPECT: Some feelings of bloating in the abdomen. Passage of more gas than usual.  Walking can help get rid of the air that was put into your GI tract during the procedure and reduce the bloating. If you had a lower endoscopy (such as a colonoscopy or flexible sigmoidoscopy) you may notice spotting of blood in your stool or on the toilet paper. If you underwent a bowel prep for your procedure, you may not have a normal bowel movement for a few days.  Please Note:  You might notice some irritation and congestion in your nose or some drainage.  This is from the oxygen used during your procedure.  There is no need for concern and it should clear up in a day or so.  SYMPTOMS TO REPORT IMMEDIATELY:  Following lower endoscopy (colonoscopy or flexible sigmoidoscopy):  Excessive amounts of blood in the stool  Significant tenderness or worsening of abdominal pains  Swelling of the abdomen that is new, acute  Fever of 100F or higher  Following upper endoscopy (EGD)  Vomiting of blood or coffee ground material  New chest pain or pain under the shoulder blades  Painful or persistently difficult  swallowing  New shortness of breath  Fever of 100F or higher  Black, tarry-looking stools  For urgent or emergent issues, a gastroenterologist can be reached at any hour by calling [phone]. Do not use MyChart messaging for urgent concerns.    DIET:  We do recommend a small meal at first, but then you may proceed to your regular diet.  Drink plenty of fluids but you should avoid alcoholic beverages for 24 hours.  ACTIVITY:  You should plan to take it easy for the rest of today and you should NOT DRIVE or use heavy machinery until tomorrow (because of the sedation medicines used during the test).    FOLLOW UP: Our staff will call the number listed on your records the next business day following your procedure.  We will call around 7:15- 8:00 am to check on you and address any questions or concerns that you may have regarding the information given to you following your procedure. If we do not reach you, we will leave a message.     If any biopsies were taken you will be contacted by phone or by letter within the next 1-3 weeks.  Please call us  at [phone] if you have not heard about the biopsies in 3 weeks.    SIGNATURES/CONFIDENTIALITY: You and/or your care partner have signed paperwork which will be entered into your electronic medical record.  These signatures attest to the fact that that the information above on your After Visit Summary has been reviewed and is understood.  Full responsibility of the confidentiality  of this discharge information lies with you and/or your care-partner.

## 2024-04-21 NOTE — Op Note (Signed)
 [facility] Patient Name: Loretta Austin Procedure Date: 04/21/2024 8:54 AM MRN: [id number] Endoscopist: Victory L. Legrand , MD, [id number] Age: 78 Referring MD:  Date of Birth: [birth year] Gender: Female Account #: [id number] Procedure:                Colonoscopy Indications:              Personal history of colonic polyps; family history                            of colon cancer                           6 subcentimeter tubular adenomas July 2022                           This patient also has collagenous colitis with                            intermittent diarrhea, currently on budesonide  3 mg                            daily -condition was still present on biopsies last                            colonoscopy Medicines:                Monitored Anesthesia Care Procedure:                Pre-Anesthesia Assessment:                           - Prior to the procedure, a History and Physical                            was performed, and patient medications and                            allergies were reviewed. The patient's tolerance of                            previous anesthesia was also reviewed. The risks                            and benefits of the procedure and the sedation                            options and risks were discussed with the patient.                            All questions were answered, and informed consent                            was obtained. Prior Anticoagulants: The patient has  taken no anticoagulant or antiplatelet agents. ASA                            Grade Assessment: III - A patient with severe                            systemic disease. After reviewing the risks and                            benefits, the patient was deemed in satisfactory                            condition to undergo the procedure.                           After obtaining informed consent, the colonoscope                            was  passed under direct vision. Throughout the                            procedure, the patient's blood pressure, pulse, and                            oxygen saturations were monitored continuously. The                            CF [id number] #[id number] was introduced through the anus                            and advanced to the the terminal ileum, with                            identification of the appendiceal orifice and IC                            valve. The colonoscopy was performed without                            difficulty. The patient tolerated the procedure                            well. The quality of the bowel preparation was                            generally good after lavage (except for some                            scattered fibrous debris and diverticular stool                            balls in the left colon). The terminal ileum,  ileocecal valve, appendiceal orifice, and rectum                            were photographed. Scope In: 9:15:27 AM Scope Out: 9:29:06 AM Scope Withdrawal Time: 0 hours 10 minutes 28 seconds  Total Procedure Duration: 0 hours 13 minutes 39 seconds  Findings:                 The perianal and digital rectal examinations were                            normal.                           The terminal ileum appeared normal.                           Normal mucosa was found in the entire colon.                            Biopsies for histology were taken with a cold                            forceps from the ascending colon, transverse colon                            and sigmoid colon for evaluation of microscopic                            colitis.                           Repeat examination of right colon under NBI                            performed.                           Multiple diverticula were found from transverse                            colon to sigmoid colon.                           Two  sessile polyps were found in the transverse                            colon and ascending colon. The polyps were 3 to 5                            mm in size. These polyps were removed with a cold                            snare. Resection and retrieval were complete.  Two flat polyps were found in the cecum. The polyps                            were 5 to 10 mm in size. These polyps were removed                            with a cold snare. Resection and retrieval were                            complete.                           Internal hemorrhoids were found.                           The exam was otherwise without abnormality on                            direct and retroflexion views. Complications:            No immediate complications. Estimated Blood Loss:     Estimated blood loss was minimal. Impression:               - The examined portion of the ileum was normal.                           - Normal mucosa in the entire examined colon.                            Biopsied.                           - Diverticulosis from transverse colon to sigmoid                            colon.                           - Two 3 to 5 mm polyps in the transverse colon and                            in the ascending colon, removed with a cold snare.                            Resected and retrieved.                           - Two 5 to 10 mm polyps in the cecum, removed with                            a cold snare. Resected and retrieved.                           - Internal hemorrhoids.                           -  The examination was otherwise normal on direct                            and retroflexion views. Recommendation:           - Patient has a contact number available for                            emergencies. The signs and symptoms of potential                            delayed complications were discussed with the                            patient.  Return to normal activities tomorrow.                            Written discharge instructions were provided to the                            patient.                           - Resume previous diet.                           - Continue present medications.                           - Await pathology results.                           - Repeat colonoscopy may be recommended for                            surveillance. That will be determined after                            pathology results from today's exam become                            available for review.                           - See the other procedure note for documentation of                            additional recommendations. Gurfateh Mcclain L. Legrand, MD 04/21/2024 9:47:57 AM This report has been signed electronically.

## 2024-04-21 NOTE — Progress Notes (Signed)
 Called to room to assist during endoscopic procedure.  Patient ID and intended procedure confirmed with present staff. Received instructions for my participation in the procedure from the performing physician.

## 2024-04-21 NOTE — Progress Notes (Signed)
 Report to PACU, RN, vss, BBS= Clear.

## 2024-04-21 NOTE — Op Note (Signed)
 [facility] Patient Name: Loretta Austin Procedure Date: 04/21/2024 9:02 AM MRN: [id number] Endoscopist: Victory L. Legrand , MD, [id number] Age: 78 Referring MD:  Date of Birth: 12-06-1945 Gender: Female Account #: [id number] Procedure:                Upper GI endoscopy Indications:              Nausea Medicines:                Monitored Anesthesia Care Procedure:                Pre-Anesthesia Assessment:                           - Prior to the procedure, a History and Physical                            was performed, and patient medications and                            allergies were reviewed. The patient's tolerance of                            previous anesthesia was also reviewed. The risks                            and benefits of the procedure and the sedation                            options and risks were discussed with the patient.                            All questions were answered, and informed consent                            was obtained. Prior Anticoagulants: The patient has                            taken no anticoagulant or antiplatelet agents. ASA                            Grade Assessment: III - A patient with severe                            systemic disease. After reviewing the risks and                            benefits, the patient was deemed in satisfactory                            condition to undergo the procedure.                           After obtaining informed consent, the endoscope was  passed under direct vision. Throughout the                            procedure, the patient's blood pressure, pulse, and                            oxygen saturations were monitored continuously. The                            Olympus Scope [id number] was introduced through the                            mouth, and advanced to the second part of duodenum.                            The upper GI endoscopy was  accomplished without                            difficulty. The patient tolerated the procedure                            well. Scope In: Scope Out: Findings:                 The esophagus was normal.                           Essentially normal mucosa except for some scattered                            flecks of heme was found in the entire examined                            stomach. Biopsies were taken with a cold forceps                            for histology. (Antrum and body in same pathology                            jar to rule out H. pylori)                           The cardia and gastric fundus were normal on                            retroflexion. The stomach distended well with                            insufflation.                           Normal mucosa was found in the entire duodenum.                            Biopsies for histology were taken with a cold  forceps for evaluation of celiac disease. Complications:            No immediate complications. Estimated Blood Loss:     Estimated blood loss was minimal. Impression:               - Normal esophagus.                           - Normal mucosa was found in the entire stomach.                            Biopsied.                           - Normal mucosa was found in the entire examined                            duodenum. Biopsied. Recommendation:           - Patient has a contact number available for                            emergencies. The signs and symptoms of potential                            delayed complications were discussed with the                            patient. Return to normal activities tomorrow.                            Written discharge instructions were provided to the                            patient.                           - Resume previous diet.                           - Continue present medications.                           - Await pathology  results.                           - See the other procedure note for documentation of                            additional recommendations. Caydin Yeatts L. Legrand, MD 04/21/2024 9:51:23 AM This report has been signed electronically.

## 2024-04-21 NOTE — Progress Notes (Signed)
 History and Physical:  This patient presents for endoscopic testing for: Encounter Diagnoses  Name Primary?   Collagenous colitis Yes   Weight loss    Nausea in adult    Chronic diarrhea     78 year-old woman known to me with a history of collagenous colitis causing diarrhea, last seen in the office 03/01/2024 with clinical details outlined in that note.  She has ongoing diarrhea requiring budesonide  for control.  Also history of multiple colon polyps with 6 subcentimeter tubular adenomas last colonoscopy July 2022.  She also complains of nausea of unclear cause.  Plan is for upper endoscopy and colonoscopy today.  She reports that stool is mostly formed in budesonide  3 mg daily at this point.  Patient is otherwise without complaints or active issues today.   Past Medical History: Past Medical History:  Diagnosis Date   Arthritis    Cholecystolithiasis    Colon polyps    Diverticulosis    Food poisoning    GERD (gastroesophageal reflux disease)    Heart murmur    High blood pressure    Hyperlipidemia    Microscopic colitis    Osteoporosis    PAD (peripheral artery disease) (HCC) 01/16/2023   Lower extremity arterial Dopplers 02/06/2023: Right-no stenosis, mild-moderate atherosclerosis throughout especially in CFA and calf vessels  ABIs 02/06/2023: Right 0.91, left 1.12   Sjogren's disease (HCC)    Sleep apnea    wears appliance     Past Surgical History: Past Surgical History:  Procedure Laterality Date   COLONOSCOPY  04/19/2021   02/12/16-at [place]   TONSILLECTOMY AND ADENOIDECTOMY     TUBAL LIGATION      Allergies: Allergies  Allergen Reactions   Ciprofloxacin Rash   Metronidazole Rash    Outpatient Meds: Current Outpatient Medications  Medication Sig Dispense Refill   adalimumab (HUMIRA, 2 PEN,) 40 MG/0.4ML pen 0.4 mL Subcutaneous every 2 weeks     Bacillus Coagulans-Inulin (ALIGN PREBIOTIC-PROBIOTIC) 5-1.25 MG-GM CHEW See admin instructions.     Biotin  89999 MCG TABS Take 1 tablet by mouth daily.     budesonide  (ENTOCORT EC ) 3 MG 24 hr capsule Take 3 capsules (9 mg total) by mouth daily. 90 capsule 3   calcium  citrate-vitamin D 500-500 MG-UNIT chewable tablet Chew 1 tablet by mouth once a week.     Cholecalciferol (VITAMIN D3) 1.25 MG (50000 UT) CAPS Take 1 capsule by mouth once a week.     Cyanocobalamin  (VITAMIN B-12 PO) Take 2.5 mg by mouth daily.     denosumab  (PROLIA ) 60 MG/ML SOSY injection once.     folic acid  (FOLVITE ) 800 MCG tablet Take 800 mcg by mouth daily.     IBUPROFEN PO Take by mouth daily.     irbesartan (AVAPRO) 150 MG tablet Take 150 mg by mouth daily.     Magnesium 400 MG TABS Take 1 tablet by mouth daily in the afternoon.     Melatonin 10 MG TABS Take 10 mg by mouth daily.     Multiple Vitamins-Iron (MULTIVITAMIN/IRON PO) Take 1 tablet by mouth daily.     RESTASIS  0.05 % ophthalmic emulsion Place 1 drop into both eyes 2 (two) times daily.     rosuvastatin  (CRESTOR ) 10 MG tablet Take 1 tablet (10 mg total) by mouth daily. 90 tablet 3   fluticasone (FLONASE) 50 MCG/ACT nasal spray Place 1-2 sprays into both nostrils daily.     loperamide (IMODIUM A-D) 2 MG tablet Take 2 mg by mouth  at bedtime.     loratadine (CLARITIN) 10 MG tablet Take 10 mg by mouth in the morning and at bedtime.  (Patient not taking: Reported on 03/01/2024)     omeprazole (PRILOSEC) 20 MG capsule Take 20 mg by mouth daily. (Patient not taking: Reported on 03/01/2024)     Current Facility-Administered Medications  Medication Dose Route Frequency Provider Last Rate Last Admin   0.9 %  sodium chloride  infusion  500 mL Intravenous Once Danis, Victory CROME III, MD       0.9 %  sodium chloride  infusion  500 mL Intravenous Once Danis, Rajan Burgard L III, MD          ___________________________________________________________________ Objective   Exam:  BP (!) 153/90   Pulse 81   Temp 97.6 F (36.4 C)   Ht 5' 5 (1.651 m)   Wt 162 lb (73.5 kg)   SpO2 98%    BMI 26.96 kg/m   CV: regular , S1/S2 Resp: clear to auscultation bilaterally, normal RR and effort noted GI: soft, no tenderness, with active bowel sounds.   Assessment: Encounter Diagnoses  Name Primary?   Collagenous colitis Yes   Weight loss    Nausea in adult    Chronic diarrhea      Plan: Colonoscopy EGD  The benefits and risks of the planned procedure(s) were described in detail with the patient or (when appropriate) their health care proxy.  Risks were outlined as including, but not limited to, bleeding, infection, perforation, adverse medication reaction leading to cardiac or pulmonary decompensation, pancreatitis (if ERCP).  The limitation of incomplete mucosal visualization was also discussed.  No guarantees or warranties were given.  The patient is appropriate for an endoscopic procedure in the ambulatory setting.   - Victory Brand, MD

## 2024-04-22 ENCOUNTER — Telehealth: Payer: Self-pay | Admitting: *Deleted

## 2024-04-22 NOTE — Telephone Encounter (Signed)
 No answer after follow up call. Left message.

## 2024-04-23 ENCOUNTER — Encounter: Payer: Self-pay | Admitting: Gastroenterology

## 2024-04-25 LAB — SURGICAL PATHOLOGY

## 2024-04-26 ENCOUNTER — Ambulatory Visit: Payer: Self-pay | Admitting: Gastroenterology

## 2024-04-27 ENCOUNTER — Encounter: Payer: Self-pay | Admitting: Gastroenterology

## 2024-04-27 NOTE — Telephone Encounter (Signed)
 [name] [address] [place] [address]   Dear [name],       The polyps removed during your recent procedure were found to be adenomatous.  These are considered to be pre-cancerous polyps that may have grown into cancers if they had not been removed.  Based on current nationally recognized surveillance guidelines, I recommend that you consider having a repeat colonoscopy in 3 years. We will place a recall, and at that time evaluate your health and discussed the risks and benefits of a colonoscopy.  Please see an additional chart note for results on the other biopsies from the colonoscopy and upper endoscopy.  If you have any questions or concerns, please don't hesitate to call.  Sincerely,    Loretta LITTIE Legrand DOUGLAS, MD

## 2024-04-27 NOTE — Telephone Encounter (Signed)
 Loretta Austin,   I will send you a separate pathology result letter regarding the polyps.   Briefly, the polyps were a common cancerous type called a tubular adenoma.  I would consider having another surveillance colonoscopy in 3 years if your overall health allows.   The additional colon biopsies showed increase of the collagen thickness in the colon lining consistent with the known collagenous colitis.  However, no inflammation was seen associated with that. As we discussed the day of your procedure, I recommend continuing the budesonide  at current dose rather than discontinue it altogether.  If you should have flareups of the diarrhea, please contact me and we can talk about temporary increases in the dosing.   Biopsies of the duodenum (upper small intestine) were normal.  Specifically, no changes of celiac sprue.   Stomach biopsies did not show H. pylori bacterial infection.   Dr. Legrand

## 2024-04-27 NOTE — Telephone Encounter (Signed)
 The pt has been advised of the path letter and all questions answered to the best of my ability

## 2024-04-27 NOTE — Telephone Encounter (Signed)
 Patient requesting f/u call in regards to results. Please advise.   Thank you

## 2024-05-30 DIAGNOSIS — Z23 Encounter for immunization: Secondary | ICD-10-CM | POA: Diagnosis not present

## 2024-05-31 DIAGNOSIS — H2513 Age-related nuclear cataract, bilateral: Secondary | ICD-10-CM | POA: Diagnosis not present

## 2024-05-31 DIAGNOSIS — H04123 Dry eye syndrome of bilateral lacrimal glands: Secondary | ICD-10-CM | POA: Diagnosis not present

## 2024-05-31 DIAGNOSIS — Z23 Encounter for immunization: Secondary | ICD-10-CM | POA: Diagnosis not present

## 2024-06-08 DIAGNOSIS — E663 Overweight: Secondary | ICD-10-CM | POA: Diagnosis not present

## 2024-06-08 DIAGNOSIS — M1991 Primary osteoarthritis, unspecified site: Secondary | ICD-10-CM | POA: Diagnosis not present

## 2024-06-08 DIAGNOSIS — Z6826 Body mass index (BMI) 26.0-26.9, adult: Secondary | ICD-10-CM | POA: Diagnosis not present

## 2024-06-08 DIAGNOSIS — Z79899 Other long term (current) drug therapy: Secondary | ICD-10-CM | POA: Diagnosis not present

## 2024-06-08 DIAGNOSIS — R5383 Other fatigue: Secondary | ICD-10-CM | POA: Diagnosis not present

## 2024-06-08 DIAGNOSIS — M0609 Rheumatoid arthritis without rheumatoid factor, multiple sites: Secondary | ICD-10-CM | POA: Diagnosis not present

## 2024-06-08 DIAGNOSIS — R21 Rash and other nonspecific skin eruption: Secondary | ICD-10-CM | POA: Diagnosis not present

## 2024-06-08 DIAGNOSIS — M3501 Sicca syndrome with keratoconjunctivitis: Secondary | ICD-10-CM | POA: Diagnosis not present

## 2024-06-13 ENCOUNTER — Telehealth: Payer: Self-pay | Admitting: Gastroenterology

## 2024-06-13 MED ORDER — AMOXICILLIN-POT CLAVULANATE 875-125 MG PO TABS: 1.0000 | ORAL_TABLET | Freq: Two times a day (BID) | ORAL | 0 refills | Status: AC

## 2024-06-13 MED ORDER — HYDROXYZINE HCL 25 MG PO TABS: 25.0000 mg | ORAL_TABLET | Freq: Three times a day (TID) | ORAL | 0 refills | Status: DC | PRN

## 2024-06-13 NOTE — Telephone Encounter (Signed)
 Called and spoke with pt. Discussed starting a clear liquid diet and allowing bowels to rest. Discussed sending in Rx for Augmentin , and criteria for pt to start medication, including persistent or worsening pain. Pt verbalized understanding. Encouraged pt to maintain a clear liquid diet for 2-3 days to allow for bowels to rest. Pt verbalized understanding and agrees with plan of care.

## 2024-06-13 NOTE — Telephone Encounter (Signed)
 Called and spoke with pt. She reports that she awoke around midnight with an aching pain in the middle of her abdomen. Denies nausea or vomiting. Reports it feels similar to previous diverticulitis flairs that she has had. Denies N/V. Pt states she was given Augmentin  for the last flair-up and it did cause a rash on her legs, but she was prescribed Hydroxyzine  and that helped.

## 2024-06-13 NOTE — Telephone Encounter (Signed)
 Received a call from patient requesting nurse fu call to discuss diverticulitis ? Please review and advise   Thank you

## 2024-06-14 ENCOUNTER — Other Ambulatory Visit (HOSPITAL_COMMUNITY): Payer: Self-pay

## 2024-06-14 ENCOUNTER — Telehealth: Payer: Self-pay

## 2024-06-14 NOTE — Telephone Encounter (Signed)
 Pharmacy Patient Advocate Encounter   Received notification from CoverMyMeds that prior authorization for hydrOXYzine  HCl 25MG  tablets is required/requested.   Insurance verification completed.   The patient is insured through [employer] .   Per test claim: xxx

## 2024-06-14 NOTE — Telephone Encounter (Signed)
 Noted the pt has been advised

## 2024-06-14 NOTE — Telephone Encounter (Signed)
 Pharmacy Patient Advocate Encounter  Received notification from [employer] that Prior Authorization for hydrOXYzine  HCl 25MG  tablets has been APPROVED from 05-15-2024 to 06-14-2025. Ran test claim, Copay is $0.00. This test claim was processed through [facility]- copay amounts may vary at other pharmacies due to pharmacy/plan contracts, or as the patient moves through the different stages of their insurance plan.   PA #/Case ID/Reference #: [id number]

## 2024-06-22 ENCOUNTER — Other Ambulatory Visit (HOSPITAL_BASED_OUTPATIENT_CLINIC_OR_DEPARTMENT_OTHER): Payer: Self-pay | Admitting: Registered Nurse

## 2024-06-22 ENCOUNTER — Encounter (HOSPITAL_BASED_OUTPATIENT_CLINIC_OR_DEPARTMENT_OTHER): Payer: Self-pay | Admitting: Registered Nurse

## 2024-06-22 DIAGNOSIS — E559 Vitamin D deficiency, unspecified: Secondary | ICD-10-CM | POA: Diagnosis not present

## 2024-06-22 DIAGNOSIS — R634 Abnormal weight loss: Secondary | ICD-10-CM | POA: Diagnosis not present

## 2024-06-22 DIAGNOSIS — R053 Chronic cough: Secondary | ICD-10-CM | POA: Diagnosis not present

## 2024-06-22 DIAGNOSIS — I10 Essential (primary) hypertension: Secondary | ICD-10-CM | POA: Diagnosis not present

## 2024-06-22 DIAGNOSIS — R630 Anorexia: Secondary | ICD-10-CM | POA: Diagnosis not present

## 2024-06-22 DIAGNOSIS — K52831 Collagenous colitis: Secondary | ICD-10-CM | POA: Diagnosis not present

## 2024-06-22 DIAGNOSIS — Z8719 Personal history of other diseases of the digestive system: Secondary | ICD-10-CM | POA: Diagnosis not present

## 2024-06-22 DIAGNOSIS — H04123 Dry eye syndrome of bilateral lacrimal glands: Secondary | ICD-10-CM | POA: Diagnosis not present

## 2024-06-22 DIAGNOSIS — R110 Nausea: Secondary | ICD-10-CM | POA: Diagnosis not present

## 2024-06-22 DIAGNOSIS — R232 Flushing: Secondary | ICD-10-CM | POA: Diagnosis not present

## 2024-06-22 DIAGNOSIS — R109 Unspecified abdominal pain: Secondary | ICD-10-CM

## 2024-06-22 DIAGNOSIS — R61 Generalized hyperhidrosis: Secondary | ICD-10-CM | POA: Diagnosis not present

## 2024-06-22 DIAGNOSIS — D72825 Bandemia: Secondary | ICD-10-CM | POA: Diagnosis not present

## 2024-06-22 DIAGNOSIS — Z87891 Personal history of nicotine dependence: Secondary | ICD-10-CM

## 2024-06-22 DIAGNOSIS — Z1289 Encounter for screening for malignant neoplasm of other sites: Secondary | ICD-10-CM | POA: Diagnosis not present

## 2024-06-22 DIAGNOSIS — F17200 Nicotine dependence, unspecified, uncomplicated: Secondary | ICD-10-CM | POA: Diagnosis not present

## 2024-06-22 DIAGNOSIS — R5383 Other fatigue: Secondary | ICD-10-CM | POA: Diagnosis not present

## 2024-06-22 LAB — COMPREHENSIVE METABOLIC PANEL WITH GFR: EGFR: 60.7

## 2024-06-23 ENCOUNTER — Ambulatory Visit (HOSPITAL_BASED_OUTPATIENT_CLINIC_OR_DEPARTMENT_OTHER): Admission: RE | Admit: 2024-06-23 | Discharge: 2024-06-23 | Attending: Registered Nurse

## 2024-06-23 ENCOUNTER — Ambulatory Visit (HOSPITAL_BASED_OUTPATIENT_CLINIC_OR_DEPARTMENT_OTHER)
Admission: RE | Admit: 2024-06-23 | Discharge: 2024-06-23 | Disposition: A | Discharge status: Home / Self Care | Source: Ambulatory Visit | Attending: Registered Nurse | Admitting: Registered Nurse

## 2024-06-23 DIAGNOSIS — Z87891 Personal history of nicotine dependence: Secondary | ICD-10-CM | POA: Insufficient documentation

## 2024-06-23 DIAGNOSIS — R1084 Generalized abdominal pain: Secondary | ICD-10-CM | POA: Diagnosis not present

## 2024-06-23 DIAGNOSIS — K5730 Diverticulosis of large intestine without perforation or abscess without bleeding: Secondary | ICD-10-CM | POA: Diagnosis not present

## 2024-06-23 DIAGNOSIS — K8020 Calculus of gallbladder without cholecystitis without obstruction: Secondary | ICD-10-CM | POA: Diagnosis not present

## 2024-06-23 DIAGNOSIS — F17210 Nicotine dependence, cigarettes, uncomplicated: Secondary | ICD-10-CM | POA: Diagnosis not present

## 2024-06-23 DIAGNOSIS — R109 Unspecified abdominal pain: Secondary | ICD-10-CM | POA: Insufficient documentation

## 2024-06-23 MED ORDER — IOHEXOL 300 MG/ML  SOLN
100.0000 mL | Freq: Once | INTRAMUSCULAR | Status: AC | PRN
  Administered 2024-06-23: 100 mL via INTRAVENOUS

## 2024-06-27 DIAGNOSIS — D649 Anemia, unspecified: Secondary | ICD-10-CM | POA: Diagnosis not present

## 2024-06-27 DIAGNOSIS — I10 Essential (primary) hypertension: Secondary | ICD-10-CM | POA: Diagnosis not present

## 2024-06-29 DIAGNOSIS — N179 Acute kidney failure, unspecified: Secondary | ICD-10-CM | POA: Diagnosis not present

## 2024-06-29 DIAGNOSIS — Z1389 Encounter for screening for other disorder: Secondary | ICD-10-CM | POA: Diagnosis not present

## 2024-06-29 LAB — LAB REPORT - SCANNED: Creatinine, POC: 60.1 mg/dL

## 2024-07-12 DIAGNOSIS — B353 Tinea pedis: Secondary | ICD-10-CM | POA: Diagnosis not present

## 2024-07-12 DIAGNOSIS — Z85828 Personal history of other malignant neoplasm of skin: Secondary | ICD-10-CM | POA: Diagnosis not present

## 2024-07-19 ENCOUNTER — Inpatient Hospital Stay: Attending: Hematology and Oncology | Admitting: Hematology and Oncology

## 2024-07-19 ENCOUNTER — Inpatient Hospital Stay

## 2024-07-19 VITALS — BP 150/84 | HR 78 | Temp 98.3°F | Resp 20 | Ht 65.0 in | Wt 154.1 lb

## 2024-07-19 DIAGNOSIS — Z79899 Other long term (current) drug therapy: Secondary | ICD-10-CM | POA: Diagnosis not present

## 2024-07-19 DIAGNOSIS — G4730 Sleep apnea, unspecified: Secondary | ICD-10-CM | POA: Insufficient documentation

## 2024-07-19 DIAGNOSIS — I739 Peripheral vascular disease, unspecified: Secondary | ICD-10-CM | POA: Insufficient documentation

## 2024-07-19 DIAGNOSIS — R61 Generalized hyperhidrosis: Secondary | ICD-10-CM | POA: Diagnosis not present

## 2024-07-19 DIAGNOSIS — R232 Flushing: Secondary | ICD-10-CM | POA: Insufficient documentation

## 2024-07-19 DIAGNOSIS — Z800 Family history of malignant neoplasm of digestive organs: Secondary | ICD-10-CM | POA: Diagnosis not present

## 2024-07-19 DIAGNOSIS — M3500 Sicca syndrome, unspecified: Secondary | ICD-10-CM | POA: Insufficient documentation

## 2024-07-19 DIAGNOSIS — Z9089 Acquired absence of other organs: Secondary | ICD-10-CM | POA: Insufficient documentation

## 2024-07-19 DIAGNOSIS — D72829 Elevated white blood cell count, unspecified: Secondary | ICD-10-CM | POA: Diagnosis not present

## 2024-07-19 DIAGNOSIS — Z881 Allergy status to other antibiotic agents status: Secondary | ICD-10-CM | POA: Diagnosis not present

## 2024-07-19 DIAGNOSIS — D72828 Other elevated white blood cell count: Secondary | ICD-10-CM | POA: Diagnosis not present

## 2024-07-19 DIAGNOSIS — M810 Age-related osteoporosis without current pathological fracture: Secondary | ICD-10-CM | POA: Insufficient documentation

## 2024-07-19 DIAGNOSIS — K52831 Collagenous colitis: Secondary | ICD-10-CM | POA: Diagnosis not present

## 2024-07-19 DIAGNOSIS — J310 Chronic rhinitis: Secondary | ICD-10-CM | POA: Insufficient documentation

## 2024-07-19 DIAGNOSIS — Z87891 Personal history of nicotine dependence: Secondary | ICD-10-CM | POA: Insufficient documentation

## 2024-07-19 DIAGNOSIS — Z801 Family history of malignant neoplasm of trachea, bronchus and lung: Secondary | ICD-10-CM | POA: Diagnosis not present

## 2024-07-19 DIAGNOSIS — N951 Menopausal and female climacteric states: Secondary | ICD-10-CM | POA: Insufficient documentation

## 2024-07-19 DIAGNOSIS — E785 Hyperlipidemia, unspecified: Secondary | ICD-10-CM | POA: Diagnosis not present

## 2024-07-19 DIAGNOSIS — M069 Rheumatoid arthritis, unspecified: Secondary | ICD-10-CM | POA: Diagnosis not present

## 2024-07-19 DIAGNOSIS — R634 Abnormal weight loss: Secondary | ICD-10-CM | POA: Insufficient documentation

## 2024-07-19 DIAGNOSIS — Z860100 Personal history of colon polyps, unspecified: Secondary | ICD-10-CM | POA: Diagnosis not present

## 2024-07-19 LAB — CBC WITH DIFFERENTIAL (CANCER CENTER ONLY)
Abs Immature Granulocytes: 0.04 K/uL (ref 0.00–0.07)
Basophils Absolute: 0.1 K/uL (ref 0.0–0.1)
Basophils Relative: 1 %
Eosinophils Absolute: 0.2 K/uL (ref 0.0–0.5)
Eosinophils Relative: 2 %
HCT: 41.6 % (ref 36.0–46.0)
Hemoglobin: 14 g/dL (ref 12.0–15.0)
Immature Granulocytes: 0 %
Lymphocytes Relative: 19 %
Lymphs Abs: 2.3 K/uL (ref 0.7–4.0)
MCH: 32.4 pg (ref 26.0–34.0)
MCHC: 33.7 g/dL (ref 30.0–36.0)
MCV: 96.3 fL (ref 80.0–100.0)
Monocytes Absolute: 1.1 K/uL — ABNORMAL HIGH (ref 0.1–1.0)
Monocytes Relative: 9 %
Neutro Abs: 8.6 K/uL — ABNORMAL HIGH (ref 1.7–7.7)
Neutrophils Relative %: 69 %
Platelet Count: 201 K/uL (ref 150–400)
RBC: 4.32 MIL/uL (ref 3.87–5.11)
RDW: 13 % (ref 11.5–15.5)
WBC Count: 12.3 K/uL — ABNORMAL HIGH (ref 4.0–10.5)
nRBC: 0 % (ref 0.0–0.2)

## 2024-07-19 LAB — CORTISOL: Cortisol, Plasma: 8.7 ug/dL

## 2024-07-19 NOTE — Assessment & Plan Note (Signed)
 Lab review: 06/22/2024: WBC 15.6, ANC 11.3, ALC 2.4, hemoglobin 14.1, platelets 232  Past medical history: Collagenous colitis, tobacco use, chronic rhinitis, chronic osteoarthritis, rheumatoid arthritis Sjogren syndrome, steroid use  CT chest abdomen and pelvis and upper endoscopy and colonoscopy: Apparently were all negative.  I do not have a copy of these reports.  Intense headaches and profound hot flashes and night sweats for the past month along with 15 pound weight loss  Recommendation: CBC with differential ACTH and cortisol levels Brain MRI for the headaches  The leukocytosis/neutrophilia is secondary to underlying autoimmune conditions and budesonide .  I am not concerned about a hematological malignancies.  She has had extensive CT scans and endoscopies which ruled out lymphoma.  Hot flashes and night sweats: I recommended that she take over-the-counter Estroven.   Telephone visit in 2 weeks to discuss results of the testing

## 2024-07-19 NOTE — Progress Notes (Signed)
 [facility] CONSULT NOTE  Patient Care Team: Yolande Toribio MATSU, MD as PCP - General (Internal Medicine) Nahser, Aleene PARAS, MD (Inactive) as PCP - Cardiology (Cardiology)  CHIEF COMPLAINTS/PURPOSE OF CONSULTATION:  Leukocytosis, hot flashes and night sweats and weight loss  HISTORY OF PRESENTING ILLNESS:  History of Present Illness Loretta Austin is a 78 year old female with rheumatoid arthritis and collagenous colitis who presents with elevated white blood cell count and severe hot flashes.  She experiences severe hot flashes and night sweats for the past two months, coinciding with the discovery of an elevated white blood cell count. The hot flashes are intense, with drenching sweats followed by chills. These symptoms persist alongside her leukocytosis.  There is a significant weight loss of 16 pounds over the last four months. She is cautious about eating before taking her morning medications to avoid vomiting. Her rheumatoid arthritis is managed with medication, and she takes budesonide  for collagenous colitis. An attempt to discontinue budesonide  eight months ago was unsuccessful due to diarrhea.  Two months ago, she noticed a transient fatty raised area on her body and experienced severe itching on one arm. She received a flu shot and a COVID shot about a week before her symptoms began. Extensive diagnostic work-up, including CT scans, upper and lower endoscopies, and colonoscopies, revealed no significant findings.    I reviewed her records extensively and collaborated the history with the patient.   MEDICAL HISTORY:  Past Medical History:  Diagnosis Date   Arthritis    Cholecystolithiasis    Colon polyps    Diverticulosis    Food poisoning    GERD (gastroesophageal reflux disease)    Heart murmur    High blood pressure    Hyperlipidemia    Microscopic colitis    Osteoporosis    PAD (peripheral artery disease) 01/16/2023   Lower extremity arterial  Dopplers 02/06/2023: Right-no stenosis, mild-moderate atherosclerosis throughout especially in CFA and calf vessels  ABIs 02/06/2023: Right 0.91, left 1.12   Sjogren's disease    Sleep apnea    wears appliance    SURGICAL HISTORY: Past Surgical History:  Procedure Laterality Date   COLONOSCOPY  04/19/2021   02/12/16-at [place]   TONSILLECTOMY AND ADENOIDECTOMY     TUBAL LIGATION      SOCIAL HISTORY: Social History   Socioeconomic History   Marital status: Widowed    Spouse name: Not on file   Number of children: 1   Years of education: Not on file   Highest education level: Not on file  Occupational History   Occupation: retired  Tobacco Use   Smoking status: Former    Current packs/day: 0.00    Types: Cigarettes    Quit date: 09/23/1983    Years since quitting: 40.8   Smokeless tobacco: Never  Vaping Use   Vaping status: Never Used  Substance and Sexual Activity   Alcohol  use: Yes    Alcohol /week: 1.0 standard drink of alcohol     Types: 1 Standard drinks or equivalent per week    Comment: occ   Drug use: Not Currently   Sexual activity: Not Currently    Partners: Male  Other Topics Concern   Not on file  Social History Narrative   Not on file   Social Drivers of Health   Financial Resource Strain: Not on file  Food Insecurity: No Food Insecurity (07/19/2024)   Hunger Vital Sign    Worried About Running Out of Food in the Last  Year: Never true    Ran Out of Food in the Last Year: Never true  Transportation Needs: No Transportation Needs (07/19/2024)   PRAPARE - Administrator, Civil Service (Medical): No    Lack of Transportation (Non-Medical): No  Physical Activity: Not on file  Stress: Not on file  Social Connections: Not on file  Intimate Partner Violence: Not on file    FAMILY HISTORY: Family History  Problem Relation Age of Onset   Lung cancer Mother    Colon cancer Mother 85   Esophageal cancer Neg Hx    Rectal cancer Neg Hx     Stomach cancer Neg Hx    Colon polyps Neg Hx     ALLERGIES:  is allergic to ciprofloxacin and metronidazole.  MEDICATIONS:  Current Outpatient Medications  Medication Sig Dispense Refill   adalimumab (HUMIRA, 2 PEN,) 40 MG/0.4ML pen 0.4 mL Subcutaneous every 2 weeks     Bacillus Coagulans-Inulin (ALIGN PREBIOTIC-PROBIOTIC) 5-1.25 MG-GM CHEW See admin instructions.     budesonide  (ENTOCORT EC ) 3 MG 24 hr capsule Take 3 capsules (9 mg total) by mouth daily. 90 capsule 3   calcium  citrate-vitamin D 500-500 MG-UNIT chewable tablet Chew 1 tablet by mouth once a week.     Cholecalciferol (VITAMIN D3) 1.25 MG (50000 UT) CAPS Take 1 capsule by mouth once a week.     Cyanocobalamin  (VITAMIN B-12 PO) Take 2.5 mg by mouth daily.     IBUPROFEN PO Take by mouth daily.     irbesartan (AVAPRO) 150 MG tablet Take 150 mg by mouth daily.     Magnesium 400 MG TABS Take 1 tablet by mouth daily in the afternoon.     Multiple Vitamins-Iron (MULTIVITAMIN/IRON PO) Take 1 tablet by mouth daily.     RESTASIS  0.05 % ophthalmic emulsion Place 1 drop into both eyes 2 (two) times daily.     rosuvastatin  (CRESTOR ) 10 MG tablet Take 1 tablet (10 mg total) by mouth daily. 90 tablet 3   TYRVAYA 0.03 MG/ACT SOLN 0.03 mg.     No current facility-administered medications for this visit.    REVIEW OF SYSTEMS:   Constitutional: Denies fevers, chills or abnormal night sweats All other systems were reviewed with the patient and are negative.  PHYSICAL EXAMINATION: ECOG PERFORMANCE STATUS: 1 - Symptomatic but completely ambulatory  Vitals:   07/19/24 1444  BP: (!) 150/84  Pulse: 78  Resp: 20  Temp: 98.3 F (36.8 C)  SpO2: 100%   Filed Weights   07/19/24 1444  Weight: 154 lb 1.6 oz (69.9 kg)    GENERAL:alert, no distress and comfortable  LABORATORY DATA:  I have reviewed the data as listed Lab Results  Component Value Date   WBC 12.3 (H) 07/19/2024   HGB 14.0 07/19/2024   HCT 41.6 07/19/2024   MCV 96.3  07/19/2024   PLT 201 07/19/2024   Lab Results  Component Value Date   NA 135 03/12/2020   K 3.5 03/12/2020   CL 104 03/12/2020   CO2 22 03/12/2020    RADIOGRAPHIC STUDIES: I have personally reviewed the radiological reports and agreed with the findings in the report.  ASSESSMENT AND PLAN:  Neutrophilia Lab review: 06/22/2024: WBC 15.6, ANC 11.3, ALC 2.4, hemoglobin 14.1, platelets 232  Past medical history: Collagenous colitis, tobacco use, chronic rhinitis, chronic osteoarthritis, rheumatoid arthritis Sjogren syndrome, steroid use  CT chest abdomen and pelvis and upper endoscopy and colonoscopy: Apparently were all negative.  I do not have  a copy of these reports.  Intense headaches and profound hot flashes and night sweats for the past month along with 15 pound weight loss  Recommendation: CBC with differential ACTH and cortisol levels Brain MRI for the headaches  The leukocytosis/neutrophilia is secondary to underlying autoimmune conditions and budesonide .  I am not concerned about a hematological malignancies.  She has had extensive CT scans and endoscopies which ruled out lymphoma.  Hot flashes and night sweats: I recommended that she take over-the-counter Estroven.   Telephone visit in 2 weeks to discuss results of the testing  Assessment and Plan Assessment & Plan Leukocytosis with neutrophilia secondary to autoimmune disease and budesonide  use Leukocytosis with neutrophilia likely due to autoimmune conditions and budesonide . No hematological malignancy based on normal labs and imaging. Differential includes infections, inflammations, and medications. - Order CBC with differential. - Review blood smear for abnormal cells. - Reassured regarding low likelihood of hematological malignancy.  Rheumatoid arthritis Long-standing rheumatoid arthritis contributing to leukocytosis due to inflammation.  Collagenous colitis Collagenous colitis managed with budesonide ,  contributing to leukocytosis.  Hot flashes and night sweats Severe hot flashes and night sweats likely due to hormonal imbalance. Discussed non-hormonal treatments. Estroven may reduce symptoms. - Recommend over-the-counter Estroven for hot flashes and night sweats.  Unintentional weight loss Unintentional weight loss of 16 pounds over four months. Extensive evaluations negative. Potentially related to autoimmune conditions or systemic issues.  Headache with abnormal sensations Recent abnormal head sensations. Differential includes psychosomatic causes or hormonal imbalances. MRI recommended to rule out organic causes. - Order MRI of the brain within a week.    All questions were answered. The patient knows to call the clinic with any problems, questions or concerns.  I personally spent a total of 60 minutes in the care of the patient today including preparing to see the patient, getting/reviewing separately obtained history, performing a medically appropriate exam/evaluation, counseling and educating, placing orders, referring and communicating with other health care professionals, documenting clinical information in the EHR, independently interpreting results, communicating results, and coordinating care.    Viinay K Darriona Dehaas, MD 07/20/24   This encounter was created in error - please disregard.

## 2024-07-20 LAB — ACTH: C206 ACTH: 39.7 pg/mL (ref 7.2–63.3)

## 2024-07-20 NOTE — Addendum Note (Signed)
 Addended by: ODEAN POTTS on: 07/20/2024 06:48 AM   Modules accepted: Level of Service

## 2024-07-21 ENCOUNTER — Ambulatory Visit (HOSPITAL_COMMUNITY)
Admission: RE | Admit: 2024-07-21 | Discharge: 2024-07-21 | Disposition: A | Discharge status: Home / Self Care | Source: Ambulatory Visit | Attending: Hematology and Oncology | Admitting: Hematology and Oncology

## 2024-07-21 ENCOUNTER — Telehealth: Payer: Self-pay | Admitting: Hematology and Oncology

## 2024-07-21 DIAGNOSIS — D72828 Other elevated white blood cell count: Secondary | ICD-10-CM | POA: Diagnosis not present

## 2024-07-21 DIAGNOSIS — R519 Headache, unspecified: Secondary | ICD-10-CM | POA: Diagnosis not present

## 2024-07-21 MED ORDER — VENLAFAXINE HCL ER 37.5 MG PO CP24: 37.5000 mg | ORAL_CAPSULE | Freq: Every day | ORAL | 3 refills | Status: DC

## 2024-07-21 MED ORDER — GADOBUTROL 1 MMOL/ML IV SOLN
6.0000 mL | Freq: Once | INTRAVENOUS | Status: AC | PRN
  Administered 2024-07-21: 6 mL via INTRAVENOUS

## 2024-07-21 NOTE — Telephone Encounter (Signed)
 I discussed the results of brain MRI which were normal. I also discussed the lab work and the WBC count which are fairly close to normal.  Severe hot flashes: I sent a prescription for Effexor and if it works well for her she will continue it and I sent a years worth of prescriptions and for subsequent years she will contact her primary care physician.  Since there is no other hematological issues, I recommended that we can see her on an as-needed basis.

## 2024-07-26 DIAGNOSIS — Z79899 Other long term (current) drug therapy: Secondary | ICD-10-CM | POA: Diagnosis not present

## 2024-07-26 DIAGNOSIS — R5383 Other fatigue: Secondary | ICD-10-CM | POA: Diagnosis not present

## 2024-07-26 DIAGNOSIS — R61 Generalized hyperhidrosis: Secondary | ICD-10-CM | POA: Diagnosis not present

## 2024-07-26 DIAGNOSIS — M3501 Sicca syndrome with keratoconjunctivitis: Secondary | ICD-10-CM | POA: Diagnosis not present

## 2024-07-26 DIAGNOSIS — Z6825 Body mass index (BMI) 25.0-25.9, adult: Secondary | ICD-10-CM | POA: Diagnosis not present

## 2024-07-26 DIAGNOSIS — K52831 Collagenous colitis: Secondary | ICD-10-CM | POA: Diagnosis not present

## 2024-07-26 DIAGNOSIS — M1991 Primary osteoarthritis, unspecified site: Secondary | ICD-10-CM | POA: Diagnosis not present

## 2024-07-26 DIAGNOSIS — M0609 Rheumatoid arthritis without rheumatoid factor, multiple sites: Secondary | ICD-10-CM | POA: Diagnosis not present

## 2024-07-26 DIAGNOSIS — E663 Overweight: Secondary | ICD-10-CM | POA: Diagnosis not present

## 2024-08-09 LAB — HM HEPATITIS C SCREENING LAB: HM Hepatitis Screen: NEGATIVE

## 2024-08-12 ENCOUNTER — Emergency Department (HOSPITAL_BASED_OUTPATIENT_CLINIC_OR_DEPARTMENT_OTHER)

## 2024-08-12 ENCOUNTER — Other Ambulatory Visit: Payer: Self-pay

## 2024-08-12 ENCOUNTER — Inpatient Hospital Stay (HOSPITAL_BASED_OUTPATIENT_CLINIC_OR_DEPARTMENT_OTHER)
Admission: EM | Admit: 2024-08-12 | Discharge: 2024-08-16 | DRG: 280 | Disposition: A | Discharge status: Home / Self Care | Attending: Family Medicine | Admitting: Family Medicine

## 2024-08-12 DIAGNOSIS — E785 Hyperlipidemia, unspecified: Secondary | ICD-10-CM | POA: Diagnosis present

## 2024-08-12 DIAGNOSIS — Z8249 Family history of ischemic heart disease and other diseases of the circulatory system: Secondary | ICD-10-CM | POA: Diagnosis not present

## 2024-08-12 DIAGNOSIS — D72829 Elevated white blood cell count, unspecified: Secondary | ICD-10-CM | POA: Diagnosis present

## 2024-08-12 DIAGNOSIS — I2510 Atherosclerotic heart disease of native coronary artery without angina pectoris: Secondary | ICD-10-CM | POA: Diagnosis present

## 2024-08-12 DIAGNOSIS — R112 Nausea with vomiting, unspecified: Secondary | ICD-10-CM

## 2024-08-12 DIAGNOSIS — Z79899 Other long term (current) drug therapy: Secondary | ICD-10-CM

## 2024-08-12 DIAGNOSIS — Z7901 Long term (current) use of anticoagulants: Secondary | ICD-10-CM | POA: Diagnosis not present

## 2024-08-12 DIAGNOSIS — Z87891 Personal history of nicotine dependence: Secondary | ICD-10-CM | POA: Diagnosis not present

## 2024-08-12 DIAGNOSIS — Z881 Allergy status to other antibiotic agents status: Secondary | ICD-10-CM

## 2024-08-12 DIAGNOSIS — I700 Atherosclerosis of aorta: Secondary | ICD-10-CM | POA: Diagnosis present

## 2024-08-12 DIAGNOSIS — Z7982 Long term (current) use of aspirin: Secondary | ICD-10-CM

## 2024-08-12 DIAGNOSIS — N951 Menopausal and female climacteric states: Secondary | ICD-10-CM | POA: Diagnosis present

## 2024-08-12 DIAGNOSIS — Z800 Family history of malignant neoplasm of digestive organs: Secondary | ICD-10-CM

## 2024-08-12 DIAGNOSIS — I110 Hypertensive heart disease with heart failure: Secondary | ICD-10-CM | POA: Diagnosis present

## 2024-08-12 DIAGNOSIS — Z883 Allergy status to other anti-infective agents status: Secondary | ICD-10-CM

## 2024-08-12 DIAGNOSIS — Z860100 Personal history of colon polyps, unspecified: Secondary | ICD-10-CM

## 2024-08-12 DIAGNOSIS — R778 Other specified abnormalities of plasma proteins: Secondary | ICD-10-CM | POA: Diagnosis not present

## 2024-08-12 DIAGNOSIS — R7989 Other specified abnormal findings of blood chemistry: Secondary | ICD-10-CM | POA: Diagnosis not present

## 2024-08-12 DIAGNOSIS — I4891 Unspecified atrial fibrillation: Secondary | ICD-10-CM | POA: Diagnosis present

## 2024-08-12 DIAGNOSIS — I214 Non-ST elevation (NSTEMI) myocardial infarction: Secondary | ICD-10-CM | POA: Diagnosis present

## 2024-08-12 DIAGNOSIS — Z888 Allergy status to other drugs, medicaments and biological substances status: Secondary | ICD-10-CM

## 2024-08-12 DIAGNOSIS — M810 Age-related osteoporosis without current pathological fracture: Secondary | ICD-10-CM | POA: Diagnosis present

## 2024-08-12 DIAGNOSIS — I428 Other cardiomyopathies: Secondary | ICD-10-CM | POA: Diagnosis present

## 2024-08-12 DIAGNOSIS — Z1152 Encounter for screening for COVID-19: Secondary | ICD-10-CM

## 2024-08-12 DIAGNOSIS — K5730 Diverticulosis of large intestine without perforation or abscess without bleeding: Secondary | ICD-10-CM | POA: Diagnosis not present

## 2024-08-12 DIAGNOSIS — M3500 Sicca syndrome, unspecified: Secondary | ICD-10-CM | POA: Diagnosis present

## 2024-08-12 DIAGNOSIS — K8020 Calculus of gallbladder without cholecystitis without obstruction: Secondary | ICD-10-CM | POA: Diagnosis not present

## 2024-08-12 DIAGNOSIS — I739 Peripheral vascular disease, unspecified: Secondary | ICD-10-CM | POA: Diagnosis present

## 2024-08-12 DIAGNOSIS — K219 Gastro-esophageal reflux disease without esophagitis: Secondary | ICD-10-CM | POA: Diagnosis present

## 2024-08-12 DIAGNOSIS — Z882 Allergy status to sulfonamides status: Secondary | ICD-10-CM

## 2024-08-12 DIAGNOSIS — R079 Chest pain, unspecified: Secondary | ICD-10-CM | POA: Diagnosis not present

## 2024-08-12 DIAGNOSIS — R001 Bradycardia, unspecified: Secondary | ICD-10-CM | POA: Diagnosis present

## 2024-08-12 DIAGNOSIS — M069 Rheumatoid arthritis, unspecified: Secondary | ICD-10-CM | POA: Diagnosis present

## 2024-08-12 DIAGNOSIS — I5041 Acute combined systolic (congestive) and diastolic (congestive) heart failure: Secondary | ICD-10-CM | POA: Diagnosis not present

## 2024-08-12 DIAGNOSIS — I4892 Unspecified atrial flutter: Secondary | ICD-10-CM | POA: Diagnosis not present

## 2024-08-12 DIAGNOSIS — K52831 Collagenous colitis: Secondary | ICD-10-CM | POA: Diagnosis present

## 2024-08-12 DIAGNOSIS — G4733 Obstructive sleep apnea (adult) (pediatric): Secondary | ICD-10-CM | POA: Diagnosis present

## 2024-08-12 DIAGNOSIS — M25551 Pain in right hip: Secondary | ICD-10-CM

## 2024-08-12 DIAGNOSIS — R0789 Other chest pain: Secondary | ICD-10-CM | POA: Diagnosis not present

## 2024-08-12 DIAGNOSIS — Z801 Family history of malignant neoplasm of trachea, bronchus and lung: Secondary | ICD-10-CM

## 2024-08-12 DIAGNOSIS — I3481 Nonrheumatic mitral (valve) annulus calcification: Secondary | ICD-10-CM | POA: Diagnosis not present

## 2024-08-12 LAB — BASIC METABOLIC PANEL WITH GFR
Anion gap: 20 — ABNORMAL HIGH (ref 5–15)
BUN: 24 mg/dL — ABNORMAL HIGH (ref 8–23)
CO2: 23 mmol/L (ref 22–32)
Calcium: 10.4 mg/dL — ABNORMAL HIGH (ref 8.9–10.3)
Chloride: 95 mmol/L — ABNORMAL LOW (ref 98–111)
Creatinine, Ser: 0.83 mg/dL (ref 0.44–1.00)
GFR, Estimated: >60 mL/min (ref >60)
Glucose, Bld: 172 mg/dL — ABNORMAL HIGH (ref 70–99)
Potassium: 3.6 mmol/L (ref 3.5–5.1)
Sodium: 138 mmol/L (ref 135–145)

## 2024-08-12 LAB — HEPATIC FUNCTION PANEL
ALT: 10 U/L (ref 0–44)
AST: 33 U/L (ref 15–41)
Albumin: 4.7 g/dL (ref 3.5–5.0)
Alkaline Phosphatase: 58 U/L (ref 38–126)
Bilirubin, Direct: 0.3 mg/dL — ABNORMAL HIGH (ref 0.0–0.2)
Indirect Bilirubin: 0.5 mg/dL (ref 0.3–0.9)
Total Bilirubin: 0.8 mg/dL (ref 0.0–1.2)
Total Protein: 7.5 g/dL (ref 6.5–8.1)

## 2024-08-12 LAB — URINALYSIS, W/ REFLEX TO CULTURE (INFECTION SUSPECTED)
Bacteria, UA: NONE SEEN
Bilirubin Urine: NEGATIVE
Glucose, UA: NEGATIVE mg/dL
Ketones, ur: 15 mg/dL — AB
Leukocytes,Ua: NEGATIVE
Nitrite: NEGATIVE
Protein, ur: NEGATIVE mg/dL
Specific Gravity, Urine: 1.026 (ref 1.005–1.030)
pH: 7.5 (ref 5.0–8.0)

## 2024-08-12 LAB — TROPONIN T, HIGH SENSITIVITY
Troponin T High Sensitivity: 365 ng/L (ref 0–19)
Troponin T High Sensitivity: 676 ng/L (ref 0–19)

## 2024-08-12 LAB — CBC
HCT: 42.4 % (ref 36.0–46.0)
Hemoglobin: 14.2 g/dL (ref 12.0–15.0)
MCH: 32.7 pg (ref 26.0–34.0)
MCHC: 33.5 g/dL (ref 30.0–36.0)
MCV: 97.7 fL (ref 80.0–100.0)
Platelets: 304 K/uL (ref 150–400)
RBC: 4.34 MIL/uL (ref 3.87–5.11)
RDW: 12.9 % (ref 11.5–15.5)
WBC: 21.6 K/uL — ABNORMAL HIGH (ref 4.0–10.5)
nRBC: 0 % (ref 0.0–0.2)

## 2024-08-12 LAB — RESP PANEL BY RT-PCR (RSV, FLU A&B, COVID)  RVPGX2
Influenza A by PCR: NEGATIVE
Influenza B by PCR: NEGATIVE
Resp Syncytial Virus by PCR: NEGATIVE
SARS Coronavirus 2 by RT PCR: NEGATIVE

## 2024-08-12 LAB — LIPASE, BLOOD: Lipase: 22 U/L (ref 11–51)

## 2024-08-12 MED ORDER — HEPARIN (PORCINE) 25000 UT/250ML-% IV SOLN
850.0000 [IU]/h | INTRAVENOUS | Status: DC
  Filled 2024-08-12: qty 250

## 2024-08-12 MED ORDER — SODIUM CHLORIDE 0.9 % IV SOLN
1.0000 g | Freq: Once | INTRAVENOUS | Status: AC
  Administered 2024-08-12: 1 g via INTRAVENOUS
  Filled 2024-08-12: qty 10

## 2024-08-12 MED ORDER — HEPARIN BOLUS VIA INFUSION: 4000.0000 [IU] | Freq: Once | INTRAVENOUS | Status: DC

## 2024-08-12 MED ORDER — IOHEXOL 350 MG/ML SOLN
90.0000 mL | Freq: Once | INTRAVENOUS | Status: AC | PRN
  Administered 2024-08-12: 90 mL via INTRAVENOUS

## 2024-08-12 MED ORDER — ASPIRIN 81 MG PO CHEW
324.0000 mg | CHEWABLE_TABLET | Freq: Once | ORAL | Status: AC
  Administered 2024-08-12: 324 mg via ORAL
  Filled 2024-08-12: qty 4

## 2024-08-12 NOTE — ED Notes (Signed)
 PT placed on 12 lead cardiac monitoring

## 2024-08-12 NOTE — Progress Notes (Signed)
 PHARMACY - ANTICOAGULATION CONSULT NOTE  Pharmacy Consult for heparin  Indication: chest pain/ACS  Allergies  Allergen Reactions   Ciprofloxacin Rash   Metronidazole Rash    Patient Measurements: Height: 5' 5 (165.1 cm) Weight: 67.6 kg (149 lb) IBW/kg (Calculated) : 57 HEPARIN  DW (KG): 67.6  Vital Signs: Temp: 98.3 F (36.8 C) (11/21 1608) Temp Source: Oral (11/21 1608) BP: 143/96 (11/21 1605) Pulse Rate: 76 (11/21 1605)  Labs: Recent Labs    08/12/24 1209  HGB 14.2  HCT 42.4  PLT 304  CREATININE 0.83    Estimated Creatinine Clearance: 51.1 mL/min (by C-G formula based on SCr of 0.83 mg/dL).   Medical History: Past Medical History:  Diagnosis Date   Arthritis    Cholecystolithiasis    Colon polyps    Diverticulosis    Food poisoning    GERD (gastroesophageal reflux disease)    Heart murmur    High blood pressure    Hyperlipidemia    Microscopic colitis    Osteoporosis    PAD (peripheral artery disease) 01/16/2023   Lower extremity arterial Dopplers 02/06/2023: Right-no stenosis, mild-moderate atherosclerosis throughout especially in CFA and calf vessels  ABIs 02/06/2023: Right 0.91, left 1.12   Sjogren's disease    Sleep apnea    wears appliance     Assessment: 78 YOF presenting with CP, increasing troponin, she is not on anticoagulation PTA, CBC wnl  Goal of Therapy:  Heparin  level 0.3-0.7 units/ml Monitor platelets by anticoagulation protocol: Yes   Plan:  Heparin  4000 units IV x 1, and gtt at 850 units/hr F/u 6 hour heparin  level F/u cards eval and recs  Dorn Poot, PharmD, [facility] Clinical Pharmacist ED Pharmacist Phone # [phone] 08/12/2024 4:21 PM

## 2024-08-12 NOTE — ED Provider Notes (Signed)
  Physical Exam  BP (!) 151/76 (BP Location: Right Arm)   Pulse 74   Temp 98.3 F (36.8 C) (Oral)   Resp 14   Ht 5' 5 (1.651 m)   Wt 67.6 kg   SpO2 100%   BMI 24.79 kg/m   Physical Exam Vitals and nursing note reviewed.  HENT:     Head: Normocephalic and atraumatic.  Eyes:     Pupils: Pupils are equal, round, and reactive to light.  Cardiovascular:     Rate and Rhythm: Normal rate and regular rhythm.  Pulmonary:     Effort: Pulmonary effort is normal.     Breath sounds: Normal breath sounds.  Abdominal:     Palpations: Abdomen is soft.     Tenderness: There is no abdominal tenderness.  Skin:    General: Skin is warm and dry.  Neurological:     Mental Status: She is alert.  Psychiatric:        Mood and Affect: Mood normal.     Procedures  Procedures  ED Course / MDM    Medical Decision Making I, Ozell Marine DO, have assumed care of this patient from the previous provider Dr. Emil  In brief this is a 78 year old female who has presented to the ED for weeks of hot flashes along with some nausea vomiting diarrhea and reported chest pain last night.  No active chest pain today or during her ED visit.  Initial white count of 21 and troponin were elevated above 300 and Dr. Emil spoke with Dr. Pietro from cardiology about this.  No ischemic EKG changes.  Low suspicion for ACS.  Recommended looking for other etiologies for elevated troponin such as PE.  PE study was negative.  Delta troponin was up now above 600.  I reached out to Dr. Pietro again who does not feel this is ACS related or likely inflammatory or infectious.  Does not recommend starting heparin  bolus or drip.  Discussed with Dr.Ghimire who recommends UA, blood cultures and broad-spectrum antibiotics.  He will admit patient to medicine service.  Most likely differential diagnosis at this time would be myocarditis or pericarditis  Amount and/or Complexity of Data Reviewed Labs: ordered. Radiology:  ordered.  Risk OTC drugs. Prescription drug management. Decision regarding hospitalization.          Marine Ozell LABOR, DO 08/12/24 1713

## 2024-08-12 NOTE — ED Notes (Signed)
 Purple man Green.

## 2024-08-12 NOTE — H&P (Signed)
 History and Physical    Loretta Austin FMW:[id number] DOB: 01-Jun-1946 DOA: 08/12/2024  PCP: Yolande Toribio MATSU, MD Patient coming from: {Point_of_Origin:[address]}  Chief Complaint: ***  HPI: Loretta Austin is a 78 y.o. female with medical history significant of ***  ED Course:  In the ER, BP, HR, RR, O2 saturation,  and Tmax. Cbc demonstrated wbc,  hb/hct, and platelet. Chemistry demonstrated Na, K, Cl, bicarb, Bun/Cr and glucose. Anion gap. Initial troponin was.  CXR demonstrated no acute cardiopulmonary findings. Urinalysis demonstrated. EKG findings for the patient were the following.  Review of Systems:  All systems reviewed and apart from history of presenting illness, are negative.  Past Medical History:  Diagnosis Date   Arthritis    Cholecystolithiasis    Colon polyps    Diverticulosis    Food poisoning    GERD (gastroesophageal reflux disease)    Heart murmur    High blood pressure    Hyperlipidemia    Microscopic colitis    Osteoporosis    PAD (peripheral artery disease) 01/16/2023   Lower extremity arterial Dopplers 02/06/2023: Right-no stenosis, mild-moderate atherosclerosis throughout especially in CFA and calf vessels  ABIs 02/06/2023: Right 0.91, left 1.12   Sjogren's disease    Sleep apnea    wears appliance    Past Surgical History:  Procedure Laterality Date   COLONOSCOPY  04/19/2021   02/12/16-at [place]   TONSILLECTOMY AND ADENOIDECTOMY     TUBAL LIGATION       reports that she quit smoking about 40 years ago. Her smoking use included cigarettes. She has never used smokeless tobacco. She reports current alcohol  use of about 1.0 standard drink of alcohol  per week. She reports that she does not currently use drugs.  Allergies  Allergen Reactions   Ciprofloxacin Rash   Metronidazole Rash    Family History  Problem Relation Age of Onset   Lung cancer Mother    Colon cancer Mother 62   Esophageal cancer Neg Hx    Rectal cancer Neg Hx    Stomach  cancer Neg Hx    Colon polyps Neg Hx     Prior to Admission medications   Medication Sig Start Date End Date Taking? Authorizing Provider  HUMIRA, 2 SYRINGE, 40 MG/0.4ML prefilled syringe Inject into the skin. 07/04/24  Yes [provider]  nitrofurantoin, macrocrystal-monohydrate, (MACROBID) 100 MG capsule Take 100 mg by mouth daily. 05/21/24  Yes [provider]  adalimumab (HUMIRA, 2 PEN,) 40 MG/0.4ML pen 0.4 mL Subcutaneous every 2 weeks 12/22/23   [provider]  Bacillus Coagulans-Inulin (ALIGN PREBIOTIC-PROBIOTIC) 5-1.25 MG-GM CHEW See admin instructions.    [provider]  budesonide  (ENTOCORT EC ) 3 MG 24 hr capsule Take 3 capsules (9 mg total) by mouth daily. 03/02/24   Legrand Victory LITTIE DOUGLAS, MD  calcium  citrate-vitamin D 500-500 MG-UNIT chewable tablet Chew 1 tablet by mouth once a week.    [provider]  Cholecalciferol (VITAMIN D3) 1.25 MG (50000 UT) CAPS Take 1 capsule by mouth once a week.    [provider]  Cyanocobalamin  (VITAMIN B-12 PO) Take 2.5 mg by mouth daily.    [provider]  IBUPROFEN PO Take by mouth daily.    [provider]  irbesartan (AVAPRO) 150 MG tablet Take 150 mg by mouth daily. 01/01/24   [provider]  Magnesium 400 MG TABS Take 1 tablet by mouth daily in the afternoon.    [provider]  Multiple Vitamins-Iron (MULTIVITAMIN/IRON PO) Take  1 tablet by mouth daily.    [provider]  RESTASIS  0.05 % ophthalmic emulsion Place 1 drop into both eyes 2 (two) times daily. 01/16/18   [provider]  rosuvastatin  (CRESTOR ) 10 MG tablet Take 1 tablet (10 mg total) by mouth daily. 05/30/20   Nahser, Aleene PARAS, MD  TYRVAYA  0.03 MG/ACT SOLN 0.03 mg.    [provider]  venlafaxine  XR (EFFEXOR -XR) 37.5 MG 24 hr capsule Take 1 capsule (37.5 mg total) by mouth daily with breakfast. 07/21/24   Odean Potts, MD    Physical Exam: Vitals:   08/12/24 2046  08/12/24 2100 08/12/24 2228 08/12/24 2231  BP:  129/71  137/75  Pulse:  67    Resp:  15  17  Temp: 98.5 F (36.9 C)   98.2 F (36.8 C)  TempSrc: Oral   Oral  SpO2:  96%  98%  Weight:   68.3 kg   Height:   5' 5 (1.651 m)     Physical Exam Constitutional:      General: He is not in acute distress.    Appearance: Normal appearance.  HENT:     Head: Normocephalic and atraumatic.  Eyes:     Extraocular Movements: Extraocular movements intact.     Conjunctiva/sclera: Conjunctivae normal.     Pupils: Pupils are equal, round, and reactive to light.  Cardiovascular:     Rate and Rhythm: Normal rate and regular rhythm.     Pulses: Normal pulses.     Heart sounds: Normal heart sounds.  Pulmonary:     Effort: Pulmonary effort is normal. No respiratory distress.     Breath sounds: Normal breath sounds. No wheezing, rhonchi or rales.  Abdominal:     General: Abdomen is flat. Bowel sounds are normal. There is no distension.     Palpations: Abdomen is soft.     Tenderness: There is no abdominal tenderness.  Musculoskeletal:        General: No deformity. Normal range of motion.  Skin:    General: Skin is warm and dry.     Coloration: Skin is not jaundiced.  Neurological:     General: No focal deficit present.     Mental Status: He is alert and oriented to person, place, and time. Mental status is at baseline.   Labs on Admission: I have personally reviewed following labs and imaging studies  CBC: Recent Labs  Lab 08/12/24 1209  WBC 21.6*  HGB 14.2  HCT 42.4  MCV 97.7  PLT 304   Basic Metabolic Panel: Recent Labs  Lab 08/12/24 1209  NA 138  K 3.6  CL 95*  CO2 23  GLUCOSE 172*  BUN 24*  CREATININE 0.83  CALCIUM  10.4*   GFR: Estimated Creatinine Clearance: 51.1 mL/min (by C-G formula based on SCr of 0.83 mg/dL). Liver Function Tests: Recent Labs  Lab 08/12/24 1332  AST 33  ALT 10  ALKPHOS 58  BILITOT 0.8  PROT 7.5  ALBUMIN 4.7   Recent Labs  Lab  08/12/24 1332  LIPASE 22   No results for input(s): AMMONIA in the last 168 hours. Coagulation Profile: No results for input(s): INR, PROTIME in the last 168 hours. Cardiac Enzymes: No results for input(s): CKTOTAL, CKMB, CKMBINDEX, TROPONINI in the last 168 hours. BNP (last 3 results) No results for input(s): PROBNP in the last 8760 hours. HbA1C: No results for input(s): HGBA1C in the last 72 hours. CBG: No results for input(s): GLUCAP in the last 168 hours.  Lipid Profile: No results for input(s): CHOL, HDL, LDLCALC, TRIG, CHOLHDL, LDLDIRECT in the last 72 hours. Thyroid  Function Tests: No results for input(s): TSH, T4TOTAL, FREET4, T3FREE, THYROIDAB in the last 72 hours. Anemia Panel: No results for input(s): VITAMINB12, FOLATE, FERRITIN, TIBC, IRON, RETICCTPCT in the last 72 hours. Urine analysis:    Component Value Date/Time   COLORURINE COLORLESS (A) 08/12/2024 1655   APPEARANCEUR CLEAR 08/12/2024 1655   LABSPEC 1.026 08/12/2024 1655   PHURINE 7.5 08/12/2024 1655   GLUCOSEU NEGATIVE 08/12/2024 1655   HGBUR TRACE (A) 08/12/2024 1655   BILIRUBINUR NEGATIVE 08/12/2024 1655   KETONESUR 15 (A) 08/12/2024 1655   PROTEINUR NEGATIVE 08/12/2024 1655   NITRITE NEGATIVE 08/12/2024 1655   LEUKOCYTESUR NEGATIVE 08/12/2024 1655    Radiological Exams on Admission: CT Angio Chest PE W and/or Wo Contrast Result Date: 08/12/2024 CLINICAL DATA:  Nausea/vomiting/diarrhea, hot flashes and chest pressure with back pain since yesterday EXAM: CT ANGIOGRAPHY CHEST CT ABDOMEN AND PELVIS WITH CONTRAST TECHNIQUE: Multidetector CT imaging of the chest was performed using the standard protocol during bolus administration of intravenous contrast. Multiplanar CT image reconstructions and MIPs were obtained to evaluate the vascular anatomy. Multidetector CT imaging of the abdomen and pelvis was performed using the standard protocol during bolus  administration of intravenous contrast. RADIATION DOSE REDUCTION: This exam was performed according to the departmental dose-optimization program which includes automated exposure control, adjustment of the mA and/or kV according to patient size and/or use of iterative reconstruction technique. CONTRAST:  90mL OMNIPAQUE  IOHEXOL  350 MG/ML SOLN COMPARISON:  08/12/2024, 06/23/2024 FINDINGS: CTA CHEST FINDINGS Cardiovascular: This is a technically adequate evaluation of the pulmonary vasculature. No filling defects or pulmonary emboli. Dilated main pulmonary artery measuring 3.4 cm compatible with pulmonary arterial hypertension. Normal caliber of the thoracic aorta. Assessment of the aortic lumen is limited due to timing of contrast bolus. Stable atherosclerosis throughout the aorta and coronary vasculature. Mediastinum/Nodes: No enlarged mediastinal, hilar, or axillary lymph nodes. Thyroid  gland, trachea, and esophagus demonstrate no significant findings. Lungs/Pleura: No acute airspace disease, effusion, or pneumothorax. The prominent area of fat along the superior extent of the left major fissure, reference image 45/2, is unchanged since prior exams. Central airways are patent. Musculoskeletal: No acute or destructive bony abnormalities. Reconstructed images demonstrate no additional findings. Review of the MIP images confirms the above findings. CT ABDOMEN and PELVIS FINDINGS Hepatobiliary: Large calcified gallstone again noted, with no evidence of acute cholecystitis. The liver is unremarkable. No biliary duct dilation. Pancreas: Unremarkable. No pancreatic ductal dilatation or surrounding inflammatory changes. Spleen: Normal in size without focal abnormality. Adrenals/Urinary Tract: Stable areas of cortical scarring within the upper poles of the kidneys, right greater than left. Otherwise the kidneys enhance normally and symmetrically. No urinary tract calculi or obstructive uropathy. The adrenals and bladder are  unremarkable. Stomach/Bowel: No bowel obstruction or ileus. Normal appendix right lower quadrant. There is marked diverticulosis of the descending and sigmoid colon without evidence of acute diverticulitis. No bowel wall thickening or inflammatory change. Vascular/Lymphatic: Aortic atherosclerosis. No enlarged abdominal or pelvic lymph nodes. Reproductive: Uterus and bilateral adnexa are unremarkable. Other: No free fluid or free intraperitoneal gas. No abdominal wall hernia. Musculoskeletal: No acute or destructive bony abnormalities. Reconstructed images demonstrate no additional findings. Review of the MIP images confirms the above findings. IMPRESSION: Chest: 1. No evidence of pulmonary embolus. 2. No acute intrathoracic process. 3. Dilated main pulmonary artery compatible with pulmonary arterial hypertension. 4. Aortic Atherosclerosis (ICD10-I70.0). Coronary artery atherosclerosis. Abdomen/pelvis: 1.  Cholelithiasis without cholecystitis. 2. Distal colonic diverticulosis without diverticulitis. 3.  Aortic Atherosclerosis (ICD10-I70.0). Electronically Signed   By: Ozell Daring M.D.   On: 08/12/2024 15:24   CT ABDOMEN PELVIS W CONTRAST Result Date: 08/12/2024 CLINICAL DATA:  Nausea/vomiting/diarrhea, hot flashes and chest pressure with back pain since yesterday EXAM: CT ANGIOGRAPHY CHEST CT ABDOMEN AND PELVIS WITH CONTRAST TECHNIQUE: Multidetector CT imaging of the chest was performed using the standard protocol during bolus administration of intravenous contrast. Multiplanar CT image reconstructions and MIPs were obtained to evaluate the vascular anatomy. Multidetector CT imaging of the abdomen and pelvis was performed using the standard protocol during bolus administration of intravenous contrast. RADIATION DOSE REDUCTION: This exam was performed according to the departmental dose-optimization program which includes automated exposure control, adjustment of the mA and/or kV according to patient size and/or  use of iterative reconstruction technique. CONTRAST:  90mL OMNIPAQUE  IOHEXOL  350 MG/ML SOLN COMPARISON:  08/12/2024, 06/23/2024 FINDINGS: CTA CHEST FINDINGS Cardiovascular: This is a technically adequate evaluation of the pulmonary vasculature. No filling defects or pulmonary emboli. Dilated main pulmonary artery measuring 3.4 cm compatible with pulmonary arterial hypertension. Normal caliber of the thoracic aorta. Assessment of the aortic lumen is limited due to timing of contrast bolus. Stable atherosclerosis throughout the aorta and coronary vasculature. Mediastinum/Nodes: No enlarged mediastinal, hilar, or axillary lymph nodes. Thyroid  gland, trachea, and esophagus demonstrate no significant findings. Lungs/Pleura: No acute airspace disease, effusion, or pneumothorax. The prominent area of fat along the superior extent of the left major fissure, reference image 45/2, is unchanged since prior exams. Central airways are patent. Musculoskeletal: No acute or destructive bony abnormalities. Reconstructed images demonstrate no additional findings. Review of the MIP images confirms the above findings. CT ABDOMEN and PELVIS FINDINGS Hepatobiliary: Large calcified gallstone again noted, with no evidence of acute cholecystitis. The liver is unremarkable. No biliary duct dilation. Pancreas: Unremarkable. No pancreatic ductal dilatation or surrounding inflammatory changes. Spleen: Normal in size without focal abnormality. Adrenals/Urinary Tract: Stable areas of cortical scarring within the upper poles of the kidneys, right greater than left. Otherwise the kidneys enhance normally and symmetrically. No urinary tract calculi or obstructive uropathy. The adrenals and bladder are unremarkable. Stomach/Bowel: No bowel obstruction or ileus. Normal appendix right lower quadrant. There is marked diverticulosis of the descending and sigmoid colon without evidence of acute diverticulitis. No bowel wall thickening or inflammatory  change. Vascular/Lymphatic: Aortic atherosclerosis. No enlarged abdominal or pelvic lymph nodes. Reproductive: Uterus and bilateral adnexa are unremarkable. Other: No free fluid or free intraperitoneal gas. No abdominal wall hernia. Musculoskeletal: No acute or destructive bony abnormalities. Reconstructed images demonstrate no additional findings. Review of the MIP images confirms the above findings. IMPRESSION: Chest: 1. No evidence of pulmonary embolus. 2. No acute intrathoracic process. 3. Dilated main pulmonary artery compatible with pulmonary arterial hypertension. 4. Aortic Atherosclerosis (ICD10-I70.0). Coronary artery atherosclerosis. Abdomen/pelvis: 1. Cholelithiasis without cholecystitis. 2. Distal colonic diverticulosis without diverticulitis. 3.  Aortic Atherosclerosis (ICD10-I70.0). Electronically Signed   By: Ozell Daring M.D.   On: 08/12/2024 15:24   DG Chest Port 1 View Result Date: 08/12/2024 EXAM: 1 VIEW(S) XRAY OF THE CHEST 08/12/2024 01:06:12 PM COMPARISON: 02/20/2022 CLINICAL HISTORY: chest pain FINDINGS: LUNGS AND PLEURA: Scarring at left costophrenic angle. No focal pulmonary opacity. No pleural effusion. No pneumothorax. HEART AND MEDIASTINUM: Aortic atherosclerosis. No acute abnormality of the cardiac silhouette. BONES AND SOFT TISSUES: No acute osseous abnormality. IMPRESSION: 1. No acute cardiopulmonary process. 2. Aortic atherosclerosis. 3. Scarring at the left costophrenic angle.  Electronically signed by: Waddell Calk MD 08/12/2024 01:53 PM EST RP Workstation: [id number]    EKG: Independently reviewed. ***  Assessment/Plan Principal Problem:   Chest pain     *** HIV screening*** The patient falls between the ages of 13-64 and should be screened for HIV, therefore HIV testing ordered.  DVT prophylaxis: ***  Code Status: *** Family Communication: ***  Disposition Plan: Status is: Inpatient {Inpatient:23812}   Consults called: *** Admission status: *** Level  of care: Level of care: Telemetry The medical decision making on this patient was of high complexity and the patient is at high risk for clinical deterioration, therefore this is a level 3 visit.***  The medical decision making is of moderate complexity, therefore this is a level 2 visit.***  Bradly MARLA Drones MD [facility]  If 7PM-7AM, please contact night-coverage [email or web address]  08/12/2024, 10:44 PM

## 2024-08-12 NOTE — ED Notes (Signed)
 [facility] at bedside to transport pt to [facility]

## 2024-08-12 NOTE — ED Triage Notes (Signed)
 Pt c/o N/V/D, hot flashes and chest pressure and back pain since yesterday.

## 2024-08-12 NOTE — ED Notes (Signed)
 ED Provider at bedside.

## 2024-08-13 ENCOUNTER — Other Ambulatory Visit: Payer: Self-pay

## 2024-08-13 ENCOUNTER — Inpatient Hospital Stay (HOSPITAL_COMMUNITY)

## 2024-08-13 DIAGNOSIS — I3481 Nonrheumatic mitral (valve) annulus calcification: Secondary | ICD-10-CM | POA: Diagnosis not present

## 2024-08-13 DIAGNOSIS — R079 Chest pain, unspecified: Secondary | ICD-10-CM

## 2024-08-13 DIAGNOSIS — I4892 Unspecified atrial flutter: Secondary | ICD-10-CM

## 2024-08-13 DIAGNOSIS — I214 Non-ST elevation (NSTEMI) myocardial infarction: Principal | ICD-10-CM

## 2024-08-13 LAB — CBC
HCT: 40.8 % (ref 36.0–46.0)
Hemoglobin: 13.6 g/dL (ref 12.0–15.0)
MCH: 31.9 pg (ref 26.0–34.0)
MCHC: 33.3 g/dL (ref 30.0–36.0)
MCV: 95.8 fL (ref 80.0–100.0)
Platelets: 226 K/uL (ref 150–400)
RBC: 4.26 MIL/uL (ref 3.87–5.11)
RDW: 13 % (ref 11.5–15.5)
WBC: 16.7 K/uL — ABNORMAL HIGH (ref 4.0–10.5)
nRBC: 0 % (ref 0.0–0.2)

## 2024-08-13 LAB — BASIC METABOLIC PANEL WITH GFR
Anion gap: 14 (ref 5–15)
BUN: 11 mg/dL (ref 8–23)
CO2: 20 mmol/L — ABNORMAL LOW (ref 22–32)
Calcium: 8.6 mg/dL — ABNORMAL LOW (ref 8.9–10.3)
Chloride: 102 mmol/L (ref 98–111)
Creatinine, Ser: 0.9 mg/dL (ref 0.44–1.00)
GFR, Estimated: >60 mL/min (ref >60)
Glucose, Bld: 120 mg/dL — ABNORMAL HIGH (ref 70–99)
Potassium: 3.7 mmol/L (ref 3.5–5.1)
Sodium: 136 mmol/L (ref 135–145)

## 2024-08-13 LAB — TSH: TSH: 5.146 u[IU]/mL — ABNORMAL HIGH (ref 0.350–4.500)

## 2024-08-13 LAB — HEPARIN LEVEL (UNFRACTIONATED)
Heparin Unfractionated: 0.48 [IU]/mL (ref 0.30–0.70)
Heparin Unfractionated: 0.43 [IU]/mL (ref 0.30–0.70)

## 2024-08-13 LAB — ECHOCARDIOGRAM COMPLETE
AR max vel: 1.73 cm2
AV Peak grad: 7.4 mmHg
Ao pk vel: 1.36 m/s
Area-P 1/2: 4.1 cm2
Height: 65 in
S' Lateral: 3.4 cm
Weight: 2408 [oz_av]

## 2024-08-13 LAB — TROPONIN I (HIGH SENSITIVITY): Troponin I (High Sensitivity): 2530 ng/L (ref <18)

## 2024-08-13 LAB — MAGNESIUM: Magnesium: 1.9 mg/dL (ref 1.7–2.4)

## 2024-08-13 LAB — CK: Total CK: 343 U/L — ABNORMAL HIGH (ref 38–234)

## 2024-08-13 LAB — CREATININE, SERUM
Creatinine, Ser: 0.76 mg/dL (ref 0.44–1.00)
GFR, Estimated: >60 mL/min (ref >60)

## 2024-08-13 LAB — SEDIMENTATION RATE: Sed Rate: 3 mm/h (ref 0–22)

## 2024-08-13 LAB — C-REACTIVE PROTEIN: CRP: 0.5 mg/dL (ref <1.0)

## 2024-08-13 MED ORDER — HEPARIN BOLUS VIA INFUSION
4000.0000 [IU] | Freq: Once | INTRAVENOUS | Status: AC
  Administered 2024-08-13: 4000 [IU] via INTRAVENOUS
  Filled 2024-08-13: qty 4000

## 2024-08-13 MED ORDER — AMIODARONE HCL IN DEXTROSE 360-4.14 MG/200ML-% IV SOLN: 30.0000 mg/h | INTRAVENOUS | Status: DC

## 2024-08-13 MED ORDER — METOPROLOL TARTRATE 12.5 MG HALF TABLET
12.5000 mg | ORAL_TABLET | Freq: Two times a day (BID) | ORAL | Status: DC
  Administered 2024-08-13 – 2024-08-15 (×5): 12.5 mg via ORAL
  Filled 2024-08-13 (×5): qty 1

## 2024-08-13 MED ORDER — ONDANSETRON HCL 4 MG/2ML IJ SOLN
4.0000 mg | Freq: Four times a day (QID) | INTRAMUSCULAR | Status: DC | PRN
Start: 2024-08-13 — End: 2024-08-13

## 2024-08-13 MED ORDER — PIPERACILLIN-TAZOBACTAM 3.375 G IVPB
3.3750 g | Freq: Three times a day (TID) | INTRAVENOUS | Status: DC
  Administered 2024-08-13 – 2024-08-15 (×7): 3.375 g via INTRAVENOUS
  Filled 2024-08-13 (×8): qty 50

## 2024-08-13 MED ORDER — AMIODARONE LOAD VIA INFUSION: 150.0000 mg | Freq: Once | INTRAVENOUS | Status: DC

## 2024-08-13 MED ORDER — VANCOMYCIN HCL IN DEXTROSE 1-5 GM/200ML-% IV SOLN: 1000.0000 mg | INTRAVENOUS | Status: DC

## 2024-08-13 MED ORDER — SODIUM CHLORIDE 0.9 % IV SOLN: INTRAVENOUS | Status: AC

## 2024-08-13 MED ORDER — HYDROCODONE-ACETAMINOPHEN 5-325 MG PO TABS: 1.0000 | ORAL_TABLET | ORAL | Status: DC | PRN

## 2024-08-13 MED ORDER — PROCHLORPERAZINE EDISYLATE 10 MG/2ML IJ SOLN: 10.0000 mg | Freq: Four times a day (QID) | INTRAMUSCULAR | Status: DC | PRN

## 2024-08-13 MED ORDER — HEPARIN (PORCINE) 25000 UT/250ML-% IV SOLN
1000.0000 [IU]/h | INTRAVENOUS | Status: DC
  Administered 2024-08-13 – 2024-08-14 (×3): 1000 [IU]/h via INTRAVENOUS
  Filled 2024-08-13 (×3): qty 250

## 2024-08-13 MED ORDER — AMIODARONE HCL IN DEXTROSE 360-4.14 MG/200ML-% IV SOLN: 60.0000 mg/h | INTRAVENOUS | Status: DC

## 2024-08-13 MED ORDER — DILTIAZEM LOAD VIA INFUSION
10.0000 mg | Freq: Once | INTRAVENOUS | Status: DC
  Filled 2024-08-13: qty 10

## 2024-08-13 MED ORDER — ACETAMINOPHEN 650 MG RE SUPP: 650.0000 mg | Freq: Four times a day (QID) | RECTAL | Status: DC | PRN

## 2024-08-13 MED ORDER — ASPIRIN 81 MG PO TBEC
81.0000 mg | DELAYED_RELEASE_TABLET | Freq: Every day | ORAL | Status: DC
  Administered 2024-08-13 – 2024-08-14 (×2): 81 mg via ORAL
  Filled 2024-08-13 (×2): qty 1

## 2024-08-13 MED ORDER — DILTIAZEM HCL 25 MG/5ML IV SOLN
10.0000 mg | Freq: Once | INTRAVENOUS | Status: AC
  Administered 2024-08-13: 10 mg via INTRAVENOUS
  Filled 2024-08-13: qty 5

## 2024-08-13 MED ORDER — ONDANSETRON HCL 4 MG PO TABS
4.0000 mg | ORAL_TABLET | Freq: Four times a day (QID) | ORAL | Status: DC | PRN
Start: 2024-08-13 — End: 2024-08-13

## 2024-08-13 MED ORDER — NITROGLYCERIN 0.4 MG SL SUBL
0.4000 mg | SUBLINGUAL_TABLET | SUBLINGUAL | Status: DC | PRN
  Filled 2024-08-13: qty 1

## 2024-08-13 MED ORDER — ENOXAPARIN SODIUM 40 MG/0.4ML IJ SOSY: 40.0000 mg | PREFILLED_SYRINGE | INTRAMUSCULAR | Status: DC

## 2024-08-13 MED ORDER — MORPHINE SULFATE (PF) 2 MG/ML IV SOLN
2.0000 mg | INTRAVENOUS | Status: DC | PRN
  Administered 2024-08-13: 2 mg via INTRAVENOUS
  Filled 2024-08-13: qty 1

## 2024-08-13 MED ORDER — PROCHLORPERAZINE EDISYLATE 10 MG/2ML IJ SOLN
INTRAMUSCULAR | Status: AC
  Administered 2024-08-13: 10 mg
  Filled 2024-08-13: qty 2

## 2024-08-13 MED ORDER — LORAZEPAM 2 MG/ML IJ SOLN: 1.0000 mg | Freq: Four times a day (QID) | INTRAMUSCULAR | Status: DC | PRN

## 2024-08-13 MED ORDER — DILTIAZEM HCL-DEXTROSE 125-5 MG/125ML-% IV SOLN (PREMIX): 5.0000 mg/h | INTRAVENOUS | Status: DC

## 2024-08-13 MED ORDER — METOPROLOL TARTRATE 12.5 MG HALF TABLET: 12.5000 mg | ORAL_TABLET | Freq: Three times a day (TID) | ORAL | Status: DC

## 2024-08-13 MED ORDER — VANCOMYCIN HCL IN DEXTROSE 1-5 GM/200ML-% IV SOLN
1000.0000 mg | INTRAVENOUS | Status: DC
  Administered 2024-08-14 – 2024-08-15 (×2): 1000 mg via INTRAVENOUS
  Filled 2024-08-13 (×2): qty 200

## 2024-08-13 MED ORDER — ENOXAPARIN SODIUM 80 MG/0.8ML IJ SOSY: 1.0000 mg/kg | PREFILLED_SYRINGE | Freq: Two times a day (BID) | INTRAMUSCULAR | Status: DC

## 2024-08-13 MED ORDER — VANCOMYCIN HCL 1250 MG/250ML IV SOLN
1250.0000 mg | Freq: Once | INTRAVENOUS | Status: AC
  Administered 2024-08-13: 1250 mg via INTRAVENOUS
  Filled 2024-08-13: qty 250

## 2024-08-13 MED ORDER — ACETAMINOPHEN 325 MG PO TABS: 650.0000 mg | ORAL_TABLET | Freq: Four times a day (QID) | ORAL | Status: DC | PRN

## 2024-08-13 MED ORDER — DILTIAZEM HCL-DEXTROSE 125-5 MG/125ML-% IV SOLN (PREMIX)
5.0000 mg/h | INTRAVENOUS | Status: DC
  Filled 2024-08-13: qty 125

## 2024-08-13 NOTE — Progress Notes (Signed)
 PHARMACY - ANTICOAGULATION CONSULT NOTE  Pharmacy Consult for heparin  Indication: chest pain/ACS  Allergies  Allergen Reactions   Ciprofloxacin Rash   Metronidazole Rash    Patient Measurements: Height: 5' 5 (165.1 cm) Weight: 68.3 kg (150 lb 8 oz) IBW/kg (Calculated) : 57 HEPARIN  DW (KG): 68.3  Vital Signs: Temp: 98.3 F (36.8 C) (11/22 0038) Temp Source: Oral (11/22 0038) BP: 114/63 (11/22 0038) Pulse Rate: 78 (11/22 0038)  Labs: Recent Labs    08/12/24 1209 08/13/24 0226  HGB 14.2 13.6  HCT 42.4 40.8  PLT 304 226  CREATININE 0.83 0.76    Estimated Creatinine Clearance: 53 mL/min (by C-G formula based on SCr of 0.76 mg/dL).   Medical History: Past Medical History:  Diagnosis Date   Arthritis    Cholecystolithiasis    Colon polyps    Diverticulosis    Food poisoning    GERD (gastroesophageal reflux disease)    Heart murmur    High blood pressure    Hyperlipidemia    Microscopic colitis    Osteoporosis    PAD (peripheral artery disease) 01/16/2023   Lower extremity arterial Dopplers 02/06/2023: Right-no stenosis, mild-moderate atherosclerosis throughout especially in CFA and calf vessels  ABIs 02/06/2023: Right 0.91, left 1.12   Sjogren's disease    Sleep apnea    wears appliance   Assessment: 6 yoF presented with hot flashes and N/V/D with reported chest pain on 11/20. Previous heparin  consult for ACS, but low suspicion. Pharmacy re-consulted to dose heparin  for atrial fibrillation  -CBC WNL -No PTA anticoagulation -Previous heparin  bolus/infusion not started -No DVT or therapeutic lovenox  given   Goal of Therapy:  Heparin  level 0.3-0.7 units/ml Monitor platelets by anticoagulation protocol: Yes   Plan:  Give 4000 units bolus x 1 Start heparin  infusion at 1000 units/hr Check anti-Xa level in 8 hours and daily while on heparin  Continue to monitor H&H and platelets  Lynwood Poplar, PharmD, BCPS Clinical Pharmacist 08/13/2024 4:39 AM

## 2024-08-13 NOTE — Progress Notes (Signed)
    Patient converted to SR prior to start of IV Dilt. Echo shows LVEF of 40-45%, akinesis of the distal septum and hypokinesis of the apex, normal RV.  -- DC Dilt order, add metoprolol  12.5mg  BID   Bonney Manuelita Rummer, NP-C 08/13/2024, 12:47 PM Pager: [phone]

## 2024-08-13 NOTE — Progress Notes (Signed)
 Pharmacy Antibiotic Note  Loretta Austin is a 78 y.o. female admitted on 08/12/2024 with concern for infectious process.  Pharmacy has been consulted for vancomycin  dosing.  Plan: Vancomycin  1250mg  x1 then 1000mg  IV Q 24 hrs. Goal AUC 400-550.  Expected AUC 440.  Height: 5' 5 (165.1 cm) Weight: 68.3 kg (150 lb 8 oz) IBW/kg (Calculated) : 57  Temp (24hrs), Avg:98.1 F (36.7 C), Min:97.5 F (36.4 C), Max:98.5 F (36.9 C)  Recent Labs  Lab 08/12/24 1209  WBC 21.6*  CREATININE 0.83    Estimated Creatinine Clearance: 51.1 mL/min (by C-G formula based on SCr of 0.83 mg/dL).    Allergies  Allergen Reactions   Ciprofloxacin Rash   Metronidazole Rash    Thank you for allowing pharmacy to be a part of this patient's care.  Marvetta Dauphin, PharmD, BCPS  08/13/2024 12:24 AM

## 2024-08-13 NOTE — Consult Note (Signed)
 Cardiology Consultation   Patient ID: Loretta Austin MRN: [id number]; DOB: 1945-12-09  Admit date: 08/12/2024 Date of Consult: 08/13/2024  PCP:  Yolande Toribio MATSU, MD   [facility] Providers Cardiologist:  Aleene Passe, MD (Inactive)     Patient Profile: Loretta Austin is a 78 y.o. female with a hx of HTN, CAC with elevated coronary calcium  score, OSA, collagenous colitis, RA and HLD who is being seen 08/13/2024 for the evaluation of elevated troponin at the request of Dr. Vernon.  History of Present Illness: Loretta Austin is a 78 yo female with PMH noted above. She has been followed by Dr. Passe as an outpatient, initially referred for CAC by her PCP. This was ordered based off her family hx of CAD, no anginal symptoms were initially reported. She underwent lexiscan  myoview  11/2020 which was normal. Last seen in the office on 12/2023 and noted in her usual state of health.   Follows with GI, seen 03/2024 in the setting of collagenous colitis, chronic diarrhea and underwent upper endoscopy/colonoscopy. Had 4 total polyps removed, otherwise normal findings. Biopsies were + for tubular adenoma with recs for repeat colonoscopy in 3 years.   Seen by hematology 06/2024 for leukocytosis with neutrophilia which was felt to be secondary to her autoimmune disease.  Overall workup was unrevealing.  Presented to the ED on 11/21 with complaints of nausea, vomiting, hot flashes and fatigue.  In talking to the patient, she reports that she previously had been going to [employer] on a regular basis.  Has not gone in the past 2 secondary to increased fatigue.  She has also been having intermittent hot flashes.  Thinks she may have had some palpitations from time to time.  The day prior to admission she reports that she ate dinner and had an episode of vomiting afterwards, thinks that it may have been related to her food.  Woke up the next day and ate breakfast and vomited again.  Also had severe  hot flashes with diaphoresis.  No chest pain at that time.   In the ED labs showed sodium 138, potassium 3.6, creatinine 0.8, high-sensitivity troponin 365, 676, WBC 21.6, hemoglobin 14.2.  EKG showed sinus bradycardia, 59 bpm with T wave inversion in V1 V2.  CT chest abdomen pelvis with no PE, dilated main pulmonary artery compatible with pulmonary arterial hypertension, cholelithiasis without cholecystitis, no diverticulitis, stable atherosclerosis throughout the aorta and coronary vasculature. she was started on IV heparin  as well as antibiotics and transferred to [facility] under internal medicine.  Overnight developed atrial flutter with RVR with heart rates up into the 160s.  Given one-time dose of IV diltiazem  10 mg.  Also reported having chest pain during that episode and received morphine . Cardiology asked to evaluate.    Past Medical History:  Diagnosis Date   Arthritis    Cholecystolithiasis    Colon polyps    Diverticulosis    Food poisoning    GERD (gastroesophageal reflux disease)    Heart murmur    High blood pressure    Hyperlipidemia    Microscopic colitis    Osteoporosis    PAD (peripheral artery disease) 01/16/2023   Lower extremity arterial Dopplers 02/06/2023: Right-no stenosis, mild-moderate atherosclerosis throughout especially in CFA and calf vessels  ABIs 02/06/2023: Right 0.91, left 1.12   Sjogren's disease    Sleep apnea    wears appliance    Past Surgical History:  Procedure Laterality Date   COLONOSCOPY  04/19/2021   02/12/16-at  [place]   TONSILLECTOMY AND ADENOIDECTOMY     TUBAL LIGATION       Scheduled Meds:  aspirin  EC  81 mg Oral Daily   diltiazem   10 mg Intravenous Once   Continuous Infusions:  sodium chloride  100 mL/hr at 08/13/24 0420   diltiazem  (CARDIZEM ) infusion     heparin  1,000 Units/hr (08/13/24 0525)   piperacillin -tazobactam (ZOSYN )  IV Stopped (08/13/24 0306)   [START ON 08/14/2024] vancomycin      PRN Meds: acetaminophen  **OR**  acetaminophen , HYDROcodone -acetaminophen , LORazepam , morphine  injection, nitroGLYCERIN , prochlorperazine   Allergies:    Allergies  Allergen Reactions   Ciprofloxacin Rash   Metronidazole Rash    Social History:   Social History   Socioeconomic History   Marital status: Widowed    Spouse name: Not on file   Number of children: 1   Years of education: Not on file   Highest education level: Not on file  Occupational History   Occupation: retired  Tobacco Use   Smoking status: Former    Current packs/day: 0.00    Types: Cigarettes    Quit date: 09/23/1983    Years since quitting: 40.9   Smokeless tobacco: Never  Vaping Use   Vaping status: Never Used  Substance and Sexual Activity   Alcohol  use: Yes    Alcohol /week: 1.0 standard drink of alcohol     Types: 1 Standard drinks or equivalent per week    Comment: occ   Drug use: Not Currently   Sexual activity: Not Currently    Partners: Male  Other Topics Concern   Not on file  Social History Narrative   Not on file   Social Drivers of Health   Financial Resource Strain: Not on file  Food Insecurity: No Food Insecurity (07/19/2024)   Hunger Vital Sign    Worried About Running Out of Food in the Last Year: Never true    Ran Out of Food in the Last Year: Never true  Transportation Needs: No Transportation Needs (07/19/2024)   PRAPARE - Administrator, Civil Service (Medical): No    Lack of Transportation (Non-Medical): No  Physical Activity: Not on file  Stress: Not on file  Social Connections: Not on file  Intimate Partner Violence: Not on file    Family History:    Family History  Problem Relation Age of Onset   Lung cancer Mother    Colon cancer Mother 63   Esophageal cancer Neg Hx    Rectal cancer Neg Hx    Stomach cancer Neg Hx    Colon polyps Neg Hx      ROS:  Please see the history of present illness.   All other ROS reviewed and negative.     Physical Exam/Data: Vitals:   08/13/24  0443 08/13/24 0513 08/13/24 0637 08/13/24 0847  BP: 102/60 107/66 (!) 113/90 138/79  Pulse: (!) 126 (!) 46 (!) 126 (!) 130  Resp: 19 20 19 19   Temp:    98.4 F (36.9 C)  TempSrc:    Oral  SpO2: 98% 98% 96% 96%  Weight:      Height:        Intake/Output Summary (Last 24 hours) at 08/13/2024 1025 Last data filed at 08/12/2024 1830 Gross per 24 hour  Intake 100 ml  Output --  Net 100 ml      08/12/2024   10:28 PM 08/12/2024   12:06 PM 07/19/2024    2:44 PM  Last 3 Weights  Weight (lbs) 150  lb 8 oz 149 lb 154 lb 1.6 oz  Weight (kg) 68.266 kg 67.586 kg 69.899 kg     Body mass index is 25.04 kg/m.  General:  Well nourished, well developed, in no acute distress HEENT: normal Neck: no JVD Vascular: No carotid bruits; Distal pulses 2+ bilaterally Cardiac:  normal S1, S2; Tachycardic; no murmur  Lungs:  clear to auscultation bilaterally Abd: soft, nontender, no hepatomegaly  Ext: no edema Musculoskeletal:  No deformities, BUE and BLE strength normal and equal Skin: warm and dry  Neuro: no focal abnormalities noted Psych:  Normal affect   EKG:  The EKG was personally reviewed and demonstrates:  11/21- sinus bradycardia, 59 bpm with T wave inversion in V1 and V2 11/22, atrial flutter with RVR, 118 bpm, continued T wave inversion in V1 V2  Telemetry:  Telemetry was personally reviewed and demonstrates: Atrial flutter with RVR, rates between 110-140  Relevant CV Studies:  Echo: pending  Laboratory Data: High Sensitivity Troponin:   Recent Labs  Lab 08/13/24 0657  TROPONINIHS 2,530*     Chemistry Recent Labs  Lab 08/12/24 1209 08/13/24 0226 08/13/24 0656  NA 138  --  136  K 3.6  --  3.7  CL 95*  --  102  CO2 23  --  20*  GLUCOSE 172*  --  120*  BUN 24*  --  11  CREATININE 0.83 0.76 0.90  CALCIUM  10.4*  --  8.6*  MG  --   --  1.9  GFRNONAA >60 >60 >60  ANIONGAP 20*  --  14    Recent Labs  Lab 08/12/24 1332  PROT 7.5  ALBUMIN 4.7  AST 33  ALT 10   ALKPHOS 58  BILITOT 0.8   Lipids No results for input(s): CHOL, TRIG, HDL, LABVLDL, LDLCALC, CHOLHDL in the last 168 hours.  Hematology Recent Labs  Lab 08/12/24 1209 08/13/24 0226  WBC 21.6* 16.7*  RBC 4.34 4.26  HGB 14.2 13.6  HCT 42.4 40.8  MCV 97.7 95.8  MCH 32.7 31.9  MCHC 33.5 33.3  RDW 12.9 13.0  PLT 304 226   Thyroid  No results for input(s): TSH, FREET4 in the last 168 hours.  BNPNo results for input(s): BNP, PROBNP in the last 168 hours.  DDimer No results for input(s): DDIMER in the last 168 hours.  Radiology/Studies:  CT Angio Chest PE W and/or Wo Contrast Result Date: 08/12/2024 CLINICAL DATA:  Nausea/vomiting/diarrhea, hot flashes and chest pressure with back pain since yesterday EXAM: CT ANGIOGRAPHY CHEST CT ABDOMEN AND PELVIS WITH CONTRAST TECHNIQUE: Multidetector CT imaging of the chest was performed using the standard protocol during bolus administration of intravenous contrast. Multiplanar CT image reconstructions and MIPs were obtained to evaluate the vascular anatomy. Multidetector CT imaging of the abdomen and pelvis was performed using the standard protocol during bolus administration of intravenous contrast. RADIATION DOSE REDUCTION: This exam was performed according to the departmental dose-optimization program which includes automated exposure control, adjustment of the mA and/or kV according to patient size and/or use of iterative reconstruction technique. CONTRAST:  90mL OMNIPAQUE  IOHEXOL  350 MG/ML SOLN COMPARISON:  08/12/2024, 06/23/2024 FINDINGS: CTA CHEST FINDINGS Cardiovascular: This is a technically adequate evaluation of the pulmonary vasculature. No filling defects or pulmonary emboli. Dilated main pulmonary artery measuring 3.4 cm compatible with pulmonary arterial hypertension. Normal caliber of the thoracic aorta. Assessment of the aortic lumen is limited due to timing of contrast bolus. Stable atherosclerosis throughout the aorta  and coronary vasculature. Mediastinum/Nodes: No enlarged  mediastinal, hilar, or axillary lymph nodes. Thyroid  gland, trachea, and esophagus demonstrate no significant findings. Lungs/Pleura: No acute airspace disease, effusion, or pneumothorax. The prominent area of fat along the superior extent of the left major fissure, reference image 45/2, is unchanged since prior exams. Central airways are patent. Musculoskeletal: No acute or destructive bony abnormalities. Reconstructed images demonstrate no additional findings. Review of the MIP images confirms the above findings. CT ABDOMEN and PELVIS FINDINGS Hepatobiliary: Large calcified gallstone again noted, with no evidence of acute cholecystitis. The liver is unremarkable. No biliary duct dilation. Pancreas: Unremarkable. No pancreatic ductal dilatation or surrounding inflammatory changes. Spleen: Normal in size without focal abnormality. Adrenals/Urinary Tract: Stable areas of cortical scarring within the upper poles of the kidneys, right greater than left. Otherwise the kidneys enhance normally and symmetrically. No urinary tract calculi or obstructive uropathy. The adrenals and bladder are unremarkable. Stomach/Bowel: No bowel obstruction or ileus. Normal appendix right lower quadrant. There is marked diverticulosis of the descending and sigmoid colon without evidence of acute diverticulitis. No bowel wall thickening or inflammatory change. Vascular/Lymphatic: Aortic atherosclerosis. No enlarged abdominal or pelvic lymph nodes. Reproductive: Uterus and bilateral adnexa are unremarkable. Other: No free fluid or free intraperitoneal gas. No abdominal wall hernia. Musculoskeletal: No acute or destructive bony abnormalities. Reconstructed images demonstrate no additional findings. Review of the MIP images confirms the above findings. IMPRESSION: Chest: 1. No evidence of pulmonary embolus. 2. No acute intrathoracic process. 3. Dilated main pulmonary artery compatible  with pulmonary arterial hypertension. 4. Aortic Atherosclerosis (ICD10-I70.0). Coronary artery atherosclerosis. Abdomen/pelvis: 1. Cholelithiasis without cholecystitis. 2. Distal colonic diverticulosis without diverticulitis. 3.  Aortic Atherosclerosis (ICD10-I70.0). Electronically Signed   By: Ozell Daring M.D.   On: 08/12/2024 15:24   CT ABDOMEN PELVIS W CONTRAST Result Date: 08/12/2024 CLINICAL DATA:  Nausea/vomiting/diarrhea, hot flashes and chest pressure with back pain since yesterday EXAM: CT ANGIOGRAPHY CHEST CT ABDOMEN AND PELVIS WITH CONTRAST TECHNIQUE: Multidetector CT imaging of the chest was performed using the standard protocol during bolus administration of intravenous contrast. Multiplanar CT image reconstructions and MIPs were obtained to evaluate the vascular anatomy. Multidetector CT imaging of the abdomen and pelvis was performed using the standard protocol during bolus administration of intravenous contrast. RADIATION DOSE REDUCTION: This exam was performed according to the departmental dose-optimization program which includes automated exposure control, adjustment of the mA and/or kV according to patient size and/or use of iterative reconstruction technique. CONTRAST:  90mL OMNIPAQUE  IOHEXOL  350 MG/ML SOLN COMPARISON:  08/12/2024, 06/23/2024 FINDINGS: CTA CHEST FINDINGS Cardiovascular: This is a technically adequate evaluation of the pulmonary vasculature. No filling defects or pulmonary emboli. Dilated main pulmonary artery measuring 3.4 cm compatible with pulmonary arterial hypertension. Normal caliber of the thoracic aorta. Assessment of the aortic lumen is limited due to timing of contrast bolus. Stable atherosclerosis throughout the aorta and coronary vasculature. Mediastinum/Nodes: No enlarged mediastinal, hilar, or axillary lymph nodes. Thyroid  gland, trachea, and esophagus demonstrate no significant findings. Lungs/Pleura: No acute airspace disease, effusion, or pneumothorax. The  prominent area of fat along the superior extent of the left major fissure, reference image 45/2, is unchanged since prior exams. Central airways are patent. Musculoskeletal: No acute or destructive bony abnormalities. Reconstructed images demonstrate no additional findings. Review of the MIP images confirms the above findings. CT ABDOMEN and PELVIS FINDINGS Hepatobiliary: Large calcified gallstone again noted, with no evidence of acute cholecystitis. The liver is unremarkable. No biliary duct dilation. Pancreas: Unremarkable. No pancreatic ductal dilatation or surrounding inflammatory changes. Spleen: Normal  in size without focal abnormality. Adrenals/Urinary Tract: Stable areas of cortical scarring within the upper poles of the kidneys, right greater than left. Otherwise the kidneys enhance normally and symmetrically. No urinary tract calculi or obstructive uropathy. The adrenals and bladder are unremarkable. Stomach/Bowel: No bowel obstruction or ileus. Normal appendix right lower quadrant. There is marked diverticulosis of the descending and sigmoid colon without evidence of acute diverticulitis. No bowel wall thickening or inflammatory change. Vascular/Lymphatic: Aortic atherosclerosis. No enlarged abdominal or pelvic lymph nodes. Reproductive: Uterus and bilateral adnexa are unremarkable. Other: No free fluid or free intraperitoneal gas. No abdominal wall hernia. Musculoskeletal: No acute or destructive bony abnormalities. Reconstructed images demonstrate no additional findings. Review of the MIP images confirms the above findings. IMPRESSION: Chest: 1. No evidence of pulmonary embolus. 2. No acute intrathoracic process. 3. Dilated main pulmonary artery compatible with pulmonary arterial hypertension. 4. Aortic Atherosclerosis (ICD10-I70.0). Coronary artery atherosclerosis. Abdomen/pelvis: 1. Cholelithiasis without cholecystitis. 2. Distal colonic diverticulosis without diverticulitis. 3.  Aortic  Atherosclerosis (ICD10-I70.0). Electronically Signed   By: Ozell Daring M.D.   On: 08/12/2024 15:24   DG Chest Port 1 View Result Date: 08/12/2024 EXAM: 1 VIEW(S) XRAY OF THE CHEST 08/12/2024 01:06:12 PM COMPARISON: 02/20/2022 CLINICAL HISTORY: chest pain FINDINGS: LUNGS AND PLEURA: Scarring at left costophrenic angle. No focal pulmonary opacity. No pleural effusion. No pneumothorax. HEART AND MEDIASTINUM: Aortic atherosclerosis. No acute abnormality of the cardiac silhouette. BONES AND SOFT TISSUES: No acute osseous abnormality. IMPRESSION: 1. No acute cardiopulmonary process. 2. Aortic atherosclerosis. 3. Scarring at the left costophrenic angle. Electronically signed by: Waddell Calk MD 08/12/2024 01:53 PM EST RP Workstation: [id number]     Assessment and Plan:  Loretta Austin is a 78 y.o. female with a hx of HTN, CAC with elevated coronary calcium  score, OSA, collagenous colitis, RA and HLD who is being seen 08/13/2024 for the evaluation of elevated troponin at the request of Dr. Vernon.  NSTEMI -- History of elevated coronary calcium  score of over 500 back in 2021.  Had an outpatient Myoview  that was normal at the time.   -- Presents with symptoms of nausea, vomiting, increased fatigue over the past 2 months.  She denies any chest pain prior to admission but did have episodes of chest discomfort while in atrial flutter with RVR last evening. -- hsTn 365>> 676>> 2530, EKG with TWI in v1-v2  -- CT chest abdomen pelvis with stable atherosclerosis throughout the aorta and coronary vasculature -- Remains on IV heparin , continue aspirin , add statin  -- echo pending -- Will need to undergo ischemic evaluation, plan for cardiac catheterization on Monday  Informed Consent   Shared Decision Making/Informed Consent{ The risks [stroke (1 in 1000), death (1 in 1000), kidney failure [usually temporary] (1 in 500), bleeding (1 in 200), allergic reaction [possibly serious] (1 in 200)], benefits  (diagnostic support and management of coronary artery disease) and alternatives of a cardiac catheterization were discussed in detail with Ms. Jelinek and she is willing to proceed.    New onset atrial flutter with RVR -- Reports episode of severe hot flashes with diaphoresis prior to admission, thinks she may have had palpitations with these episodes as well. -- developed episode last evening with atrial flutter with RVR with associated hot flashes and diaphoresis. -- Initially given IV diltiazem  10 mg x 1, remains in atrial flutter with elevated rates -- No signs of heart failure on exam, will start IV diltiazem , continue IV heparin  -- Discussed the need for potential TEE/DCCV if  she does not convert with medications -- check TSH  Nausea/vomiting -- episodes after eating a meal, thinks it didn't sit well with her.  -- no further episodes  Leukocytosis -- This has been an ongoing issue for quite some time.  She was seen by hematology as an outpatient with workup unrevealing.  It was felt that her leukocytosis was likely secondary to her autoimmune disease as well as possible collagenous colitis -- BC NTD -- Overall does not appear to have any infectious symptoms, was placed on Van and Zosyn  on admission, management per primary  Per primary RA Collagenous colitis   Risk Assessment/Risk Scores:  TIMI Risk Score for Unstable Angina or Non-ST Elevation MI:   The patient's TIMI risk score is 2, which indicates a 8% risk of all cause mortality, new or recurrent myocardial infarction or need for urgent revascularization in the next 14 days.    CHA2DS2-VASc Score = 4  This indicates a 4.8% annual risk of stroke. The patient's score is based upon: CHF History: 0 HTN History: 1 Diabetes History: 0 Stroke History: 0 Vascular Disease History: 0 Age Score: 2 Gender Score: 1   For questions or updates, please contact [facility] Please consult [email or web address] for contact info  under    Signed, Manuelita Rummer, NP  08/13/2024 10:25 AM

## 2024-08-13 NOTE — Progress Notes (Signed)
 At approximately 0331 received call from CCMD, patient converted to AFLUTTER. When I arrived to the bedside patient sitting up right complaining of nausea and hot flashes. Contacted physician at [phone] order for EKG placed. EKG completed at bedside confirmed AFLUTTER status. Patent began complaining of medial chest pain 8/10. Physician now at bedside, prochlorperazine  (COMPAZINE ) 10 MG/2ML injection administered at 0355 to relieve nausea, diltiazem  (CARDIZEM ) injection 10 mg given at 0357. For heart rhythm. Nausea was relieved by Compazine , BP and HR slowly improving. Patient still complaining of chest pain 8/10, morphine  (PF) 2 MG/ML injection 2 mg given at 0405 effective.  0.9 % sodium chloride  infusion100 mL/hr initiated at 0420. 0457 patient in bed resting, chest pain has subsided along with nausea and hot flashes. At 0525 Heparin  drip initiated, bolus administered.

## 2024-08-13 NOTE — Plan of Care (Signed)
  Problem: Education: Goal: Knowledge of General Education information will improve Description: Including pain rating scale, medication(s)/side effects and non-pharmacologic comfort measures Outcome: Progressing   Problem: Clinical Measurements: Goal: Ability to maintain clinical measurements within normal limits will improve Outcome: Progressing   Problem: Clinical Measurements: Goal: Will remain free from infection Outcome: Progressing   Problem: Clinical Measurements: Goal: Diagnostic test results will improve Outcome: Progressing   Problem: Safety: Goal: Ability to remain free from injury will improve Outcome: Progressing   Problem: Skin Integrity: Goal: Risk for impaired skin integrity will decrease Outcome: Progressing

## 2024-08-13 NOTE — Progress Notes (Signed)
 Date and time results received: 08/13/24 0755 (use smartphrase .now to insert current time)  Test: Troponin Critical Value: 2530  Name of Provider Notified: Dr. Marlea  Orders Received? Or Actions Taken?: Dr. Marlea at the pt bedside

## 2024-08-13 NOTE — Care Plan (Addendum)
 Overnight the patient was noted on telemetry to have heart up to 160s. EKG was performed and appeared that patient was having atrial flutter. She was given one time dose of cardizem  10 mg IV. The patient complained that she was having chest pain. She was also given morphine  to help with the chest pain at that time. Repeat labs have been ordered and the patient was started on heparin  drip. The patient was also started on fluids.

## 2024-08-13 NOTE — Progress Notes (Signed)
 PHARMACY - ANTICOAGULATION CONSULT NOTE  Pharmacy Consult for heparin  Indication: chest pain/ACS  Allergies  Allergen Reactions   Ciprofloxacin Rash   Metronidazole Rash    Patient Measurements: Height: 5' 5 (165.1 cm) Weight: 68.3 kg (150 lb 8 oz) IBW/kg (Calculated) : 57 HEPARIN  DW (KG): 68.3  Vital Signs: Temp: 98 F (36.7 C) (11/22 2054) Temp Source: Oral (11/22 2054) BP: 132/56 (11/22 2103) Pulse Rate: 84 (11/22 2103)  Labs: Recent Labs    08/12/24 1209 08/13/24 0226 08/13/24 0656 08/13/24 0657 08/13/24 1228 08/13/24 2038  HGB 14.2 13.6  --   --   --   --   HCT 42.4 40.8  --   --   --   --   PLT 304 226  --   --   --   --   HEPARINUNFRC  --   --   --   --  0.48 0.43  CREATININE 0.83 0.76 0.90  --   --   --   CKTOTAL  --   --  343*  --   --   --   TROPONINIHS  --   --   --  2,530*  --   --     Estimated Creatinine Clearance: 47.1 mL/min (by C-G formula based on SCr of 0.9 mg/dL).   Medical History: Past Medical History:  Diagnosis Date   Arthritis    Cholecystolithiasis    Colon polyps    Diverticulosis    Food poisoning    GERD (gastroesophageal reflux disease)    Heart murmur    High blood pressure    Hyperlipidemia    Microscopic colitis    Osteoporosis    PAD (peripheral artery disease) 01/16/2023   Lower extremity arterial Dopplers 02/06/2023: Right-no stenosis, mild-moderate atherosclerosis throughout especially in CFA and calf vessels  ABIs 02/06/2023: Right 0.91, left 1.12   Sjogren's disease    Sleep apnea    wears appliance   Assessment: 50 yoF presented with hot flashes and N/V/D with reported chest pain on 11/20. Previous heparin  consult for ACS, but low suspicion. No AC PTA. Pharmacy re-consulted to dose heparin  for atrial fibrillation.   Heparin  level therapeutic at 0.48. CBC stable: Hgb 13.6, Plt 226. No signs/symptoms of bleeding per RN.   PM update: heparin  level 0.43 is therapeutic on 1000 units/hr. No issues with the  infusion or bleeding reported.  Goal of Therapy:  Heparin  level 0.3-0.7 units/ml Monitor platelets by anticoagulation protocol: Yes   Plan:  Continue heparin  infusion at 1000 units/hr Check anti-Xa level daily while on heparin  Continue to monitor H&H and platelets  Rocky Slade, PharmD, BCPS 08/13/2024 9:33 PM   Please check AMION for all [facility] Pharmacy phone numbers After 10:00 PM, call Main Pharmacy [phone]

## 2024-08-13 NOTE — Progress Notes (Signed)
 Echocardiogram 2D Echocardiogram has been performed.  Loretta Austin 08/13/2024, 11:26 AM

## 2024-08-13 NOTE — Plan of Care (Signed)
  Problem: Education: Goal: Knowledge of General Education information will improve Description: Including pain rating scale, medication(s)/side effects and non-pharmacologic comfort measures Outcome: Progressing   Problem: Health Behavior/Discharge Planning: Goal: Ability to manage health-related needs will improve Outcome: Progressing   Problem: Clinical Measurements: Goal: Ability to maintain clinical measurements within normal limits will improve Outcome: Progressing Goal: Cardiovascular complication will be avoided Outcome: Progressing   Problem: Activity: Goal: Risk for activity intolerance will decrease Outcome: Progressing   Problem: Nutrition: Goal: Adequate nutrition will be maintained Outcome: Progressing   Problem: Coping: Goal: Level of anxiety will decrease Outcome: Progressing   Problem: Elimination: Goal: Will not experience complications related to bowel motility Outcome: Progressing Goal: Will not experience complications related to urinary retention Outcome: Progressing   Problem: Pain Managment: Goal: General experience of comfort will improve and/or be controlled Outcome: Progressing   Problem: Safety: Goal: Ability to remain free from injury will improve Outcome: Progressing   Problem: Skin Integrity: Goal: Risk for impaired skin integrity will decrease Outcome: Progressing

## 2024-08-13 NOTE — Progress Notes (Signed)
 PROGRESS NOTE    Loretta Austin  FMW:[id number] DOB: 10-14-1945 DOA: 08/12/2024 PCP: Yolande Toribio MATSU, MD   Brief Narrative:  HPI: Loretta Austin is a 78 y.o. female with medical history significant of hypertension, CAD, PAD, obstructive sleep apnea, collagenous colitis, rheumatoid arthritis and hyperlipidemia who presents to the hospital with complaint of chest pain. The patient states that the night prior that she had eaten something earlier in the night before and then 3 hours later was having nausea and vomiting. She then finally got to sleep and stated that she woke up with hot flashes. She states that they are so severe when they come on at night and she had associated chest pain at that time. She states that it was a pressure like sensation and she was diaphoretic. She denied any shortness of breath at that time.    She states that she has been having these hot flash symptoms for the past 2-3 months and that she has also had associated weight loss of 14 pounds. She states that she doesn't have much of an appetite. Appears that she was seeing oncology with the same complaint and she was started on Estroven to help.  They evaluated her unintentional weight loss as well and state that all evaluations have been negative thus far.   ED Course:  In the ER, B114/63P, HR 78, RR 15, O2 saturation 98% on RA,  and Tmax 98.5. Cbc demonstrated wbc21.6,  hb/hct 14.2/42.4, and platelet 304. Chemistry demonstrated Na 138, K 3.6, Cl 95, bicarb 23, Bun/Cr 24/0.83 and glucose 172.  Calcium  was 10.4.  anion gap 20. Initial troponin was 365 and repeat was 676.  CXR demonstrated no acute cardiopulmonary findings. Urinalysis was negative for acute infection. EKG initial was sinus bradycardia. Respiratory PCR was negative. CTA chest was negative for PE.  Assessment & Plan:   Principal Problem:   Chest pain  Chest pain/elevated troponin/?  NSTEMI: Presented with chest pain and heart flashes.  Per initial  conversation between ED physician and cardiology/Dr. Pietro, cardiology did not believe that patient had true ACS event and suspected troponin leak due to demand ischemia and did not recommend heparin  drip.  However patient's troponins continue to rise 365> 676> 2530 and the fact that patient overnight developed atrial flutter, she was eventually started on heparin  drip by hospitalist. Per admitting hospitalist note, cardiology is officially consulted but I do not see them attached to the treatment team.  I have consulted cardiology officially/Dr. Acharya.  Echo pending.  New onset atrial flutter: Developed overnight.  Started on heparin  drip.  Received diltiazem .  Rates fluctuating however during my visit, she appeared to be in sinus rhythm with rates around 85.  Hypertension: PTA on irbesartan which is currently on hold, blood pressure controlled.  Hyperlipidemia: Continue statin.  Rheumatoid arthritis: Patient on Humira.  Collagenous colitis: Followed by GI, started on budesonide  daily.  DVT prophylaxis: Heparin    Code Status: Full Code  Family Communication: Daughter present at bedside.  Plan of care discussed with patient in length and he/she verbalized understanding and agreed with it.  Status is: Inpatient Remains inpatient appropriate because: Noticed plan of cardiac cath Monday, she is still in atrial flutter, cardiology is going to start on heparin  drip.   Estimated body mass index is 25.04 kg/m as calculated from the following:   Height as of this encounter: 5' 5 (1.651 m).   Weight as of this encounter: 68.3 kg.    Nutritional Assessment: Body mass index is  25.04 kg/m.SABRA Seen by dietician.  I agree with the assessment and plan as outlined below: Nutrition Status:        . Skin Assessment: I have examined the patient's skin and I agree with the wound assessment as performed by the wound care RN as outlined below:    Consultants:  Cardiology  Procedures:  As  above  Antimicrobials:  Anti-infectives (From admission, onward)    Start     Dose/Rate Route Frequency Ordered Stop   08/13/24 2200  vancomycin  (VANCOCIN ) IVPB 1000 mg/200 mL premix        1,000 mg 200 mL/hr over 60 Minutes Intravenous Every 24 hours 08/13/24 0024     08/13/24 0200  piperacillin -tazobactam (ZOSYN ) IVPB 3.375 g        3.375 g 12.5 mL/hr over 240 Minutes Intravenous Every 8 hours 08/13/24 0004     08/13/24 0115  vancomycin  (VANCOREADY) IVPB 1250 mg/250 mL        1,250 mg 166.7 mL/hr over 90 Minutes Intravenous  Once 08/13/24 0022 08/13/24 0224   08/12/24 1700  cefTRIAXone  (ROCEPHIN ) 1 g in sodium chloride  0.9 % 100 mL IVPB        1 g 200 mL/hr over 30 Minutes Intravenous  Once 08/12/24 1655 08/12/24 1830         Subjective: Patient seen and examined, she was getting echo done.  Daughter at the bedside.  She said she was feeling  crumpy and she attributed that feeling to having hot flashes.  Did not have any chest pain or shortness of breath or palpitation.  Objective: Vitals:   08/13/24 0436 08/13/24 0443 08/13/24 0513 08/13/24 0637  BP:  102/60 107/66 (!) 113/90  Pulse: 74 (!) 126 (!) 46 (!) 126  Resp: 17 19 20 19   Temp:      TempSrc:      SpO2: 98% 98% 98% 96%  Weight:      Height:        Intake/Output Summary (Last 24 hours) at 08/13/2024 0759 Last data filed at 08/12/2024 1830 Gross per 24 hour  Intake 100 ml  Output --  Net 100 ml   Filed Weights   08/12/24 1206 08/12/24 2228  Weight: 67.6 kg 68.3 kg    Examination:  General exam: Appears calm and comfortable  Respiratory system: Clear to auscultation. Respiratory effort normal. Cardiovascular system: S1 & S2 heard, RRR. No JVD, murmurs, rubs, gallops or clicks. No pedal edema. Gastrointestinal system: Abdomen is nondistended, soft and nontender. No organomegaly or masses felt. Normal bowel sounds heard. Central nervous system: Alert and oriented. No focal neurological  deficits. Extremities: Symmetric 5 x 5 power. Skin: No rashes, lesions or ulcers Psychiatry: Judgement and insight appear normal. Mood & affect appropriate.    Data Reviewed: I have personally reviewed following labs and imaging studies  CBC: Recent Labs  Lab 08/12/24 1209 08/13/24 0226  WBC 21.6* 16.7*  HGB 14.2 13.6  HCT 42.4 40.8  MCV 97.7 95.8  PLT 304 226   Basic Metabolic Panel: Recent Labs  Lab 08/12/24 1209 08/13/24 0226  NA 138  --   K 3.6  --   CL 95*  --   CO2 23  --   GLUCOSE 172*  --   BUN 24*  --   CREATININE 0.83 0.76  CALCIUM  10.4*  --    GFR: Estimated Creatinine Clearance: 53 mL/min (by C-G formula based on SCr of 0.76 mg/dL). Liver Function Tests: Recent Labs  Lab 08/12/24  1332  AST 33  ALT 10  ALKPHOS 58  BILITOT 0.8  PROT 7.5  ALBUMIN 4.7   Recent Labs  Lab 08/12/24 1332  LIPASE 22   No results for input(s): AMMONIA in the last 168 hours. Coagulation Profile: No results for input(s): INR, PROTIME in the last 168 hours. Cardiac Enzymes: No results for input(s): CKTOTAL, CKMB, CKMBINDEX, TROPONINI in the last 168 hours. BNP (last 3 results) No results for input(s): PROBNP in the last 8760 hours. HbA1C: No results for input(s): HGBA1C in the last 72 hours. CBG: No results for input(s): GLUCAP in the last 168 hours. Lipid Profile: No results for input(s): CHOL, HDL, LDLCALC, TRIG, CHOLHDL, LDLDIRECT in the last 72 hours. Thyroid  Function Tests: No results for input(s): TSH, T4TOTAL, FREET4, T3FREE, THYROIDAB in the last 72 hours. Anemia Panel: No results for input(s): VITAMINB12, FOLATE, FERRITIN, TIBC, IRON, RETICCTPCT in the last 72 hours. Sepsis Labs: No results for input(s): PROCALCITON, LATICACIDVEN in the last 168 hours.  Recent Results (from the past 240 hours)  Resp panel by RT-PCR (RSV, Flu A&B, Covid)     Status: None   Collection Time: 08/12/24  5:13 PM    Specimen: Nasal Swab  Result Value Ref Range Status   SARS Coronavirus 2 by RT PCR NEGATIVE NEGATIVE Final    Comment: (NOTE) SARS-CoV-2 target nucleic acids are NOT DETECTED.  The SARS-CoV-2 RNA is generally detectable in upper respiratory specimens during the acute phase of infection. The lowest concentration of SARS-CoV-2 viral copies this assay can detect is 138 copies/mL. A negative result does not preclude SARS-Cov-2 infection and should not be used as the sole basis for treatment or other patient management decisions. A negative result may occur with  improper specimen collection/handling, submission of specimen other than nasopharyngeal swab, presence of viral mutation(s) within the areas targeted by this assay, and inadequate number of viral copies(<138 copies/mL). A negative result must be combined with clinical observations, patient history, and epidemiological information. The expected result is Negative.  Fact Sheet for Patients:  bloggercourse.com  Fact Sheet for Healthcare Providers:  seriousbroker.it  This test is no t yet approved or cleared by the [place]  FDA and  has been authorized for detection and/or diagnosis of SARS-CoV-2 by FDA under an Emergency Use Authorization (EUA). This EUA will remain  in effect (meaning this test can be used) for the duration of the COVID-19 declaration under Section 564(b)(1) of the Act, 21 U.S.C.section 360bbb-3(b)(1), unless the authorization is terminated  or revoked sooner.       Influenza A by PCR NEGATIVE NEGATIVE Final   Influenza B by PCR NEGATIVE NEGATIVE Final    Comment: (NOTE) The Xpert Xpress SARS-CoV-2/FLU/RSV plus assay is intended as an aid in the diagnosis of influenza from Nasopharyngeal swab specimens and should not be used as a sole basis for treatment. Nasal washings and aspirates are unacceptable for Xpert Xpress SARS-CoV-2/FLU/RSV testing.  Fact  Sheet for Patients: bloggercourse.com  Fact Sheet for Healthcare Providers: seriousbroker.it  This test is not yet approved or cleared by the [place]  FDA and has been authorized for detection and/or diagnosis of SARS-CoV-2 by FDA under an Emergency Use Authorization (EUA). This EUA will remain in effect (meaning this test can be used) for the duration of the COVID-19 declaration under Section 564(b)(1) of the Act, 21 U.S.C. section 360bbb-3(b)(1), unless the authorization is terminated or revoked.     Resp Syncytial Virus by PCR NEGATIVE NEGATIVE Final    Comment: (NOTE)  Fact Sheet for Patients: bloggercourse.com  Fact Sheet for Healthcare Providers: seriousbroker.it  This test is not yet approved or cleared by the [place]  FDA and has been authorized for detection and/or diagnosis of SARS-CoV-2 by FDA under an Emergency Use Authorization (EUA). This EUA will remain in effect (meaning this test can be used) for the duration of the COVID-19 declaration under Section 564(b)(1) of the Act, 21 U.S.C. section 360bbb-3(b)(1), unless the authorization is terminated or revoked.  Performed at [facility], [address], [place], [place] [address]      Radiology Studies: CT Angio Chest PE W and/or Wo Contrast Result Date: 08/12/2024 CLINICAL DATA:  Nausea/vomiting/diarrhea, hot flashes and chest pressure with back pain since yesterday EXAM: CT ANGIOGRAPHY CHEST CT ABDOMEN AND PELVIS WITH CONTRAST TECHNIQUE: Multidetector CT imaging of the chest was performed using the standard protocol during bolus administration of intravenous contrast. Multiplanar CT image reconstructions and MIPs were obtained to evaluate the vascular anatomy. Multidetector CT imaging of the abdomen and pelvis was performed using the standard protocol during bolus administration of  intravenous contrast. RADIATION DOSE REDUCTION: This exam was performed according to the departmental dose-optimization program which includes automated exposure control, adjustment of the mA and/or kV according to patient size and/or use of iterative reconstruction technique. CONTRAST:  90mL OMNIPAQUE  IOHEXOL  350 MG/ML SOLN COMPARISON:  08/12/2024, 06/23/2024 FINDINGS: CTA CHEST FINDINGS Cardiovascular: This is a technically adequate evaluation of the pulmonary vasculature. No filling defects or pulmonary emboli. Dilated main pulmonary artery measuring 3.4 cm compatible with pulmonary arterial hypertension. Normal caliber of the thoracic aorta. Assessment of the aortic lumen is limited due to timing of contrast bolus. Stable atherosclerosis throughout the aorta and coronary vasculature. Mediastinum/Nodes: No enlarged mediastinal, hilar, or axillary lymph nodes. Thyroid  gland, trachea, and esophagus demonstrate no significant findings. Lungs/Pleura: No acute airspace disease, effusion, or pneumothorax. The prominent area of fat along the superior extent of the left major fissure, reference image 45/2, is unchanged since prior exams. Central airways are patent. Musculoskeletal: No acute or destructive bony abnormalities. Reconstructed images demonstrate no additional findings. Review of the MIP images confirms the above findings. CT ABDOMEN and PELVIS FINDINGS Hepatobiliary: Large calcified gallstone again noted, with no evidence of acute cholecystitis. The liver is unremarkable. No biliary duct dilation. Pancreas: Unremarkable. No pancreatic ductal dilatation or surrounding inflammatory changes. Spleen: Normal in size without focal abnormality. Adrenals/Urinary Tract: Stable areas of cortical scarring within the upper poles of the kidneys, right greater than left. Otherwise the kidneys enhance normally and symmetrically. No urinary tract calculi or obstructive uropathy. The adrenals and bladder are unremarkable.  Stomach/Bowel: No bowel obstruction or ileus. Normal appendix right lower quadrant. There is marked diverticulosis of the descending and sigmoid colon without evidence of acute diverticulitis. No bowel wall thickening or inflammatory change. Vascular/Lymphatic: Aortic atherosclerosis. No enlarged abdominal or pelvic lymph nodes. Reproductive: Uterus and bilateral adnexa are unremarkable. Other: No free fluid or free intraperitoneal gas. No abdominal wall hernia. Musculoskeletal: No acute or destructive bony abnormalities. Reconstructed images demonstrate no additional findings. Review of the MIP images confirms the above findings. IMPRESSION: Chest: 1. No evidence of pulmonary embolus. 2. No acute intrathoracic process. 3. Dilated main pulmonary artery compatible with pulmonary arterial hypertension. 4. Aortic Atherosclerosis (ICD10-I70.0). Coronary artery atherosclerosis. Abdomen/pelvis: 1. Cholelithiasis without cholecystitis. 2. Distal colonic diverticulosis without diverticulitis. 3.  Aortic Atherosclerosis (ICD10-I70.0). Electronically Signed   By: Ozell Daring M.D.   On: 08/12/2024 15:24   CT ABDOMEN PELVIS W CONTRAST  Result Date: 08/12/2024 CLINICAL DATA:  Nausea/vomiting/diarrhea, hot flashes and chest pressure with back pain since yesterday EXAM: CT ANGIOGRAPHY CHEST CT ABDOMEN AND PELVIS WITH CONTRAST TECHNIQUE: Multidetector CT imaging of the chest was performed using the standard protocol during bolus administration of intravenous contrast. Multiplanar CT image reconstructions and MIPs were obtained to evaluate the vascular anatomy. Multidetector CT imaging of the abdomen and pelvis was performed using the standard protocol during bolus administration of intravenous contrast. RADIATION DOSE REDUCTION: This exam was performed according to the departmental dose-optimization program which includes automated exposure control, adjustment of the mA and/or kV according to patient size and/or use of  iterative reconstruction technique. CONTRAST:  90mL OMNIPAQUE  IOHEXOL  350 MG/ML SOLN COMPARISON:  08/12/2024, 06/23/2024 FINDINGS: CTA CHEST FINDINGS Cardiovascular: This is a technically adequate evaluation of the pulmonary vasculature. No filling defects or pulmonary emboli. Dilated main pulmonary artery measuring 3.4 cm compatible with pulmonary arterial hypertension. Normal caliber of the thoracic aorta. Assessment of the aortic lumen is limited due to timing of contrast bolus. Stable atherosclerosis throughout the aorta and coronary vasculature. Mediastinum/Nodes: No enlarged mediastinal, hilar, or axillary lymph nodes. Thyroid  gland, trachea, and esophagus demonstrate no significant findings. Lungs/Pleura: No acute airspace disease, effusion, or pneumothorax. The prominent area of fat along the superior extent of the left major fissure, reference image 45/2, is unchanged since prior exams. Central airways are patent. Musculoskeletal: No acute or destructive bony abnormalities. Reconstructed images demonstrate no additional findings. Review of the MIP images confirms the above findings. CT ABDOMEN and PELVIS FINDINGS Hepatobiliary: Large calcified gallstone again noted, with no evidence of acute cholecystitis. The liver is unremarkable. No biliary duct dilation. Pancreas: Unremarkable. No pancreatic ductal dilatation or surrounding inflammatory changes. Spleen: Normal in size without focal abnormality. Adrenals/Urinary Tract: Stable areas of cortical scarring within the upper poles of the kidneys, right greater than left. Otherwise the kidneys enhance normally and symmetrically. No urinary tract calculi or obstructive uropathy. The adrenals and bladder are unremarkable. Stomach/Bowel: No bowel obstruction or ileus. Normal appendix right lower quadrant. There is marked diverticulosis of the descending and sigmoid colon without evidence of acute diverticulitis. No bowel wall thickening or inflammatory change.  Vascular/Lymphatic: Aortic atherosclerosis. No enlarged abdominal or pelvic lymph nodes. Reproductive: Uterus and bilateral adnexa are unremarkable. Other: No free fluid or free intraperitoneal gas. No abdominal wall hernia. Musculoskeletal: No acute or destructive bony abnormalities. Reconstructed images demonstrate no additional findings. Review of the MIP images confirms the above findings. IMPRESSION: Chest: 1. No evidence of pulmonary embolus. 2. No acute intrathoracic process. 3. Dilated main pulmonary artery compatible with pulmonary arterial hypertension. 4. Aortic Atherosclerosis (ICD10-I70.0). Coronary artery atherosclerosis. Abdomen/pelvis: 1. Cholelithiasis without cholecystitis. 2. Distal colonic diverticulosis without diverticulitis. 3.  Aortic Atherosclerosis (ICD10-I70.0). Electronically Signed   By: Ozell Daring M.D.   On: 08/12/2024 15:24   DG Chest Port 1 View Result Date: 08/12/2024 EXAM: 1 VIEW(S) XRAY OF THE CHEST 08/12/2024 01:06:12 PM COMPARISON: 02/20/2022 CLINICAL HISTORY: chest pain FINDINGS: LUNGS AND PLEURA: Scarring at left costophrenic angle. No focal pulmonary opacity. No pleural effusion. No pneumothorax. HEART AND MEDIASTINUM: Aortic atherosclerosis. No acute abnormality of the cardiac silhouette. BONES AND SOFT TISSUES: No acute osseous abnormality. IMPRESSION: 1. No acute cardiopulmonary process. 2. Aortic atherosclerosis. 3. Scarring at the left costophrenic angle. Electronically signed by: Waddell Calk MD 08/12/2024 01:53 PM EST RP Workstation: [id number]    Scheduled Meds:  aspirin  EC  81 mg Oral Daily   Continuous Infusions:  sodium chloride  100 mL/hr  at 08/13/24 0420   heparin  1,000 Units/hr (08/13/24 0525)   piperacillin -tazobactam (ZOSYN )  IV Stopped (08/13/24 0306)   vancomycin        LOS: 1 day   Fredia Skeeter, MD [facility]  08/13/2024, 7:59 AM   *Please note that this is a verbal dictation therefore any spelling or grammatical errors are  due to the Dragon Medical One system interpretation.  Please page via Amion and do not message via secure chat for urgent patient care matters. Secure chat can be used for non urgent patient care matters.  How to contact the [facility] Attending or Consulting provider 7A - 7P or covering provider during after hours 7P -7A, for this patient?  Check the care team in [facility] and look for a) attending/consulting [facility] provider listed and b) the [facility] team listed. Page or secure chat 7A-7P. Log into [email or web address] and use [place]'s universal password to access. If you do not have the password, please contact the hospital operator. Locate the [facility] provider you are looking for under [facility] and page to a number that you can be directly reached. If you still have difficulty reaching the provider, please page the [facility] (Director on Call) for the Hospitalists listed on amion for assistance.

## 2024-08-13 NOTE — Progress Notes (Signed)
 I got a call from the lab that pt has a critical Troponin result  = 2530. Also pt HR went up. I informed Dr. Fredia Skeeter and he asked me to reach out to the cardiology team and let them know.

## 2024-08-13 NOTE — Progress Notes (Signed)
 PHARMACY - ANTICOAGULATION CONSULT NOTE  Pharmacy Consult for heparin  Indication: chest pain/ACS  Allergies  Allergen Reactions   Ciprofloxacin Rash   Metronidazole Rash    Patient Measurements: Height: 5' 5 (165.1 cm) Weight: 68.3 kg (150 lb 8 oz) IBW/kg (Calculated) : 57 HEPARIN  DW (KG): 68.3  Vital Signs: Temp: 98.7 F (37.1 C) (11/22 1241) Temp Source: Oral (11/22 1241) BP: 115/62 (11/22 1241) Pulse Rate: 90 (11/22 1241)  Labs: Recent Labs    08/12/24 1209 08/13/24 0226 08/13/24 0656 08/13/24 0657 08/13/24 1228  HGB 14.2 13.6  --   --   --   HCT 42.4 40.8  --   --   --   PLT 304 226  --   --   --   HEPARINUNFRC  --   --   --   --  0.48  CREATININE 0.83 0.76 0.90  --   --   CKTOTAL  --   --  343*  --   --   TROPONINIHS  --   --   --  2,530*  --     Estimated Creatinine Clearance: 47.1 mL/min (by C-G formula based on SCr of 0.9 mg/dL).   Medical History: Past Medical History:  Diagnosis Date   Arthritis    Cholecystolithiasis    Colon polyps    Diverticulosis    Food poisoning    GERD (gastroesophageal reflux disease)    Heart murmur    High blood pressure    Hyperlipidemia    Microscopic colitis    Osteoporosis    PAD (peripheral artery disease) 01/16/2023   Lower extremity arterial Dopplers 02/06/2023: Right-no stenosis, mild-moderate atherosclerosis throughout especially in CFA and calf vessels  ABIs 02/06/2023: Right 0.91, left 1.12   Sjogren's disease    Sleep apnea    wears appliance   Assessment: 51 yoF presented with hot flashes and N/V/D with reported chest pain on 11/20. Previous heparin  consult for ACS, but low suspicion. No AC PTA. Pharmacy re-consulted to dose heparin  for atrial fibrillation.   Heparin  level therapeutic at 0.48. CBC stable: Hgb 13.6, Plt 226. No signs/symptoms of bleeding per RN.   Goal of Therapy:  Heparin  level 0.3-0.7 units/ml Monitor platelets by anticoagulation protocol: Yes   Plan:  Continue heparin   infusion at 1000 units/hr Check anti-Xa level in 8 hours and daily while on heparin  Continue to monitor H&H and platelets  Elma Fail, PharmD PGY1 Clinical Pharmacist [facility]  08/13/2024 1:37 PM

## 2024-08-14 DIAGNOSIS — I214 Non-ST elevation (NSTEMI) myocardial infarction: Secondary | ICD-10-CM

## 2024-08-14 DIAGNOSIS — R079 Chest pain, unspecified: Secondary | ICD-10-CM | POA: Diagnosis not present

## 2024-08-14 LAB — T4, FREE: Free T4: 1.08 ng/dL (ref 0.61–1.12)

## 2024-08-14 LAB — BASIC METABOLIC PANEL WITH GFR
Anion gap: 14 (ref 5–15)
BUN: 13 mg/dL (ref 8–23)
CO2: 24 mmol/L (ref 22–32)
Calcium: 8.4 mg/dL — ABNORMAL LOW (ref 8.9–10.3)
Chloride: 104 mmol/L (ref 98–111)
Creatinine, Ser: 0.9 mg/dL (ref 0.44–1.00)
GFR, Estimated: >60 mL/min (ref >60)
Glucose, Bld: 118 mg/dL — ABNORMAL HIGH (ref 70–99)
Potassium: 3.7 mmol/L (ref 3.5–5.1)
Sodium: 142 mmol/L (ref 135–145)

## 2024-08-14 LAB — CBC WITH DIFFERENTIAL/PLATELET
Abs Immature Granulocytes: 0.04 K/uL (ref 0.00–0.07)
Basophils Absolute: 0.1 K/uL (ref 0.0–0.1)
Basophils Relative: 1 %
Eosinophils Absolute: 0.2 K/uL (ref 0.0–0.5)
Eosinophils Relative: 2 %
HCT: 40.2 % (ref 36.0–46.0)
Hemoglobin: 13.2 g/dL (ref 12.0–15.0)
Immature Granulocytes: 0 %
Lymphocytes Relative: 19 %
Lymphs Abs: 1.9 K/uL (ref 0.7–4.0)
MCH: 32 pg (ref 26.0–34.0)
MCHC: 32.8 g/dL (ref 30.0–36.0)
MCV: 97.6 fL (ref 80.0–100.0)
Monocytes Absolute: 1.1 K/uL — ABNORMAL HIGH (ref 0.1–1.0)
Monocytes Relative: 11 %
Neutro Abs: 6.8 K/uL (ref 1.7–7.7)
Neutrophils Relative %: 67 %
Platelets: 205 K/uL (ref 150–400)
RBC: 4.12 MIL/uL (ref 3.87–5.11)
RDW: 13.1 % (ref 11.5–15.5)
WBC: 10.1 K/uL (ref 4.0–10.5)
nRBC: 0 % (ref 0.0–0.2)

## 2024-08-14 MED ORDER — BUDESONIDE 3 MG PO CPEP
9.0000 mg | ORAL_CAPSULE | Freq: Every day | ORAL | Status: DC
Start: 2024-08-14 — End: 2024-08-16
  Administered 2024-08-14 – 2024-08-16 (×3): 9 mg via ORAL
  Filled 2024-08-14 (×3): qty 3

## 2024-08-14 MED ORDER — VARENICLINE TARTRATE 0.03 MG/ACT NA SOLN: 1.0000 [drp] | Freq: Two times a day (BID) | NASAL | Status: DC

## 2024-08-14 MED ORDER — ASPIRIN 81 MG PO CHEW
81.0000 mg | CHEWABLE_TABLET | ORAL | Status: AC
  Administered 2024-08-15: 81 mg via ORAL
  Filled 2024-08-14: qty 1

## 2024-08-14 MED ORDER — CYCLOSPORINE 0.05 % OP EMUL
1.0000 [drp] | Freq: Two times a day (BID) | OPHTHALMIC | Status: DC
  Administered 2024-08-14 – 2024-08-16 (×4): 1 [drp] via OPHTHALMIC
  Filled 2024-08-14 (×6): qty 30

## 2024-08-14 MED ORDER — VENLAFAXINE HCL ER 37.5 MG PO CP24
37.5000 mg | ORAL_CAPSULE | Freq: Every day | ORAL | Status: DC
  Administered 2024-08-16: 37.5 mg via ORAL
  Filled 2024-08-14 (×3): qty 1

## 2024-08-14 MED ORDER — FREE WATER: 500.0000 mL | Freq: Once | Status: DC

## 2024-08-14 NOTE — Progress Notes (Signed)
  Progress Note  Patient Name: Loretta Austin Date of Encounter: 08/14/2024 [facility] Cardiologist: Aleene Passe, MD (Inactive)   Interval Summary   Hot flashes have resolved in last 24 hours. We also noted no arrhythmia in last 24 hours. Suspect correlation.   Vital Signs Vitals:   08/14/24 0500 08/14/24 0600 08/14/24 0803 08/14/24 0957  BP:   132/63 132/63  Pulse: 65 68  92  Resp: 18 (!) 23    Temp:   (!) 97.5 F (36.4 C)   TempSrc:   Oral   SpO2: 97% 95%    Weight:      Height:        Intake/Output Summary (Last 24 hours) at 08/14/2024 1051 Last data filed at 08/14/2024 0806 Gross per 24 hour  Intake 1999 ml  Output --  Net 1999 ml      08/12/2024   10:28 PM 08/12/2024   12:06 PM 07/19/2024    2:44 PM  Last 3 Weights  Weight (lbs) 150 lb 8 oz 149 lb 154 lb 1.6 oz  Weight (kg) 68.266 kg 67.586 kg 69.899 kg      Telemetry/ECG  SR first deg avb - Personally Reviewed  Physical Exam  GEN: No acute distress.   Neck: No JVD Cardiac: RRR, no murmurs, rubs, or gallops.  Respiratory: Clear to auscultation bilaterally. GI: Soft, nontender, non-distended  MS: No edema  Assessment & Plan   NSTEMI, likely type I -Continue IV heparin  and ASA 81 mg daily -Plan for cardiac catheterization Monday, patient has been consented and agrees to proceed. -Echocardiogram demonstrates regional wall motion abnormalities and mildly reduced ejection fraction, concern for ischemic origin of reduced EF Informed Consent   Shared Decision Making/Informed Consent The risks [stroke (1 in 1000), death (1 in 1000), kidney failure [usually temporary] (1 in 500), bleeding (1 in 200), allergic reaction [possibly serious] (1 in 200)], benefits (diagnostic support and management of coronary artery disease) and alternatives of a cardiac catheterization were discussed in detail with Loretta Austin and she is willing to proceed.      Heart failure with mildly reduced EF Likely ischemic  cardiomyopathy -Further recommendations pending cardiac catheterization, will consolidate BB and titrate meds.   Paroxysmal atrial flutter -Continue on IV heparin , will consider anticoagulation options after cath -Currently in sinus rhythm -Patient's arrhythmia does seem to correlate with some symptoms of hot flashes and diaphoresis. Hopefully after extensive workup, this is the etiology.       For questions or updates, please contact [facility] Please consult [email or web address] for contact info under         Signed, Giovoni Bunch A Ahmon Tosi, MD

## 2024-08-14 NOTE — Plan of Care (Signed)

## 2024-08-14 NOTE — H&P (View-Only) (Signed)
  Progress Note  Patient Name: Loretta Austin Date of Encounter: 08/14/2024 [facility] Cardiologist: Aleene Passe, MD (Inactive)   Interval Summary   Hot flashes have resolved in last 24 hours. We also noted no arrhythmia in last 24 hours. Suspect correlation.   Vital Signs Vitals:   08/14/24 0500 08/14/24 0600 08/14/24 0803 08/14/24 0957  BP:   132/63 132/63  Pulse: 65 68  92  Resp: 18 (!) 23    Temp:   (!) 97.5 F (36.4 C)   TempSrc:   Oral   SpO2: 97% 95%    Weight:      Height:        Intake/Output Summary (Last 24 hours) at 08/14/2024 1051 Last data filed at 08/14/2024 0806 Gross per 24 hour  Intake 1999 ml  Output --  Net 1999 ml      08/12/2024   10:28 PM 08/12/2024   12:06 PM 07/19/2024    2:44 PM  Last 3 Weights  Weight (lbs) 150 lb 8 oz 149 lb 154 lb 1.6 oz  Weight (kg) 68.266 kg 67.586 kg 69.899 kg      Telemetry/ECG  SR first deg avb - Personally Reviewed  Physical Exam  GEN: No acute distress.   Neck: No JVD Cardiac: RRR, no murmurs, rubs, or gallops.  Respiratory: Clear to auscultation bilaterally. GI: Soft, nontender, non-distended  MS: No edema  Assessment & Plan   NSTEMI, likely type I -Continue IV heparin  and ASA 81 mg daily -Plan for cardiac catheterization Monday, patient has been consented and agrees to proceed. -Echocardiogram demonstrates regional wall motion abnormalities and mildly reduced ejection fraction, concern for ischemic origin of reduced EF Informed Consent   Shared Decision Making/Informed Consent The risks [stroke (1 in 1000), death (1 in 1000), kidney failure [usually temporary] (1 in 500), bleeding (1 in 200), allergic reaction [possibly serious] (1 in 200)], benefits (diagnostic support and management of coronary artery disease) and alternatives of a cardiac catheterization were discussed in detail with Loretta Austin and she is willing to proceed.      Heart failure with mildly reduced EF Likely ischemic  cardiomyopathy -Further recommendations pending cardiac catheterization, will consolidate BB and titrate meds.   Paroxysmal atrial flutter -Continue on IV heparin , will consider anticoagulation options after cath -Currently in sinus rhythm -Patient's arrhythmia does seem to correlate with some symptoms of hot flashes and diaphoresis. Hopefully after extensive workup, this is the etiology.       For questions or updates, please contact [facility] Please consult [email or web address] for contact info under         Signed, Giovoni Bunch A Ahmon Tosi, MD

## 2024-08-14 NOTE — Progress Notes (Signed)
 PHARMACY - ANTICOAGULATION CONSULT NOTE  Pharmacy Consult for heparin  Indication: chest pain/ACS  Allergies  Allergen Reactions   Ciprofloxacin Rash   Metronidazole Rash    Patient Measurements: Height: 5' 5 (165.1 cm) Weight: 68.3 kg (150 lb 8 oz) IBW/kg (Calculated) : 57 HEPARIN  DW (KG): 68.3  Vital Signs: Temp: 98.6 F (37 C) (11/23 0440) Temp Source: Oral (11/23 0440) BP: 127/75 (11/23 0440) Pulse Rate: 68 (11/23 0600)  Labs: Recent Labs    08/12/24 1209 08/13/24 0226 08/13/24 0656 08/13/24 0657 08/13/24 1228 08/13/24 2038  HGB 14.2 13.6  --   --   --   --   HCT 42.4 40.8  --   --   --   --   PLT 304 226  --   --   --   --   HEPARINUNFRC  --   --   --   --  0.48 0.43  CREATININE 0.83 0.76 0.90  --   --   --   CKTOTAL  --   --  343*  --   --   --   TROPONINIHS  --   --   --  2,530*  --   --     Estimated Creatinine Clearance: 47.1 mL/min (by C-G formula based on SCr of 0.9 mg/dL).   Medical History: Past Medical History:  Diagnosis Date   Arthritis    Cholecystolithiasis    Colon polyps    Diverticulosis    Food poisoning    GERD (gastroesophageal reflux disease)    Heart murmur    High blood pressure    Hyperlipidemia    Microscopic colitis    Osteoporosis    PAD (peripheral artery disease) 01/16/2023   Lower extremity arterial Dopplers 02/06/2023: Right-no stenosis, mild-moderate atherosclerosis throughout especially in CFA and calf vessels  ABIs 02/06/2023: Right 0.91, left 1.12   Sjogren's disease    Sleep apnea    wears appliance   Assessment: 89 yoF presented with hot flashes and N/V/D with reported chest pain on 11/20. Previous heparin  consult for ACS, but low suspicion. No AC PTA. Pharmacy re-consulted to dose heparin  for atrial fibrillation.   Heparin  level 0.43 is therapeutic on 1000 units/hr. CBC stable. No issues with the infusion or bleeding reported.  Goal of Therapy:  Heparin  level 0.3-0.7 units/ml Monitor platelets by  anticoagulation protocol: Yes   Plan:  Continue heparin  infusion at 1000 units/hr Check anti-Xa level daily while on heparin  Continue to monitor H&H and platelets  Elma Fail, PharmD PGY1 Clinical Pharmacist [facility]  08/14/2024 7:09 AM

## 2024-08-14 NOTE — Progress Notes (Signed)
 PROGRESS NOTE    Loretta Austin  FMW:[id number] DOB: [birth year] DOA: 08/12/2024 PCP: Yolande Toribio MATSU, MD   Brief Narrative:  HPI: Loretta Austin is a 78 y.o. female with medical history significant of hypertension, CAD, PAD, obstructive sleep apnea, collagenous colitis, rheumatoid arthritis and hyperlipidemia who presents to the hospital with complaint of chest pain. The patient states that the night prior that she had eaten something earlier in the night before and then 3 hours later was having nausea and vomiting. She then finally got to sleep and stated that she woke up with hot flashes. She states that they are so severe when they come on at night and she had associated chest pain at that time. She states that it was a pressure like sensation and she was diaphoretic. She denied any shortness of breath at that time.    She states that she has been having these hot flash symptoms for the past 2-3 months and that she has also had associated weight loss of 14 pounds. She states that she doesn't have much of an appetite. Appears that she was seeing oncology with the same complaint and she was started on Estroven to help.  They evaluated her unintentional weight loss as well and state that all evaluations have been negative thus far.   ED Course:  In the ER, B114/63P, HR 78, RR 15, O2 saturation 98% on RA,  and Tmax 98.5. Cbc demonstrated wbc21.6,  hb/hct 14.2/42.4, and platelet 304. Chemistry demonstrated Na 138, K 3.6, Cl 95, bicarb 23, Bun/Cr 24/0.83 and glucose 172.  Calcium  was 10.4.  anion gap 20. Initial troponin was 365 and repeat was 676.  CXR demonstrated no acute cardiopulmonary findings. Urinalysis was negative for acute infection. EKG initial was sinus bradycardia. Respiratory PCR was negative. CTA chest was negative for PE.  Assessment & Plan:   Principal Problem:   Chest pain  Chest pain/elevated troponin/?  NSTEMI: Presented with chest pain and heart flashes.  Per initial  conversation between ED physician and cardiology/Dr. Pietro, cardiology did not believe that patient had true ACS event and suspected troponin leak due to demand ischemia and did not recommend heparin  drip.  However patient's troponins continue to rise 365> 676> 2530 and the fact that patient overnight-08/12/2024 developed atrial flutter, she was eventually started on heparin  drip by hospitalist.  Echo showed reduced 40 to 45% ejection fraction and grade 1 diastolic dysfunction along with wall motion abnormality consistent with NSTEMI.  Cardiology on board, plan for cardiac cath tomorrow.  New onset atrial flutter: Developed overnight 08/12/2024.  Started on heparin  drip.  Received diltiazem .  Converted to sinus rhythm on the morning of 08/13/2024.  Started on metoprolol  12.5 p.o. twice daily.  Hypertension: PTA on irbesartan which is currently on hold, blood pressure controlled.  Hyperlipidemia: Continue statin.  Rheumatoid arthritis: Patient on Humira.  Collagenous colitis: Followed by GI, started on budesonide  daily.  DVT prophylaxis: Heparin    Code Status: Full Code  Family Communication: Daughter present at bedside.  Plan of care discussed with patient in length and he/she verbalized understanding and agreed with it.  Status is: Inpatient Remains inpatient appropriate because: Plan of cardiac cath tomorrow.   Estimated body mass index is 25.04 kg/m as calculated from the following:   Height as of this encounter: 5' 5 (1.651 m).   Weight as of this encounter: 68.3 kg.    Nutritional Assessment: Body mass index is 25.04 kg/m.SABRA Seen by dietician.  I agree with the assessment and  plan as outlined below: Nutrition Status:        . Skin Assessment: I have examined the patient's skin and I agree with the wound assessment as performed by the wound care RN as outlined below:    Consultants:  Cardiology  Procedures:  As above  Antimicrobials:  Anti-infectives (From  admission, onward)    Start     Dose/Rate Route Frequency Ordered Stop   08/14/24 0100  vancomycin  (VANCOCIN ) IVPB 1000 mg/200 mL premix        1,000 mg 200 mL/hr over 60 Minutes Intravenous Every 24 hours 08/13/24 0834     08/13/24 2200  vancomycin  (VANCOCIN ) IVPB 1000 mg/200 mL premix  Status:  Discontinued        1,000 mg 200 mL/hr over 60 Minutes Intravenous Every 24 hours 08/13/24 0024 08/13/24 0834   08/13/24 0200  piperacillin -tazobactam (ZOSYN ) IVPB 3.375 g        3.375 g 12.5 mL/hr over 240 Minutes Intravenous Every 8 hours 08/13/24 0004     08/13/24 0115  vancomycin  (VANCOREADY) IVPB 1250 mg/250 mL        1,250 mg 166.7 mL/hr over 90 Minutes Intravenous  Once 08/13/24 0022 08/13/24 0224   08/12/24 1700  cefTRIAXone  (ROCEPHIN ) 1 g in sodium chloride  0.9 % 100 mL IVPB        1 g 200 mL/hr over 30 Minutes Intravenous  Once 08/12/24 1655 08/12/24 1830         Subjective: Seen and examined.  She has no complaints.  However says that at times she feels palpitation but that likely is brief.  Objective: Vitals:   08/14/24 0400 08/14/24 0440 08/14/24 0500 08/14/24 0600  BP:  127/75    Pulse: 64 78 65 68  Resp: 19 16 18  (!) 23  Temp:  98.6 F (37 C)    TempSrc:  Oral    SpO2: 96% 99% 97% 95%  Weight:      Height:        Intake/Output Summary (Last 24 hours) at 08/14/2024 0800 Last data filed at 08/14/2024 0320 Gross per 24 hour  Intake 1879 ml  Output --  Net 1879 ml   Filed Weights   08/12/24 1206 08/12/24 2228  Weight: 67.6 kg 68.3 kg    Examination:  General exam: Appears calm and comfortable  Respiratory system: Clear to auscultation. Respiratory effort normal. Cardiovascular system: S1 & S2 heard, RRR. No JVD, murmurs, rubs, gallops or clicks. No pedal edema. Gastrointestinal system: Abdomen is nondistended, soft and nontender. No organomegaly or masses felt. Normal bowel sounds heard. Central nervous system: Alert and oriented. No focal neurological  deficits. Extremities: Symmetric 5 x 5 power. Skin: No rashes, lesions or ulcers.  Psychiatry: Judgement and insight appear normal. Mood & affect appropriate.    Data Reviewed: I have personally reviewed following labs and imaging studies  CBC: Recent Labs  Lab 08/12/24 1209 08/13/24 0226  WBC 21.6* 16.7*  HGB 14.2 13.6  HCT 42.4 40.8  MCV 97.7 95.8  PLT 304 226   Basic Metabolic Panel: Recent Labs  Lab 08/12/24 1209 08/13/24 0226 08/13/24 0656  NA 138  --  136  K 3.6  --  3.7  CL 95*  --  102  CO2 23  --  20*  GLUCOSE 172*  --  120*  BUN 24*  --  11  CREATININE 0.83 0.76 0.90  CALCIUM  10.4*  --  8.6*  MG  --   --  1.9  GFR: Estimated Creatinine Clearance: 47.1 mL/min (by C-G formula based on SCr of 0.9 mg/dL). Liver Function Tests: Recent Labs  Lab 08/12/24 1332  AST 33  ALT 10  ALKPHOS 58  BILITOT 0.8  PROT 7.5  ALBUMIN 4.7   Recent Labs  Lab 08/12/24 1332  LIPASE 22   No results for input(s): AMMONIA in the last 168 hours. Coagulation Profile: No results for input(s): INR, PROTIME in the last 168 hours. Cardiac Enzymes: Recent Labs  Lab 08/13/24 0656  CKTOTAL 343*   BNP (last 3 results) No results for input(s): PROBNP in the last 8760 hours. HbA1C: No results for input(s): HGBA1C in the last 72 hours. CBG: No results for input(s): GLUCAP in the last 168 hours. Lipid Profile: No results for input(s): CHOL, HDL, LDLCALC, TRIG, CHOLHDL, LDLDIRECT in the last 72 hours. Thyroid  Function Tests: Recent Labs    08/13/24 1041  TSH 5.146*   Anemia Panel: No results for input(s): VITAMINB12, FOLATE, FERRITIN, TIBC, IRON, RETICCTPCT in the last 72 hours. Sepsis Labs: No results for input(s): PROCALCITON, LATICACIDVEN in the last 168 hours.  Recent Results (from the past 240 hours)  Resp panel by RT-PCR (RSV, Flu A&B, Covid)     Status: None   Collection Time: 08/12/24  5:13 PM   Specimen: Nasal Swab   Result Value Ref Range Status   SARS Coronavirus 2 by RT PCR NEGATIVE NEGATIVE Final    Comment: (NOTE) SARS-CoV-2 target nucleic acids are NOT DETECTED.  The SARS-CoV-2 RNA is generally detectable in upper respiratory specimens during the acute phase of infection. The lowest concentration of SARS-CoV-2 viral copies this assay can detect is 138 copies/mL. A negative result does not preclude SARS-Cov-2 infection and should not be used as the sole basis for treatment or other patient management decisions. A negative result may occur with  improper specimen collection/handling, submission of specimen other than nasopharyngeal swab, presence of viral mutation(s) within the areas targeted by this assay, and inadequate number of viral copies(<138 copies/mL). A negative result must be combined with clinical observations, patient history, and epidemiological information. The expected result is Negative.  Fact Sheet for Patients:  bloggercourse.com  Fact Sheet for Healthcare Providers:  seriousbroker.it  This test is no t yet approved or cleared by the [place]  FDA and  has been authorized for detection and/or diagnosis of SARS-CoV-2 by FDA under an Emergency Use Authorization (EUA). This EUA will remain  in effect (meaning this test can be used) for the duration of the COVID-19 declaration under Section 564(b)(1) of the Act, 21 U.S.C.section 360bbb-3(b)(1), unless the authorization is terminated  or revoked sooner.       Influenza A by PCR NEGATIVE NEGATIVE Final   Influenza B by PCR NEGATIVE NEGATIVE Final    Comment: (NOTE) The Xpert Xpress SARS-CoV-2/FLU/RSV plus assay is intended as an aid in the diagnosis of influenza from Nasopharyngeal swab specimens and should not be used as a sole basis for treatment. Nasal washings and aspirates are unacceptable for Xpert Xpress SARS-CoV-2/FLU/RSV testing.  Fact Sheet for  Patients: bloggercourse.com  Fact Sheet for Healthcare Providers: seriousbroker.it  This test is not yet approved or cleared by the [place]  FDA and has been authorized for detection and/or diagnosis of SARS-CoV-2 by FDA under an Emergency Use Authorization (EUA). This EUA will remain in effect (meaning this test can be used) for the duration of the COVID-19 declaration under Section 564(b)(1) of the Act, 21 U.S.C. section 360bbb-3(b)(1), unless the authorization is terminated  or revoked.     Resp Syncytial Virus by PCR NEGATIVE NEGATIVE Final    Comment: (NOTE) Fact Sheet for Patients: bloggercourse.com  Fact Sheet for Healthcare Providers: seriousbroker.it  This test is not yet approved or cleared by the [place]  FDA and has been authorized for detection and/or diagnosis of SARS-CoV-2 by FDA under an Emergency Use Authorization (EUA). This EUA will remain in effect (meaning this test can be used) for the duration of the COVID-19 declaration under Section 564(b)(1) of the Act, 21 U.S.C. section 360bbb-3(b)(1), unless the authorization is terminated or revoked.  Performed at [facility], [address], [place], [place] [address]   Culture, blood (routine x 2)     Status: None (Preliminary result)   Collection Time: 08/12/24  5:30 PM   Specimen: BLOOD  Result Value Ref Range Status   Specimen Description   Final    BLOOD BLOOD RIGHT WRIST Performed at [facility], [address], [place], [place] [address]    Special Requests   Final    Blood Culture adequate volume Performed at [facility], [address], [place], [place] [address]    Culture   Final    NO GROWTH < 12 HOURS Performed at [facility], [address], [place], [place] [address]    Report Status PENDING  Incomplete  Culture,  blood (routine x 2)     Status: None (Preliminary result)   Collection Time: 08/12/24  5:45 PM   Specimen: BLOOD  Result Value Ref Range Status   Specimen Description   Final    BLOOD LEFT ANTECUBITAL Performed at [facility], [address] [place], [place] [address]    Special Requests   Final    Blood Culture adequate volume Performed at [facility], [address] [place], [place] [address]    Culture   Final    NO GROWTH < 12 HOURS Performed at [facility], [address], [place], [place] [address]    Report Status PENDING  Incomplete     Radiology Studies: ECHOCARDIOGRAM COMPLETE Result Date: 08/13/2024    ECHOCARDIOGRAM REPORT   Patient Name:   Tyshia Fenter Date of Exam: 08/13/2024 Medical Rec #:  [id number]      Height:       65.0 in Accession #:    [id number]     Weight:       150.5 lb Date of Birth:  [birth year]      BSA:          1.753 m Patient Age:    78 years       BP:           113/90 mmHg Patient Gender: F              HR:           91 bpm. Exam Location:  Inpatient Procedure: 2D Echo, Cardiac Doppler and Color Doppler (Both Spectral and Color            Flow Doppler were utilized during procedure). Indications:    Chest Pain R07.9  History:        Patient has no prior history of Echocardiogram examinations.                 CAD, PAD, Signs/Symptoms:Chest Pain; Risk Factors:Hypertension,                 Sleep Apnea and Dyslipidemia.  Sonographer:  Thea Norlander RCS Referring Phys: BRADLY MARLA DRONES IMPRESSIONS  1. Akinesis of the distal septum and hypokinesis of the apex with overall mild to moderate LV dysfunction.  2. Left ventricular ejection fraction, by estimation, is 40 to 45%. The left ventricle has mildly decreased function. The left ventricle demonstrates regional wall motion abnormalities (see scoring diagram/findings for description). There is mild left ventricular hypertrophy. Left ventricular diastolic  parameters are consistent with Grade I diastolic dysfunction (impaired relaxation).  3. Right ventricular systolic function is normal. The right ventricular size is normal.  4. The mitral valve is normal in structure. No evidence of mitral valve regurgitation. No evidence of mitral stenosis.  5. The aortic valve is tricuspid. Aortic valve regurgitation is not visualized. No aortic stenosis is present.  6. The inferior vena cava is normal in size with greater than 50% respiratory variability, suggesting right atrial pressure of 3 mmHg. Comparison(s): No prior Echocardiogram. FINDINGS  Left Ventricle: Left ventricular ejection fraction, by estimation, is 40 to 45%. The left ventricle has mildly decreased function. The left ventricle demonstrates regional wall motion abnormalities. The left ventricular internal cavity size was normal in size. There is mild left ventricular hypertrophy. Left ventricular diastolic parameters are consistent with Grade I diastolic dysfunction (impaired relaxation). Right Ventricle: The right ventricular size is normal. Right ventricular systolic function is normal. Left Atrium: Left atrial size was normal in size. Right Atrium: Right atrial size was normal in size. Pericardium: Trivial pericardial effusion is present. Mitral Valve: The mitral valve is normal in structure. Mild mitral annular calcification. No evidence of mitral valve regurgitation. No evidence of mitral valve stenosis. Tricuspid Valve: The tricuspid valve is normal in structure. Tricuspid valve regurgitation is trivial. No evidence of tricuspid stenosis. Aortic Valve: The aortic valve is tricuspid. Aortic valve regurgitation is not visualized. No aortic stenosis is present. Aortic valve peak gradient measures 7.4 mmHg. Pulmonic Valve: The pulmonic valve was not well visualized. Pulmonic valve regurgitation is trivial. No evidence of pulmonic stenosis. Aorta: The aortic root is normal in size and structure. Venous: The  inferior vena cava is normal in size with greater than 50% respiratory variability, suggesting right atrial pressure of 3 mmHg. IAS/Shunts: No atrial level shunt detected by color flow Doppler. Additional Comments: Akinesis of the distal septum and hypokinesis of the apex with overall mild to moderate LV dysfunction.  LEFT VENTRICLE PLAX 2D LVIDd:         4.80 cm   Diastology LVIDs:         3.40 cm   LV e' medial:    5.00 cm/s LV PW:         1.20 cm   LV E/e' medial:  11.7 LV IVS:        1.00 cm   LV e' lateral:   7.07 cm/s LVOT diam:     2.00 cm   LV E/e' lateral: 8.2 LV SV:         40 LV SV Index:   23 LVOT Area:     3.14 cm  RIGHT VENTRICLE            IVC RV S prime:     8.59 cm/s  IVC diam: 1.50 cm TAPSE (M-mode): 1.9 cm LEFT ATRIUM             Index        RIGHT ATRIUM           Index LA diam:  3.80 cm 2.17 cm/m   RA Area:     10.90 cm LA Vol (A2C):   36.6 ml 20.88 ml/m  RA Volume:   21.10 ml  12.04 ml/m LA Vol (A4C):   30.4 ml 17.34 ml/m LA Biplane Vol: 33.7 ml 19.22 ml/m  AORTIC VALVE AV Area (Vmax): 1.73 cm AV Vmax:        136.00 cm/s AV Peak Grad:   7.4 mmHg LVOT Vmax:      74.70 cm/s LVOT Vmean:     49.700 cm/s LVOT VTI:       0.128 m  AORTA Ao Root diam: 3.30 cm Ao Asc diam:  3.50 cm MITRAL VALVE               TRICUSPID VALVE MV Area (PHT): 4.10 cm    TR Peak grad:   6.6 mmHg MV Decel Time: 185 msec    TR Vmax:        128.00 cm/s MV E velocity: 58.30 cm/s MV A velocity: 74.70 cm/s  SHUNTS MV E/A ratio:  0.78        Systemic VTI:  0.13 m                            Systemic Diam: 2.00 cm Redell Shallow MD Electronically signed by Redell Shallow MD Signature Date/Time: 08/13/2024/11:40:34 AM    Final    CT Angio Chest PE W and/or Wo Contrast Result Date: 08/12/2024 CLINICAL DATA:  Nausea/vomiting/diarrhea, hot flashes and chest pressure with back pain since yesterday EXAM: CT ANGIOGRAPHY CHEST CT ABDOMEN AND PELVIS WITH CONTRAST TECHNIQUE: Multidetector CT imaging of the chest was  performed using the standard protocol during bolus administration of intravenous contrast. Multiplanar CT image reconstructions and MIPs were obtained to evaluate the vascular anatomy. Multidetector CT imaging of the abdomen and pelvis was performed using the standard protocol during bolus administration of intravenous contrast. RADIATION DOSE REDUCTION: This exam was performed according to the departmental dose-optimization program which includes automated exposure control, adjustment of the mA and/or kV according to patient size and/or use of iterative reconstruction technique. CONTRAST:  90mL OMNIPAQUE  IOHEXOL  350 MG/ML SOLN COMPARISON:  08/12/2024, 06/23/2024 FINDINGS: CTA CHEST FINDINGS Cardiovascular: This is a technically adequate evaluation of the pulmonary vasculature. No filling defects or pulmonary emboli. Dilated main pulmonary artery measuring 3.4 cm compatible with pulmonary arterial hypertension. Normal caliber of the thoracic aorta. Assessment of the aortic lumen is limited due to timing of contrast bolus. Stable atherosclerosis throughout the aorta and coronary vasculature. Mediastinum/Nodes: No enlarged mediastinal, hilar, or axillary lymph nodes. Thyroid  gland, trachea, and esophagus demonstrate no significant findings. Lungs/Pleura: No acute airspace disease, effusion, or pneumothorax. The prominent area of fat along the superior extent of the left major fissure, reference image 45/2, is unchanged since prior exams. Central airways are patent. Musculoskeletal: No acute or destructive bony abnormalities. Reconstructed images demonstrate no additional findings. Review of the MIP images confirms the above findings. CT ABDOMEN and PELVIS FINDINGS Hepatobiliary: Large calcified gallstone again noted, with no evidence of acute cholecystitis. The liver is unremarkable. No biliary duct dilation. Pancreas: Unremarkable. No pancreatic ductal dilatation or surrounding inflammatory changes. Spleen: Normal in  size without focal abnormality. Adrenals/Urinary Tract: Stable areas of cortical scarring within the upper poles of the kidneys, right greater than left. Otherwise the kidneys enhance normally and symmetrically. No urinary tract calculi or obstructive uropathy. The adrenals and bladder are unremarkable. Stomach/Bowel: No bowel obstruction  or ileus. Normal appendix right lower quadrant. There is marked diverticulosis of the descending and sigmoid colon without evidence of acute diverticulitis. No bowel wall thickening or inflammatory change. Vascular/Lymphatic: Aortic atherosclerosis. No enlarged abdominal or pelvic lymph nodes. Reproductive: Uterus and bilateral adnexa are unremarkable. Other: No free fluid or free intraperitoneal gas. No abdominal wall hernia. Musculoskeletal: No acute or destructive bony abnormalities. Reconstructed images demonstrate no additional findings. Review of the MIP images confirms the above findings. IMPRESSION: Chest: 1. No evidence of pulmonary embolus. 2. No acute intrathoracic process. 3. Dilated main pulmonary artery compatible with pulmonary arterial hypertension. 4. Aortic Atherosclerosis (ICD10-I70.0). Coronary artery atherosclerosis. Abdomen/pelvis: 1. Cholelithiasis without cholecystitis. 2. Distal colonic diverticulosis without diverticulitis. 3.  Aortic Atherosclerosis (ICD10-I70.0). Electronically Signed   By: Ozell Daring M.D.   On: 08/12/2024 15:24   CT ABDOMEN PELVIS W CONTRAST Result Date: 08/12/2024 CLINICAL DATA:  Nausea/vomiting/diarrhea, hot flashes and chest pressure with back pain since yesterday EXAM: CT ANGIOGRAPHY CHEST CT ABDOMEN AND PELVIS WITH CONTRAST TECHNIQUE: Multidetector CT imaging of the chest was performed using the standard protocol during bolus administration of intravenous contrast. Multiplanar CT image reconstructions and MIPs were obtained to evaluate the vascular anatomy. Multidetector CT imaging of the abdomen and pelvis was performed  using the standard protocol during bolus administration of intravenous contrast. RADIATION DOSE REDUCTION: This exam was performed according to the departmental dose-optimization program which includes automated exposure control, adjustment of the mA and/or kV according to patient size and/or use of iterative reconstruction technique. CONTRAST:  90mL OMNIPAQUE  IOHEXOL  350 MG/ML SOLN COMPARISON:  08/12/2024, 06/23/2024 FINDINGS: CTA CHEST FINDINGS Cardiovascular: This is a technically adequate evaluation of the pulmonary vasculature. No filling defects or pulmonary emboli. Dilated main pulmonary artery measuring 3.4 cm compatible with pulmonary arterial hypertension. Normal caliber of the thoracic aorta. Assessment of the aortic lumen is limited due to timing of contrast bolus. Stable atherosclerosis throughout the aorta and coronary vasculature. Mediastinum/Nodes: No enlarged mediastinal, hilar, or axillary lymph nodes. Thyroid  gland, trachea, and esophagus demonstrate no significant findings. Lungs/Pleura: No acute airspace disease, effusion, or pneumothorax. The prominent area of fat along the superior extent of the left major fissure, reference image 45/2, is unchanged since prior exams. Central airways are patent. Musculoskeletal: No acute or destructive bony abnormalities. Reconstructed images demonstrate no additional findings. Review of the MIP images confirms the above findings. CT ABDOMEN and PELVIS FINDINGS Hepatobiliary: Large calcified gallstone again noted, with no evidence of acute cholecystitis. The liver is unremarkable. No biliary duct dilation. Pancreas: Unremarkable. No pancreatic ductal dilatation or surrounding inflammatory changes. Spleen: Normal in size without focal abnormality. Adrenals/Urinary Tract: Stable areas of cortical scarring within the upper poles of the kidneys, right greater than left. Otherwise the kidneys enhance normally and symmetrically. No urinary tract calculi or  obstructive uropathy. The adrenals and bladder are unremarkable. Stomach/Bowel: No bowel obstruction or ileus. Normal appendix right lower quadrant. There is marked diverticulosis of the descending and sigmoid colon without evidence of acute diverticulitis. No bowel wall thickening or inflammatory change. Vascular/Lymphatic: Aortic atherosclerosis. No enlarged abdominal or pelvic lymph nodes. Reproductive: Uterus and bilateral adnexa are unremarkable. Other: No free fluid or free intraperitoneal gas. No abdominal wall hernia. Musculoskeletal: No acute or destructive bony abnormalities. Reconstructed images demonstrate no additional findings. Review of the MIP images confirms the above findings. IMPRESSION: Chest: 1. No evidence of pulmonary embolus. 2. No acute intrathoracic process. 3. Dilated main pulmonary artery compatible with pulmonary arterial hypertension. 4. Aortic Atherosclerosis (ICD10-I70.0). Coronary  artery atherosclerosis. Abdomen/pelvis: 1. Cholelithiasis without cholecystitis. 2. Distal colonic diverticulosis without diverticulitis. 3.  Aortic Atherosclerosis (ICD10-I70.0). Electronically Signed   By: Ozell Daring M.D.   On: 08/12/2024 15:24   DG Chest Port 1 View Result Date: 08/12/2024 EXAM: 1 VIEW(S) XRAY OF THE CHEST 08/12/2024 01:06:12 PM COMPARISON: 02/20/2022 CLINICAL HISTORY: chest pain FINDINGS: LUNGS AND PLEURA: Scarring at left costophrenic angle. No focal pulmonary opacity. No pleural effusion. No pneumothorax. HEART AND MEDIASTINUM: Aortic atherosclerosis. No acute abnormality of the cardiac silhouette. BONES AND SOFT TISSUES: No acute osseous abnormality. IMPRESSION: 1. No acute cardiopulmonary process. 2. Aortic atherosclerosis. 3. Scarring at the left costophrenic angle. Electronically signed by: Waddell Calk MD 08/12/2024 01:53 PM EST RP Workstation: [id number]    Scheduled Meds:  aspirin  EC  81 mg Oral Daily   budesonide   9 mg Oral Daily   cycloSPORINE   1 drop Both Eyes  BID   metoprolol  tartrate  12.5 mg Oral BID   Varenicline  Tartrate  1 drop Both Eyes BID   venlafaxine  XR  37.5 mg Oral Q breakfast   Continuous Infusions:  heparin  1,000 Units/hr (08/14/24 0320)   piperacillin -tazobactam (ZOSYN )  IV 12.5 mL/hr at 08/14/24 0320   vancomycin  Stopped (08/14/24 0230)     LOS: 2 days   Fredia Skeeter, MD [facility]  08/14/2024, 8:00 AM   *Please note that this is a verbal dictation therefore any spelling or grammatical errors are due to the Dragon Medical One system interpretation.  Please page via Amion and do not message via secure chat for urgent patient care matters. Secure chat can be used for non urgent patient care matters.  How to contact the [facility] Attending or Consulting provider 7A - 7P or covering provider during after hours 7P -7A, for this patient?  Check the care team in [facility] and look for a) attending/consulting TRH provider listed and b) the TR[facility] team listed. Page or secure chat 7A-7P. Log into [email or web address] and use [place]'s universal password to access. If you do not have the password, please contact the hospital operator. Locate the [facility] provider you are looking for under [facility] and page to a number that you can be directly reached. If you still have difficulty reaching the provider, please page the [facility] (Director on Call) for the Hospitalists listed on amion for assistance.

## 2024-08-15 ENCOUNTER — Encounter (HOSPITAL_COMMUNITY): Admission: EM | Disposition: A | Payer: Self-pay | Source: Home / Self Care

## 2024-08-15 ENCOUNTER — Encounter (HOSPITAL_COMMUNITY): Payer: Self-pay | Admitting: Cardiology

## 2024-08-15 ENCOUNTER — Other Ambulatory Visit: Payer: Self-pay

## 2024-08-15 DIAGNOSIS — I428 Other cardiomyopathies: Secondary | ICD-10-CM

## 2024-08-15 DIAGNOSIS — I4892 Unspecified atrial flutter: Secondary | ICD-10-CM

## 2024-08-15 DIAGNOSIS — R7989 Other specified abnormal findings of blood chemistry: Principal | ICD-10-CM

## 2024-08-15 DIAGNOSIS — R079 Chest pain, unspecified: Secondary | ICD-10-CM | POA: Diagnosis not present

## 2024-08-15 DIAGNOSIS — I2510 Atherosclerotic heart disease of native coronary artery without angina pectoris: Secondary | ICD-10-CM

## 2024-08-15 HISTORY — PX: CORONARY PRESSURE/FFR STUDY: CATH118243

## 2024-08-15 HISTORY — PX: LEFT HEART CATH AND CORONARY ANGIOGRAPHY: CATH118249

## 2024-08-15 LAB — CBC
HCT: 38.2 % (ref 36.0–46.0)
Hemoglobin: 12.7 g/dL (ref 12.0–15.0)
MCH: 32.5 pg (ref 26.0–34.0)
MCHC: 33.2 g/dL (ref 30.0–36.0)
MCV: 97.7 fL (ref 80.0–100.0)
Platelets: 202 K/uL (ref 150–400)
RBC: 3.91 MIL/uL (ref 3.87–5.11)
RDW: 13 % (ref 11.5–15.5)
WBC: 13.1 K/uL — ABNORMAL HIGH (ref 4.0–10.5)
nRBC: 0 % (ref 0.0–0.2)

## 2024-08-15 LAB — HEPARIN LEVEL (UNFRACTIONATED): Heparin Unfractionated: 0.46 [IU]/mL (ref 0.30–0.70)

## 2024-08-15 LAB — POCT ACTIVATED CLOTTING TIME: Activated Clotting Time: 262 s

## 2024-08-15 SURGERY — LEFT HEART CATH AND CORONARY ANGIOGRAPHY
Anesthesia: LOCAL

## 2024-08-15 MED ORDER — IOHEXOL 350 MG/ML SOLN
INTRAVENOUS | Status: DC | PRN
Start: 2024-08-15 — End: 2024-08-15
  Administered 2024-08-15: 52 mL

## 2024-08-15 MED ORDER — METOPROLOL TARTRATE 25 MG PO TABS
25.0000 mg | ORAL_TABLET | Freq: Two times a day (BID) | ORAL | Status: DC
  Administered 2024-08-15: 25 mg via ORAL
  Filled 2024-08-15: qty 1

## 2024-08-15 MED ORDER — SODIUM CHLORIDE 0.9% FLUSH
3.0000 mL | Freq: Two times a day (BID) | INTRAVENOUS | Status: DC
  Administered 2024-08-15 – 2024-08-16 (×2): 3 mL via INTRAVENOUS

## 2024-08-15 MED ORDER — HEPARIN SODIUM (PORCINE) 1000 UNIT/ML IJ SOLN
INTRAMUSCULAR | Status: AC
  Filled 2024-08-15: qty 10

## 2024-08-15 MED ORDER — ROSUVASTATIN CALCIUM 5 MG PO TABS
10.0000 mg | ORAL_TABLET | Freq: Every day | ORAL | Status: DC
  Administered 2024-08-15 – 2024-08-16 (×2): 10 mg via ORAL
  Filled 2024-08-15 (×2): qty 2

## 2024-08-15 MED ORDER — FREE WATER
500.0000 mL | Freq: Once | Status: AC
  Administered 2024-08-15: 500 mL via ORAL

## 2024-08-15 MED ORDER — NITROGLYCERIN 1 MG/10 ML FOR IR/CATH LAB
INTRA_ARTERIAL | Status: AC
  Filled 2024-08-15: qty 10

## 2024-08-15 MED ORDER — LIDOCAINE HCL (PF) 1 % IJ SOLN
INTRAMUSCULAR | Status: AC
  Filled 2024-08-15: qty 30

## 2024-08-15 MED ORDER — HEPARIN (PORCINE) IN NACL 2-0.9 UNITS/ML
INTRAMUSCULAR | Status: DC | PRN
  Administered 2024-08-15: 10 mL via INTRA_ARTERIAL

## 2024-08-15 MED ORDER — LIDOCAINE HCL (PF) 1 % IJ SOLN
INTRAMUSCULAR | Status: DC | PRN
Start: 2024-08-15 — End: 2024-08-15
  Administered 2024-08-15: 5 mL via INTRADERMAL

## 2024-08-15 MED ORDER — FENTANYL CITRATE (PF) 100 MCG/2ML IJ SOLN
INTRAMUSCULAR | Status: AC
  Filled 2024-08-15: qty 2

## 2024-08-15 MED ORDER — LABETALOL HCL 5 MG/ML IV SOLN
10.0000 mg | INTRAVENOUS | Status: AC | PRN
Start: 2024-08-15 — End: 2024-08-15

## 2024-08-15 MED ORDER — SODIUM CHLORIDE 0.9 % IV SOLN: 250.0000 mL | INTRAVENOUS | Status: DC | PRN

## 2024-08-15 MED ORDER — MIDAZOLAM HCL 2 MG/2ML IJ SOLN
INTRAMUSCULAR | Status: AC
Start: 2024-08-15 — End: 2024-08-15
  Filled 2024-08-15: qty 2

## 2024-08-15 MED ORDER — VERAPAMIL HCL 2.5 MG/ML IV SOLN
INTRAVENOUS | Status: AC
  Filled 2024-08-15: qty 2

## 2024-08-15 MED ORDER — HEPARIN (PORCINE) IN NACL 1000-0.9 UT/500ML-% IV SOLN
INTRAVENOUS | Status: DC | PRN
Start: 2024-08-15 — End: 2024-08-15
  Administered 2024-08-15 (×2): 500 mL

## 2024-08-15 MED ORDER — MIDAZOLAM HCL (PF) 2 MG/2ML IJ SOLN
INTRAMUSCULAR | Status: DC | PRN
Start: 2024-08-15 — End: 2024-08-15
  Administered 2024-08-15: 1 mg via INTRAVENOUS

## 2024-08-15 MED ORDER — SODIUM CHLORIDE 0.9% FLUSH: 3.0000 mL | INTRAVENOUS | Status: DC | PRN

## 2024-08-15 MED ORDER — FENTANYL CITRATE (PF) 100 MCG/2ML IJ SOLN
INTRAMUSCULAR | Status: DC | PRN
  Administered 2024-08-15: 25 ug via INTRAVENOUS

## 2024-08-15 MED ORDER — HYDRALAZINE HCL 20 MG/ML IJ SOLN: 10.0000 mg | INTRAMUSCULAR | Status: AC | PRN

## 2024-08-15 MED ORDER — HEPARIN SODIUM (PORCINE) 1000 UNIT/ML IJ SOLN
INTRAMUSCULAR | Status: DC | PRN
  Administered 2024-08-15 (×2): 3500 [IU] via INTRAVENOUS

## 2024-08-15 MED ORDER — APIXABAN 5 MG PO TABS
5.0000 mg | ORAL_TABLET | Freq: Two times a day (BID) | ORAL | Status: DC
  Administered 2024-08-15 – 2024-08-16 (×2): 5 mg via ORAL
  Filled 2024-08-15 (×2): qty 1

## 2024-08-15 MED ORDER — NITROGLYCERIN 1 MG/10 ML FOR IR/CATH LAB
INTRA_ARTERIAL | Status: DC | PRN
  Administered 2024-08-15: 200 ug via INTRACORONARY

## 2024-08-15 MED ORDER — ACETAMINOPHEN 325 MG PO TABS: 650.0000 mg | ORAL_TABLET | ORAL | Status: DC | PRN

## 2024-08-15 SURGICAL SUPPLY — 10 items
CATH INFINITI 5 FR JL3.5 (CATHETERS) IMPLANT
CATH INFINITI AMBI 5FR TG (CATHETERS) IMPLANT
CATH LAUNCHER 6FR JR4 (CATHETERS) IMPLANT
DEVICE RAD COMP TR BAND LRG (VASCULAR PRODUCTS) IMPLANT
GLIDESHEATH SLEND A-KIT 6F 22G (SHEATH) IMPLANT
GUIDEWIRE INQWIRE 1.5J.035X260 (WIRE) IMPLANT
GUIDEWIRE PRESSURE X 175 (WIRE) IMPLANT
KIT HEMO VALVE WATCHDOG (MISCELLANEOUS) IMPLANT
PACK CARDIAC CATHETERIZATION (CUSTOM PROCEDURE TRAY) ×1 IMPLANT
SET ATX-X65L (MISCELLANEOUS) IMPLANT

## 2024-08-15 NOTE — Progress Notes (Signed)
 PROGRESS NOTE    [name]  FMW:[id number] DOB: Mar 31, 1946 DOA: 08/12/2024 PCP: Yolande Toribio MATSU, MD   Brief Narrative:  Loretta Austin is a 78 y.o. female with medical history significant of hypertension, CAD, PAD, obstructive sleep apnea, collagenous colitis, rheumatoid arthritis and hyperlipidemia who presented with chest pain which she described as pressure-like.  This was associated with nausea and vomiting and hot flashes/diaphoresis but no shortness of breath.   She states that she has been having these hot flash symptoms for the past 2-3 months and that she has also had associated weight loss of 14 pounds. She states that she doesn't have much of an appetite. Appears that she was seeing oncology with the same complaint and she was started on Estroven to help.  They evaluated her unintentional weight loss as well and state that all evaluations have been negative thus far.   ED Course:  In the ER, B114/63P, HR 78, RR 15, O2 saturation 98% on RA,  and Tmax 98.5. Cbc demonstrated wbc21.6,  hb/hct 14.2/42.4, and platelet 304. Chemistry demonstrated Na 138, K 3.6, Cl 95, bicarb 23, Bun/Cr 24/0.83 and glucose 172.  Calcium  was 10.4.  anion gap 20. Initial troponin was 365 and repeat was 676.  CXR demonstrated no acute cardiopulmonary findings. Urinalysis was negative for acute infection. EKG initial was sinus bradycardia. Respiratory PCR was negative. CTA chest was negative for PE.  Assessment & Plan:   Principal Problem:   Chest pain Active Problems:   Non-ST elevation (NSTEMI) myocardial infarction (HCC)  NSTEMI/acute systolic congestive heart failure: Presented with chest pain and heart flashes.  patient's troponins continue to rise 365> 676> 2530 and  patient overnight-08/12/2024 developed atrial flutter, she was eventually started on heparin  drip by hospitalist.  Echo showed reduced 40 to 45% ejection fraction and grade 1 diastolic dysfunction along with wall motion abnormality  consistent with NSTEMI.  Cardiology on board, plan for cardiac cath later today.  Patient was given aspirin , remains on heparin .  New onset atrial flutter: Developed overnight 08/12/2024.  Started on heparin  drip.  Received diltiazem .  Converted to sinus rhythm on the morning of 08/13/2024.  Started on metoprolol  12.5 p.o. twice daily.  Hypertension: PTA on irbesartan which is currently on hold, blood pressure controlled.  Hyperlipidemia: Continue statin.  Rheumatoid arthritis: Patient on Humira.  Collagenous colitis: Followed by GI, started on budesonide  daily.  DVT prophylaxis: Heparin    Code Status: Full Code  Family Communication: Daughter present at bedside.  Plan of care discussed with patient in length and he/she verbalized understanding and agreed with it.  Status is: Inpatient Remains inpatient appropriate because: Plan of cardiac cath today.   Estimated body mass index is 25.04 kg/m as calculated from the following:   Height as of this encounter: 5' 5 (1.651 m).   Weight as of this encounter: 68.3 kg.    Nutritional Assessment: Body mass index is 25.04 kg/m.SABRA Seen by dietician.  I agree with the assessment and plan as outlined below: Nutrition Status:        . Skin Assessment: I have examined the patient's skin and I agree with the wound assessment as performed by the wound care RN as outlined below:    Consultants:  Cardiology  Procedures:  As above  Antimicrobials:  Anti-infectives (From admission, onward)    Start     Dose/Rate Route Frequency Ordered Stop   08/14/24 0100  vancomycin  (VANCOCIN ) IVPB 1000 mg/200 mL premix        1,000  mg 200 mL/hr over 60 Minutes Intravenous Every 24 hours 08/13/24 0834     08/13/24 2200  vancomycin  (VANCOCIN ) IVPB 1000 mg/200 mL premix  Status:  Discontinued        1,000 mg 200 mL/hr over 60 Minutes Intravenous Every 24 hours 08/13/24 0024 08/13/24 0834   08/13/24 0200  piperacillin -tazobactam (ZOSYN ) IVPB 3.375 g         3.375 g 12.5 mL/hr over 240 Minutes Intravenous Every 8 hours 08/13/24 0004     08/13/24 0115  vancomycin  (VANCOREADY) IVPB 1250 mg/250 mL        1,250 mg 166.7 mL/hr over 90 Minutes Intravenous  Once 08/13/24 0022 08/13/24 0224   08/12/24 1700  cefTRIAXone  (ROCEPHIN ) 1 g in sodium chloride  0.9 % 100 mL IVPB        1 g 200 mL/hr over 30 Minutes Intravenous  Once 08/12/24 1655 08/12/24 1830         Subjective: Seen and examined, nurse at the bedside.  Patient has no complaints.  She is just eager to get it over with and go home.  Objective: Vitals:   08/14/24 1640 08/14/24 2037 08/15/24 0011 08/15/24 0432  BP: (!) 142/91 136/67 131/80 121/71  Pulse:  (!) 111 (!) 117 87  Resp:  18 17 18   Temp: 97.8 F (36.6 C) 98.4 F (36.9 C) 98.9 F (37.2 C) 98.7 F (37.1 C)  TempSrc: Oral Oral Oral Oral  SpO2:  95% 95% 96%  Weight:      Height:        Intake/Output Summary (Last 24 hours) at 08/15/2024 0758 Last data filed at 08/15/2024 0600 Gross per 24 hour  Intake 880.17 ml  Output --  Net 880.17 ml   Filed Weights   08/12/24 1206 08/12/24 2228  Weight: 67.6 kg 68.3 kg    Examination:  General exam: Appears calm and comfortable  Respiratory system: Clear to auscultation. Respiratory effort normal. Cardiovascular system: S1 & S2 heard, RRR. No JVD, murmurs, rubs, gallops or clicks. No pedal edema. Gastrointestinal system: Abdomen is nondistended, soft and nontender. No organomegaly or masses felt. Normal bowel sounds heard. Central nervous system: Alert and oriented. No focal neurological deficits. Extremities: Symmetric 5 x 5 power. Skin: No rashes, lesions or ulcers.  Psychiatry: Judgement and insight appear normal. Mood & affect appropriate.   Data Reviewed: I have personally reviewed following labs and imaging studies  CBC: Recent Labs  Lab 08/12/24 1209 08/13/24 0226 08/14/24 0819 08/15/24 0359  WBC 21.6* 16.7* 10.1 13.1*  NEUTROABS  --   --  6.8  --    HGB 14.2 13.6 13.2 12.7  HCT 42.4 40.8 40.2 38.2  MCV 97.7 95.8 97.6 97.7  PLT 304 226 205 202   Basic Metabolic Panel: Recent Labs  Lab 08/12/24 1209 08/13/24 0226 08/13/24 0656 08/14/24 0819  NA 138  --  136 142  K 3.6  --  3.7 3.7  CL 95*  --  102 104  CO2 23  --  20* 24  GLUCOSE 172*  --  120* 118*  BUN 24*  --  11 13  CREATININE 0.83 0.76 0.90 0.90  CALCIUM  10.4*  --  8.6* 8.4*  MG  --   --  1.9  --    GFR: Estimated Creatinine Clearance: 47.1 mL/min (by C-G formula based on SCr of 0.9 mg/dL). Liver Function Tests: Recent Labs  Lab 08/12/24 1332  AST 33  ALT 10  ALKPHOS 58  BILITOT 0.8  PROT 7.5  ALBUMIN 4.7   Recent Labs  Lab 08/12/24 1332  LIPASE 22   No results for input(s): AMMONIA in the last 168 hours. Coagulation Profile: No results for input(s): INR, PROTIME in the last 168 hours. Cardiac Enzymes: Recent Labs  Lab 08/13/24 0656  CKTOTAL 343*   BNP (last 3 results) No results for input(s): PROBNP in the last 8760 hours. HbA1C: No results for input(s): HGBA1C in the last 72 hours. CBG: No results for input(s): GLUCAP in the last 168 hours. Lipid Profile: No results for input(s): CHOL, HDL, LDLCALC, TRIG, CHOLHDL, LDLDIRECT in the last 72 hours. Thyroid  Function Tests: Recent Labs    08/13/24 1041 08/14/24 0819  TSH 5.146*  --   FREET4  --  1.08   Anemia Panel: No results for input(s): VITAMINB12, FOLATE, FERRITIN, TIBC, IRON, RETICCTPCT in the last 72 hours. Sepsis Labs: No results for input(s): PROCALCITON, LATICACIDVEN in the last 168 hours.  Recent Results (from the past 240 hours)  Resp panel by RT-PCR (RSV, Flu A&B, Covid)     Status: None   Collection Time: 08/12/24  5:13 PM   Specimen: Nasal Swab  Result Value Ref Range Status   SARS Coronavirus 2 by RT PCR NEGATIVE NEGATIVE Final    Comment: (NOTE) SARS-CoV-2 target nucleic acids are NOT DETECTED.  The SARS-CoV-2 RNA is  generally detectable in upper respiratory specimens during the acute phase of infection. The lowest concentration of SARS-CoV-2 viral copies this assay can detect is 138 copies/mL. A negative result does not preclude SARS-Cov-2 infection and should not be used as the sole basis for treatment or other patient management decisions. A negative result may occur with  improper specimen collection/handling, submission of specimen other than nasopharyngeal swab, presence of viral mutation(s) within the areas targeted by this assay, and inadequate number of viral copies(<138 copies/mL). A negative result must be combined with clinical observations, patient history, and epidemiological information. The expected result is Negative.  Fact Sheet for Patients:  bloggercourse.com  Fact Sheet for Healthcare Providers:  seriousbroker.it  This test is no t yet approved or cleared by the [place]  FDA and  has been authorized for detection and/or diagnosis of SARS-CoV-2 by FDA under an Emergency Use Authorization (EUA). This EUA will remain  in effect (meaning this test can be used) for the duration of the COVID-19 declaration under Section 564(b)(1) of the Act, 21 U.S.C.section 360bbb-3(b)(1), unless the authorization is terminated  or revoked sooner.       Influenza A by PCR NEGATIVE NEGATIVE Final   Influenza B by PCR NEGATIVE NEGATIVE Final    Comment: (NOTE) The Xpert Xpress SARS-CoV-2/FLU/RSV plus assay is intended as an aid in the diagnosis of influenza from Nasopharyngeal swab specimens and should not be used as a sole basis for treatment. Nasal washings and aspirates are unacceptable for Xpert Xpress SARS-CoV-2/FLU/RSV testing.  Fact Sheet for Patients: bloggercourse.com  Fact Sheet for Healthcare Providers: seriousbroker.it  This test is not yet approved or cleared by the [place]  [place] FDA and has been authorized for detection and/or diagnosis of SARS-CoV-2 by FDA under an Emergency Use Authorization (EUA). This EUA will remain in effect (meaning this test can be used) for the duration of the COVID-19 declaration under Section 564(b)(1) of the Act, 21 U.S.C. section 360bbb-3(b)(1), unless the authorization is terminated or revoked.     Resp Syncytial Virus by PCR NEGATIVE NEGATIVE Final    Comment: (NOTE) Fact Sheet for Patients: bloggercourse.com  Fact  Sheet for Healthcare Providers: seriousbroker.it  This test is not yet approved or cleared by the [place]  FDA and has been authorized for detection and/or diagnosis of SARS-CoV-2 by FDA under an Emergency Use Authorization (EUA). This EUA will remain in effect (meaning this test can be used) for the duration of the COVID-19 declaration under Section 564(b)(1) of the Act, 21 U.S.C. section 360bbb-3(b)(1), unless the authorization is terminated or revoked.  Performed at [facility], [address] [place], [place] [address]   Culture, blood (routine x 2)     Status: None (Preliminary result)   Collection Time: 08/12/24  5:30 PM   Specimen: BLOOD  Result Value Ref Range Status   Specimen Description   Final    BLOOD BLOOD RIGHT WRIST Performed at [facility], [address] [place], [place] [address]    Special Requests   Final    Blood Culture adequate volume Performed at [facility], [address] [place], [place] [address]    Culture   Final    NO GROWTH 3 DAYS Performed at [facility], [address], [place], [place] [address]    Report Status PENDING  Incomplete  Culture, blood (routine x 2)     Status: None (Preliminary result)   Collection Time: 08/12/24  5:45 PM   Specimen: BLOOD  Result Value Ref Range Status   Specimen Description   Final    BLOOD LEFT  ANTECUBITAL Performed at [facility], [address], [place], [place] [address]    Special Requests   Final    Blood Culture adequate volume Performed at [facility], [address], [place], [place] [address]    Culture   Final    NO GROWTH 3 DAYS Performed at [facility], [address] [place], [place] [address]    Report Status PENDING  Incomplete     Radiology Studies: ECHOCARDIOGRAM COMPLETE Result Date: 08/13/2024    ECHOCARDIOGRAM REPORT   Patient Name:   Loretta Austin Date of Exam: 08/13/2024 Medical Rec #:  [id number]      Height:       65.0 in Accession #:    [id number]     Weight:       150.5 lb Date of Birth:  04-16-1946      BSA:          1.753 m Patient Age:    78 years       BP:           113/90 mmHg Patient Gender: F              HR:           91 bpm. Exam Location:  Inpatient Procedure: 2D Echo, Cardiac Doppler and Color Doppler (Both Spectral and Color            Flow Doppler were utilized during procedure). Indications:    Chest Pain R07.9  History:        Patient has no prior history of Echocardiogram examinations.                 CAD, PAD, Signs/Symptoms:Chest Pain; Risk Factors:Hypertension,                 Sleep Apnea and Dyslipidemia.  Sonographer:    Thea Norlander RCS Referring Phys: BRADLY MARLA DRONES IMPRESSIONS  1. Akinesis of the distal septum and hypokinesis of the apex with overall mild to moderate  LV dysfunction.  2. Left ventricular ejection fraction, by estimation, is 40 to 45%. The left ventricle has mildly decreased function. The left ventricle demonstrates regional wall motion abnormalities (see scoring diagram/findings for description). There is mild left ventricular hypertrophy. Left ventricular diastolic parameters are consistent with Grade I diastolic dysfunction (impaired relaxation).  3. Right ventricular systolic function is normal. The right ventricular size is normal.  4. The mitral valve is normal in  structure. No evidence of mitral valve regurgitation. No evidence of mitral stenosis.  5. The aortic valve is tricuspid. Aortic valve regurgitation is not visualized. No aortic stenosis is present.  6. The inferior vena cava is normal in size with greater than 50% respiratory variability, suggesting right atrial pressure of 3 mmHg. Comparison(s): No prior Echocardiogram. FINDINGS  Left Ventricle: Left ventricular ejection fraction, by estimation, is 40 to 45%. The left ventricle has mildly decreased function. The left ventricle demonstrates regional wall motion abnormalities. The left ventricular internal cavity size was normal in size. There is mild left ventricular hypertrophy. Left ventricular diastolic parameters are consistent with Grade I diastolic dysfunction (impaired relaxation). Right Ventricle: The right ventricular size is normal. Right ventricular systolic function is normal. Left Atrium: Left atrial size was normal in size. Right Atrium: Right atrial size was normal in size. Pericardium: Trivial pericardial effusion is present. Mitral Valve: The mitral valve is normal in structure. Mild mitral annular calcification. No evidence of mitral valve regurgitation. No evidence of mitral valve stenosis. Tricuspid Valve: The tricuspid valve is normal in structure. Tricuspid valve regurgitation is trivial. No evidence of tricuspid stenosis. Aortic Valve: The aortic valve is tricuspid. Aortic valve regurgitation is not visualized. No aortic stenosis is present. Aortic valve peak gradient measures 7.4 mmHg. Pulmonic Valve: The pulmonic valve was not well visualized. Pulmonic valve regurgitation is trivial. No evidence of pulmonic stenosis. Aorta: The aortic root is normal in size and structure. Venous: The inferior vena cava is normal in size with greater than 50% respiratory variability, suggesting right atrial pressure of 3 mmHg. IAS/Shunts: No atrial level shunt detected by color flow Doppler. Additional  Comments: Akinesis of the distal septum and hypokinesis of the apex with overall mild to moderate LV dysfunction.  LEFT VENTRICLE PLAX 2D LVIDd:         4.80 cm   Diastology LVIDs:         3.40 cm   LV e' medial:    5.00 cm/s LV PW:         1.20 cm   LV E/e' medial:  11.7 LV IVS:        1.00 cm   LV e' lateral:   7.07 cm/s LVOT diam:     2.00 cm   LV E/e' lateral: 8.2 LV SV:         40 LV SV Index:   23 LVOT Area:     3.14 cm  RIGHT VENTRICLE            IVC RV S prime:     8.59 cm/s  IVC diam: 1.50 cm TAPSE (M-mode): 1.9 cm LEFT ATRIUM             Index        RIGHT ATRIUM           Index LA diam:        3.80 cm 2.17 cm/m   RA Area:     10.90 cm LA Vol (A2C):   36.6 ml 20.88 ml/m  RA Volume:  21.10 ml  12.04 ml/m LA Vol (A4C):   30.4 ml 17.34 ml/m LA Biplane Vol: 33.7 ml 19.22 ml/m  AORTIC VALVE AV Area (Vmax): 1.73 cm AV Vmax:        136.00 cm/s AV Peak Grad:   7.4 mmHg LVOT Vmax:      74.70 cm/s LVOT Vmean:     49.700 cm/s LVOT VTI:       0.128 m  AORTA Ao Root diam: 3.30 cm Ao Asc diam:  3.50 cm MITRAL VALVE               TRICUSPID VALVE MV Area (PHT): 4.10 cm    TR Peak grad:   6.6 mmHg MV Decel Time: 185 msec    TR Vmax:        128.00 cm/s MV E velocity: 58.30 cm/s MV A velocity: 74.70 cm/s  SHUNTS MV E/A ratio:  0.78        Systemic VTI:  0.13 m                            Systemic Diam: 2.00 cm Redell Shallow MD Electronically signed by Redell Shallow MD Signature Date/Time: 08/13/2024/11:40:34 AM    Final     Scheduled Meds:  aspirin  EC  81 mg Oral Daily   budesonide   9 mg Oral Daily   cycloSPORINE   1 drop Both Eyes BID   free water   500 mL Oral Once   metoprolol  tartrate  12.5 mg Oral BID   venlafaxine  XR  37.5 mg Oral Q breakfast   Continuous Infusions:  heparin  1,000 Units/hr (08/15/24 0600)   piperacillin -tazobactam (ZOSYN )  IV Stopped (08/15/24 0533)   vancomycin  Stopped (08/15/24 0116)     LOS: 3 days   Fredia Skeeter, MD [facility]  08/15/2024, 7:58 AM   *Please  note that this is a verbal dictation therefore any spelling or grammatical errors are due to the Dragon Medical One system interpretation.  Please page via Amion and do not message via secure chat for urgent patient care matters. Secure chat can be used for non urgent patient care matters.  How to contact the [facility] Attending or Consulting provider 7A - 7P or covering provider during after hours 7P -7A, for this patient?  Check the care team in [facility] and look for a) attending/consulting TRH provider listed and b) the [facility] team listed. Page or secure chat 7A-7P. Log into [email or web address] and use [place]'s universal password to access. If you do not have the password, please contact the hospital operator. Locate the [facility] provider you are looking for under [facility] and page to a number that you can be directly reached. If you still have difficulty reaching the provider, please page the [facility] (Director on Call) for the Hospitalists listed on amion for assistance.

## 2024-08-15 NOTE — Interval H&P Note (Signed)
 History and Physical Interval Note:  08/15/2024 3:45 PM  Loretta Austin  has presented today for surgery, with the diagnosis of NSTEMI.  The various methods of treatment have been discussed with the patient and family. After consideration of risks, benefits and other options for treatment, the patient has consented to  Procedure(s): LEFT HEART CATH AND CORONARY ANGIOGRAPHY (N/A) as a surgical intervention.  The patient's history has been reviewed, patient examined, no change in status, stable for surgery.  I have reviewed the patient's chart and labs.  Questions were answered to the patient's satisfaction.     Loretta Austin

## 2024-08-15 NOTE — TOC Initial Note (Signed)
 Transition of Care [facility]) - Initial/Assessment Note    Patient Details  Name: Loretta Austin MRN: [id number] Date of Birth: 11-24-1945  Transition of Care [facility]) CM/SW Contact:    Sudie Erminio Deems, RN Phone Number: 08/15/2024, 3:54 PM  Clinical Narrative: Patient presented for chest pain-plan for cath today. PTA patient was independent from home alone. Patient has insurance and PCP- patient drives to appointments. No home needs identified during this visit.                 Expected Discharge Plan: Home/Self Care Barriers to Discharge: No Barriers Identified   Patient Goals and CMS Choice Patient states their goals for this hospitalization and ongoing recovery are:: plan to return home once stable.   Choice offered to / list presented to : NA      Expected Discharge Plan and Services In-house Referral: NA Discharge Planning Services: CM Consult Post Acute Care Choice: NA Living arrangements for the past 2 months: Single Family Home                   DME Agency: NA       HH Arranged: NA   Prior Living Arrangements/Services Living arrangements for the past 2 months: Single Family Home Lives with:: Self Patient language and need for interpreter reviewed:: Yes Do you feel safe going back to the place where you live?: Yes      Need for Family Participation in Patient Care: No (Comment) Care giver support system in place?: No (comment)   Criminal Activity/Legal Involvement Pertinent to Current Situation/Hospitalization: No - Comment as needed  Activities of Daily Living   ADL Screening (condition at time of admission) Independently performs ADLs?: Yes (appropriate for developmental age)  Permission Sought/Granted Permission sought to share information with : Case Manager   Emotional Assessment Appearance:: Appears stated age Attitude/Demeanor/Rapport: Engaged Affect (typically observed): Appropriate Orientation: : Oriented to Self, Oriented to Place,  Oriented to  Time, Oriented to Situation Alcohol  / Substance Use: Not Applicable Psych Involvement: No (comment)  Admission diagnosis:  Elevated troponin [R79.89] Chest pain [R07.9] Leukocytosis, unspecified type [D72.829] Nausea and vomiting, unspecified vomiting type [R11.2] Patient Active Problem List   Diagnosis Date Noted   Non-ST elevation (NSTEMI) myocardial infarction (HCC) 08/14/2024   Chest pain 08/12/2024   Neutrophilia 07/19/2024   PAD (peripheral artery disease) 01/16/2023   Preoperative cardiovascular examination 01/16/2023   Hyperlipidemia LDL goal <70 01/16/2023   Coronary artery calcification 05/30/2020   Hyponatremia 03/08/2020   UTI (urinary tract infection) 03/08/2020   Microscopic colitis    Hypokalemia    Renal insufficiency    Combined abdominal pain, vomiting, and diarrhea    Hypertension    Rheumatoid arthritis (HCC)    Obstructive sleep apnea 04/25/2018   Multiple lung nodules 04/25/2018   Cough 04/25/2018   PCP:  Yolande Toribio MATSU, MD Pharmacy:   [facility] [phone] [place], [place] - [address] AT [facility] [address] [facility] [address] [place] [address] Phone: [phone] Fax: [phone]  [facility] - [place], [place] - [address] [place] [address] Phone: [phone] Fax: [phone]     Social Drivers of Health (SDOH) Social History: SDOH Screenings   Food Insecurity: No Food Insecurity (08/13/2024)  Housing: Low Risk  (08/13/2024)  Transportation Needs: No Transportation Needs (08/13/2024)  Utilities: Not At Risk (08/13/2024)  Depression (PHQ2-9): Low Risk  (07/19/2024)  Social Connections: Unknown (  08/13/2024)  Tobacco Use: Medium Risk (04/21/2024)   SDOH Interventions:     Readmission Risk Interventions     No data to display

## 2024-08-15 NOTE — Progress Notes (Addendum)
  Progress Note  Patient Name: Loretta Austin Date of Encounter: 08/15/2024 [facility] Cardiologist: Aleene Passe, MD (Inactive)   Interval Summary   Resting comfortably in bed Reports poor sleep over the past few days  No significant symptoms this morning  Remains in sinus, brief ventricular trigeminy noted this AM around 8 AM  Vital Signs Vitals:   08/14/24 1640 08/14/24 2037 08/15/24 0011 08/15/24 0432  BP: (!) 142/91 136/67 131/80 121/71  Pulse:  (!) 111 (!) 117 87  Resp:  18 17 18   Temp: 97.8 F (36.6 C) 98.4 F (36.9 C) 98.9 F (37.2 C) 98.7 F (37.1 C)  TempSrc: Oral Oral Oral Oral  SpO2:  95% 95% 96%  Weight:      Height:        Intake/Output Summary (Last 24 hours) at 08/15/2024 0824 Last data filed at 08/15/2024 0600 Gross per 24 hour  Intake 760.17 ml  Output --  Net 760.17 ml      08/12/2024   10:28 PM 08/12/2024   12:06 PM 07/19/2024    2:44 PM  Last 3 Weights  Weight (lbs) 150 lb 8 oz 149 lb 154 lb 1.6 oz  Weight (kg) 68.266 kg 67.586 kg 69.899 kg     Telemetry/ECG  Sinus rhythm, HR 70s, brief ventricular trigeminy noted this AM around 8 AM - Personally Reviewed  Physical Exam  GEN: No acute distress.   Neck: No JVD Cardiac: RRR, no murmurs, rubs, or gallops.  Respiratory: Clear to auscultation bilaterally. GI: Soft, nontender, non-distended  MS: No edema  Assessment & Plan   NSTEMI Presented with N/V, fatigue  Troponin 365 ? 676 ? 2,530 EKG with new TWI leads V1-V2 CT chest showed aortic atherosclerosis Echo showed LVEF 40-45% with RWMA Continue ASA 81 mg daily Continue IV heparin   Continue Lopressor  12.5 mg BID Scheduled for LHC today, further recs to follow    Cardiomyopathy, new Echo showed LVEF 40-45% with RWMA Presume ischemic etiology  Continue Lopressor  12.5 mg BID Pending results of cath will add/titrate medications    Paroxysmal atrial flutter  No prior known history  Occurs when she has severe hot  flashes/diaphoresis prior to admission  Converted to NSR Remains in sinus, brief ventricular trigeminy noted this AM around 8 AM TSH elevated, T4 normal, T3 pending  Continue Lopressor  12.5 mg BID Continue IV heparin   Hyperlipidemia LDL 11/2022 noted to be 65  Previously on statin, was not started this admission  Will start today Continue Crestor  10 mg daily    For questions or updates, please contact [facility] Please consult [email or web address] for contact info under     Signed, Waddell DELENA Donath, PA-C

## 2024-08-15 NOTE — Plan of Care (Signed)

## 2024-08-15 NOTE — Progress Notes (Signed)
 PHARMACY - ANTICOAGULATION CONSULT NOTE  Pharmacy Consult for heparin  Indication: chest pain/ACS  Allergies  Allergen Reactions   Ciprofloxacin Rash   Metronidazole Rash    Patient Measurements: Height: 5' 5 (165.1 cm) Weight: 68.3 kg (150 lb 8 oz) IBW/kg (Calculated) : 57 HEPARIN  DW (KG): 68.3  Vital Signs: Temp: 98.4 F (36.9 C) (11/24 0809) Temp Source: Oral (11/24 0809) BP: 160/80 (11/24 0809) Pulse Rate: 86 (11/24 0809)  Labs: Recent Labs    08/13/24 0226 08/13/24 0656 08/13/24 0657 08/13/24 1228 08/13/24 2038 08/14/24 0819 08/15/24 0359  HGB 13.6  --   --   --   --  13.2 12.7  HCT 40.8  --   --   --   --  40.2 38.2  PLT 226  --   --   --   --  205 202  HEPARINUNFRC  --   --   --  0.48 0.43  --  0.46  CREATININE 0.76 0.90  --   --   --  0.90  --   CKTOTAL  --  343*  --   --   --   --   --   TROPONINIHS  --   --  2,530*  --   --   --   --     Estimated Creatinine Clearance: 47.1 mL/min (by C-G formula based on SCr of 0.9 mg/dL).   Medical History: Past Medical History:  Diagnosis Date   Arthritis    Cholecystolithiasis    Colon polyps    Diverticulosis    Food poisoning    GERD (gastroesophageal reflux disease)    Heart murmur    High blood pressure    Hyperlipidemia    Microscopic colitis    Osteoporosis    PAD (peripheral artery disease) 01/16/2023   Lower extremity arterial Dopplers 02/06/2023: Right-no stenosis, mild-moderate atherosclerosis throughout especially in CFA and calf vessels  ABIs 02/06/2023: Right 0.91, left 1.12   Sjogren's disease    Sleep apnea    wears appliance   Assessment: 5 yoF presented with hot flashes and N/V/D with reported chest pain on 11/20. Previous heparin  consult for ACS, but low suspicion. No AC PTA. Pharmacy re-consulted to dose heparin  for atrial fibrillation.   Heparin  level remains therapeutic, CBC ok.  Goal of Therapy:  Heparin  level 0.3-0.7 units/ml Monitor platelets by anticoagulation protocol:  Yes   Plan:  Continue heparin  infusion at 1000 units/hr Check anti-Xa level daily while on heparin  Continue to monitor H&H and platelets   Ozell Jamaica, PharmD, BCPS, [facility] Clinical Pharmacist [phone] Please check AMION for all [facility] Pharmacy numbers 08/15/2024

## 2024-08-16 ENCOUNTER — Other Ambulatory Visit (HOSPITAL_COMMUNITY): Payer: Self-pay

## 2024-08-16 ENCOUNTER — Telehealth (HOSPITAL_COMMUNITY): Payer: Self-pay

## 2024-08-16 DIAGNOSIS — R7989 Other specified abnormal findings of blood chemistry: Secondary | ICD-10-CM | POA: Diagnosis not present

## 2024-08-16 DIAGNOSIS — I4892 Unspecified atrial flutter: Secondary | ICD-10-CM | POA: Diagnosis not present

## 2024-08-16 DIAGNOSIS — I428 Other cardiomyopathies: Secondary | ICD-10-CM | POA: Diagnosis not present

## 2024-08-16 LAB — CBC
HCT: 39.3 % (ref 36.0–46.0)
Hemoglobin: 13.1 g/dL (ref 12.0–15.0)
MCH: 32.5 pg (ref 26.0–34.0)
MCHC: 33.3 g/dL (ref 30.0–36.0)
MCV: 97.5 fL (ref 80.0–100.0)
Platelets: 195 K/uL (ref 150–400)
RBC: 4.03 MIL/uL (ref 3.87–5.11)
RDW: 13.1 % (ref 11.5–15.5)
WBC: 10.9 K/uL — ABNORMAL HIGH (ref 4.0–10.5)
nRBC: 0 % (ref 0.0–0.2)

## 2024-08-16 LAB — BASIC METABOLIC PANEL WITH GFR
Anion gap: 11 (ref 5–15)
BUN: 16 mg/dL (ref 8–23)
CO2: 21 mmol/L — ABNORMAL LOW (ref 22–32)
Calcium: 8.3 mg/dL — ABNORMAL LOW (ref 8.9–10.3)
Chloride: 107 mmol/L (ref 98–111)
Creatinine, Ser: 0.79 mg/dL (ref 0.44–1.00)
GFR, Estimated: >60 mL/min (ref >60)
Glucose, Bld: 99 mg/dL (ref 70–99)
Potassium: 3.3 mmol/L — ABNORMAL LOW (ref 3.5–5.1)
Sodium: 139 mmol/L (ref 135–145)

## 2024-08-16 LAB — T3: T3, Total: 214 ng/dL — ABNORMAL HIGH (ref 71–180)

## 2024-08-16 MED ORDER — POTASSIUM CHLORIDE CRYS ER 20 MEQ PO TBCR
40.0000 meq | EXTENDED_RELEASE_TABLET | ORAL | Status: DC
  Administered 2024-08-16: 40 meq via ORAL
  Filled 2024-08-16: qty 2

## 2024-08-16 MED ORDER — METOPROLOL SUCCINATE ER 25 MG PO TB24
25.0000 mg | ORAL_TABLET | Freq: Two times a day (BID) | ORAL | Status: DC
  Administered 2024-08-16: 25 mg via ORAL
  Filled 2024-08-16: qty 1

## 2024-08-16 MED ORDER — APIXABAN 5 MG PO TABS
5.0000 mg | ORAL_TABLET | Freq: Two times a day (BID) | ORAL | 0 refills | Status: DC
  Filled 2024-08-16: qty 60, 30d supply, fill #0

## 2024-08-16 MED ORDER — LOSARTAN POTASSIUM 25 MG PO TABS
25.0000 mg | ORAL_TABLET | Freq: Every day | ORAL | Status: DC
  Administered 2024-08-16: 25 mg via ORAL
  Filled 2024-08-16: qty 1

## 2024-08-16 MED ORDER — METOPROLOL SUCCINATE ER 25 MG PO TB24
25.0000 mg | ORAL_TABLET | Freq: Two times a day (BID) | ORAL | 0 refills | Status: DC
  Filled 2024-08-16: qty 60, 30d supply, fill #0

## 2024-08-16 NOTE — Progress Notes (Signed)
 Heart Failure Nurse Navigator Progress Note  PCP: Yolande Toribio MATSU, MD PCP-Cardiologist: Former [facility]ser Admission Diagnosis: None Admitted from: Home  Presentation:   Loretta Austin presented with chest pressure, back pain, hot flashes and N/V/D. BP 151/76, HR 74, BNP 343, Troponin 2,530.SABRACXR demonstrated no acute cardiopulmonary findings. Urinalysis was negative for acute infection. EKG initial was sinus bradycardia. Respiratory PCR was negative. CTA chest was negative for PE.  Developed aflutter with RVR, started on diltiazem . Had a left heart cath on 08/15/2024 and showed nonobstructive CAD.   Patient was educated on the sign and symptoms of heart failure, daily weights, when to call her doctor or go to the ED. Diet/ fluid restrictions, taking all her medications as prescribed and attending all medical appointments. Patient verbalized her understanding of all education. A HF TOC appointment was scheduled for 08/22/2024 @ 9:30 am.   ECHO/ LVEF: 40-45%   Clinical Course:  Past Medical History:  Diagnosis Date   Arthritis    Cholecystolithiasis    Colon polyps    Diverticulosis    Food poisoning    GERD (gastroesophageal reflux disease)    Heart murmur    High blood pressure    Hyperlipidemia    Microscopic colitis    Osteoporosis    PAD (peripheral artery disease) 01/16/2023   Lower extremity arterial Dopplers 02/06/2023: Right-no stenosis, mild-moderate atherosclerosis throughout especially in CFA and calf vessels  ABIs 02/06/2023: Right 0.91, left 1.12   Sjogren's disease    Sleep apnea    wears appliance     Social History   Socioeconomic History   Marital status: Widowed    Spouse name: Not on file   Number of children: 1   Years of education: Not on file   Highest education level: Not on file  Occupational History   Occupation: retired  Tobacco Use   Smoking status: Former    Current packs/day: 0.00    Types: Cigarettes    Quit date: 09/23/1983    Years since  quitting: 40.9   Smokeless tobacco: Never  Vaping Use   Vaping status: Never Used  Substance and Sexual Activity   Alcohol  use: Yes    Alcohol /week: 1.0 standard drink of alcohol     Types: 1 Standard drinks or equivalent per week    Comment: occ   Drug use: Not Currently   Sexual activity: Not Currently    Partners: Male  Other Topics Concern   Not on file  Social History Narrative   Not on file   Social Drivers of Health   Financial Resource Strain: Not on file  Food Insecurity: No Food Insecurity (08/13/2024)   Hunger Vital Sign    Worried About Running Out of Food in the Last Year: Never true    Ran Out of Food in the Last Year: Never true  Transportation Needs: No Transportation Needs (08/13/2024)   PRAPARE - Administrator, Civil Service (Medical): No    Lack of Transportation (Non-Medical): No  Physical Activity: Not on file  Stress: Not on file  Social Connections: Unknown (08/13/2024)   Social Connection and Isolation Panel    Frequency of Communication with Friends and Family: More than three times a week    Frequency of Social Gatherings with Friends and Family: More than three times a week    Attends Religious Services: Patient declined    Database Administrator or Organizations: Patient declined    Attends Banker Meetings: Patient declined  Marital Status: Patient declined   Education Assessment and Provision:  Detailed education and instructions provided on heart failure disease management including the following:  Signs and symptoms of Heart Failure When to call the physician Importance of daily weights Low sodium diet Fluid restriction Medication management Anticipated future follow-up appointments  Patient education given on each of the above topics.  Patient acknowledges understanding via teach back method and acceptance of all instructions.  Education Materials:  Living Better With Heart Failure Booklet, HF zone tool,  & Daily Weight Tracker Tool.  Patient has scale at home: Yes Patient has pill box at home: Yes    High Risk Criteria for Readmission and/or Poor Patient Outcomes: Heart failure hospital admissions (last 6 months): 0  No Show rate: 0 Difficult social situation: No Demonstrates medication adherence: Yes Primary Language: English  Literacy level: Reading, writing, and comprehension.   Barriers of Care:   Diet/ fluid restrictions Daily weights  Considerations/Referrals:   Referral made to Heart Failure Pharmacist Stewardship: yes Referral made to Heart Failure CSW/NCM TOC: NA Referral made to Heart & Vascular TOC clinic: Yes, 08/22/2024 @ 9:30 am.   Items for Follow-up on DC/TOC: Continued HF education Diet/ fluid restrictions/ daily weights   Stephane Haddock, BSN, RN Heart Failure Teacher, Adult Education Only

## 2024-08-16 NOTE — Telephone Encounter (Signed)
 Pharmacy Patient Advocate Encounter  Insurance verification completed.    The patient is insured through [employer]. Patient has Medicare and is not eligible for a copay card, but may be able to apply for patient assistance or Medicare RX Payment Plan (Patient Must reach out to their plan, if eligible for payment plan), if available.    Ran test claim for Eliquis 5mg  and the current 30 day co-pay is $0.   This test claim was processed through [facility]- copay amounts may vary at other pharmacies due to [employer], or as the patient moves through the different stages of their insurance plan.

## 2024-08-16 NOTE — Discharge Summary (Signed)
 Physician Discharge Summary  Loretta Austin FMW:[id number] DOB: August 08, 1946 DOA: 08/12/2024  PCP: Yolande Toribio MATSU, MD  Admit date: 08/12/2024 Discharge date: 08/16/2024 30 Day Unplanned Readmission Risk Score    Flowsheet Row ED to Hosp-Admission (Current) from 08/12/2024 in [facility]  30 Day Unplanned Readmission Risk Score (%) 17.22 Filed at 08/16/2024 0801    This score is the patient's risk of an unplanned readmission within 30 days of being discharged (0 -100%). The score is based on dignosis, age, lab data, medications, orders, and past utilization.   Low:  0-14.9   Medium: 15-21.9   High: 22-29.9   Extreme: 30 and above          Admitted From: Home Disposition: Home  Recommendations for Outpatient Follow-up:  Follow up with PCP in 1-2 weeks Please obtain BMP/CBC in one week Follow-up with cardiology per their scheduled time and date Please follow up with your PCP on the following pending results: Unresulted Labs (From admission, onward)     Start     Ordered   08/16/24 0500  Basic metabolic panel  Daily,   R     Question:  Specimen collection method  Answer:  Lab=Lab collect   08/15/24 0756   08/16/24 0500  Lipoprotein A (LPA)  Tomorrow morning,   R       Question:  Specimen collection method  Answer:  Lab=Lab collect   08/15/24 1632   08/15/24 0500  CBC  Daily,   R     Question:  Specimen collection method  Answer:  Lab=Lab collect   08/14/24 0848              Home Health: None-PT recommended outpatient PT Equipment/Devices: None none  Discharge Condition: Stable CODE STATUS: Full code Diet recommendation:  Diet Order             Diet Heart Room service appropriate? Yes; Fluid consistency: Thin  Diet effective now                   Subjective: Seen and examined, she is totally asymptomatic.  Excited about going home.  Brief/Interim Summary: Loretta Austin is a 78 y.o. female with medical history significant of  hypertension, CAD, PAD, obstructive sleep apnea, collagenous colitis, rheumatoid arthritis and hyperlipidemia who presented with chest pain which she described as pressure-like.  This was associated with nausea and vomiting and hot flashes/diaphoresis but no shortness of breath.   She states that she has been having these hot flash symptoms for the past 2-3 months and that she has also had associated weight loss of 14 pounds. She states that she doesn't have much of an appetite. Appears that she was seeing oncology with the same complaint and she was started on Estroven to help.  They evaluated her unintentional weight loss as well and state that all evaluations have been negative thus far.   Upon arrival to ED, she was hemodynamically stable.  Initial troponin was 365 and repeat was 676.  CXR demonstrated no acute cardiopulmonary findings.  EKG initial was sinus bradycardia. CTA chest was negative for PE.  Admitted to hospital service, cardiology consulted, details below.   NSTEMI/acute systolic and diastolic congestive heart failure: Presented with chest pain and heart flashes.  patient's troponins continue to rise 365> 676> 2530 and  patient overnight-08/12/2024 developed atrial flutter, she was eventually started on heparin  drip by hospitalist.  Echo showed reduced 40 to 45% ejection fraction and grade 1 diastolic dysfunction  along with wall motion abnormality consistent with NSTEMI.  Cardiology on board, status post cardiac cath 08/15/2024 which showed nonobstructive CAD.  Patient symptom-free, cleared by cardiology.  They will follow-up with her as outpatient.   New onset atrial flutter: Developed overnight 08/12/2024.  Started on heparin  drip.  Received diltiazem .  Converted to sinus rhythm on the morning of 08/13/2024.  Started on metoprolol  12.5 p.o. twice daily which is being transition to Toprol -XL 25 daily at discharge.  Discharging on Eliquis .   Hypertension: PTA on irbesartan which is being  resumed.   Hyperlipidemia: Continue statin.   Rheumatoid arthritis: Patient on Humira.   Collagenous colitis: Followed by GI, started on budesonide  daily.  Discharge Diagnoses:  Principal Problem:   Chest pain Active Problems:   Non-ST elevation (NSTEMI) myocardial infarction (HCC)   Elevated troponin   Nonischemic cardiomyopathy (HCC)   Paroxysmal atrial flutter [facility])    Discharge Instructions  Discharge Instructions     AMB referral to Phase II Cardiac Rehabilitation   Complete by: As directed    Diagnosis: NSTEMI   After initial evaluation and assessments completed: Virtual Based Care may be provided alone or in conjunction with Phase 2 Cardiac Rehab based on patient barriers.: Yes   Intensive Cardiac Rehabilitation (ICR) [facility] location only OR Traditional Cardiac Rehabilitation (TCR) *If criteria for ICR are not met will enroll in TCR ([facility] only): Yes      Allergies as of 08/16/2024       Reactions   Ciprofloxacin Rash   Metronidazole Rash        Medication List     STOP taking these medications    ibuprofen 200 MG tablet Commonly known as: ADVIL   nitrofurantoin (macrocrystal-monohydrate) 100 MG capsule Commonly known as: MACROBID       TAKE these medications    Align Prebiotic-Probiotic 5-1.25 MG-GM Chew Chew 1 Dose by mouth daily.   apixaban  5 MG Tabs tablet Commonly known as: ELIQUIS  Take 1 tablet (5 mg total) by mouth 2 (two) times daily.   budesonide  3 MG 24 hr capsule Commonly known as: ENTOCORT EC  Take 3 capsules (9 mg total) by mouth daily. What changed: how much to take   Humira (2 Pen) 40 MG/0.4ML pen Generic drug: adalimumab Inject 40 mg into the skin every 14 (fourteen) days. Saturday's   irbesartan 150 MG tablet Commonly known as: AVAPRO Take 150 mg by mouth daily.   Magnesium 400 MG Tabs Take 1 tablet by mouth daily in the afternoon.   MELATONIN PO Take 10 mg by mouth at bedtime.   metoprolol  succinate 25 MG 24 hr  tablet Commonly known as: TOPROL -XL Take 1 tablet (25 mg total) by mouth 2 (two) times daily.   MULTIVITAMIN/IRON PO Take 1 tablet by mouth daily.   Restasis  0.05 % ophthalmic emulsion Generic drug: cycloSPORINE  Place 1 drop into both eyes 2 (two) times daily.   rosuvastatin  10 MG tablet Commonly known as: CRESTOR  Take 1 tablet (10 mg total) by mouth daily.   Tyrvaya  0.03 MG/ACT Soln Generic drug: Varenicline  Tartrate Place 1 drop into both eyes 2 (two) times daily.   venlafaxine  XR 37.5 MG 24 hr capsule Commonly known as: EFFEXOR -XR Take 1 capsule (37.5 mg total) by mouth daily with breakfast.   VITAMIN B-12 PO Take 2.5 mg by mouth daily.   Vitamin D3 1.25 MG (50000 UT) Caps Take 1 capsule by mouth once a week.        Follow-up Information  Yolande Toribio MATSU, MD Follow up in 1 week(s).   Specialty: Internal Medicine Contact information: [address] [place] [address] [phone]                Allergies  Allergen Reactions   Ciprofloxacin Rash   Metronidazole Rash    Consultations: Cardiology   Procedures/Studies: CARDIAC CATHETERIZATION Result Date: 08/15/2024 Images from the original result were not included. 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 Consider anticoagulation for PAF/flutter Given troponin elevation on presentation, could consider Aspirin  81 mg daily for 1 year, but okay to omit should there be any concern for bleeding Newman JINNY Lawrence, MD  ECHOCARDIOGRAM COMPLETE Result Date: 08/13/2024    ECHOCARDIOGRAM REPORT   Patient Name:   Atalie Oros Date of Exam: 08/13/2024 Medical Rec #:  [id number]      Height:       65.0 in Accession  #:    [id number]     Weight:       150.5 lb Date of Birth:  Nov 25, 1945      BSA:          1.753 m Patient Age:    78 years       BP:           113/90 mmHg Patient Gender: F              HR:           91 bpm. Exam Location:  Inpatient Procedure: 2D Echo, Cardiac Doppler and Color Doppler (Both Spectral and Color            Flow Doppler were utilized during procedure). Indications:    Chest Pain R07.9  History:        Patient has no prior history of Echocardiogram examinations.                 CAD, PAD, Signs/Symptoms:Chest Pain; Risk Factors:Hypertension,                 Sleep Apnea and Dyslipidemia.  Sonographer:    Thea Norlander RCS Referring Phys: BRADLY MARLA DRONES IMPRESSIONS  1. Akinesis of the distal septum and hypokinesis of the apex with overall mild to moderate LV dysfunction.  2. Left ventricular ejection fraction, by estimation, is 40 to 45%. The left ventricle has mildly decreased function. The left ventricle demonstrates regional wall motion abnormalities (see scoring diagram/findings for description). There is mild left ventricular hypertrophy. Left ventricular diastolic parameters are consistent with Grade I diastolic dysfunction (impaired relaxation).  3. Right ventricular systolic function is normal. The right ventricular size is normal.  4. The mitral valve is normal in structure. No evidence of mitral valve regurgitation. No evidence of mitral stenosis.  5. The aortic valve is tricuspid. Aortic valve regurgitation is not visualized. No aortic stenosis is present.  6. The inferior vena cava is normal in size with greater than 50% respiratory variability, suggesting right atrial pressure of 3 mmHg. Comparison(s): No prior Echocardiogram. FINDINGS  Left Ventricle: Left ventricular ejection fraction, by estimation, is 40 to 45%. The left ventricle has mildly decreased function. The left ventricle demonstrates regional wall motion abnormalities. The left ventricular internal cavity size was normal in  size. There is mild  left ventricular hypertrophy. Left ventricular diastolic parameters are consistent with Grade I diastolic dysfunction (impaired relaxation). Right Ventricle: The right ventricular size is normal. Right ventricular systolic function is normal. Left Atrium: Left atrial size was normal in size. Right Atrium: Right atrial size was normal in size. Pericardium: Trivial pericardial effusion is present. Mitral Valve: The mitral valve is normal in structure. Mild mitral annular calcification. No evidence of mitral valve regurgitation. No evidence of mitral valve stenosis. Tricuspid Valve: The tricuspid valve is normal in structure. Tricuspid valve regurgitation is trivial. No evidence of tricuspid stenosis. Aortic Valve: The aortic valve is tricuspid. Aortic valve regurgitation is not visualized. No aortic stenosis is present. Aortic valve peak gradient measures 7.4 mmHg. Pulmonic Valve: The pulmonic valve was not well visualized. Pulmonic valve regurgitation is trivial. No evidence of pulmonic stenosis. Aorta: The aortic root is normal in size and structure. Venous: The inferior vena cava is normal in size with greater than 50% respiratory variability, suggesting right atrial pressure of 3 mmHg. IAS/Shunts: No atrial level shunt detected by color flow Doppler. Additional Comments: Akinesis of the distal septum and hypokinesis of the apex with overall mild to moderate LV dysfunction.  LEFT VENTRICLE PLAX 2D LVIDd:         4.80 cm   Diastology LVIDs:         3.40 cm   LV e' medial:    5.00 cm/s LV PW:         1.20 cm   LV E/e' medial:  11.7 LV IVS:        1.00 cm   LV e' lateral:   7.07 cm/s LVOT diam:     2.00 cm   LV E/e' lateral: 8.2 LV SV:         40 LV SV Index:   23 LVOT Area:     3.14 cm  RIGHT VENTRICLE            IVC RV S prime:     8.59 cm/s  IVC diam: 1.50 cm TAPSE (M-mode): 1.9 cm LEFT ATRIUM             Index        RIGHT ATRIUM           Index LA diam:        3.80 cm 2.17 cm/m   RA Area:      10.90 cm LA Vol (A2C):   36.6 ml 20.88 ml/m  RA Volume:   21.10 ml  12.04 ml/m LA Vol (A4C):   30.4 ml 17.34 ml/m LA Biplane Vol: 33.7 ml 19.22 ml/m  AORTIC VALVE AV Area (Vmax): 1.73 cm AV Vmax:        136.00 cm/s AV Peak Grad:   7.4 mmHg LVOT Vmax:      74.70 cm/s LVOT Vmean:     49.700 cm/s LVOT VTI:       0.128 m  AORTA Ao Root diam: 3.30 cm Ao Asc diam:  3.50 cm MITRAL VALVE               TRICUSPID VALVE MV Area (PHT): 4.10 cm    TR Peak grad:   6.6 mmHg MV Decel Time: 185 msec    TR Vmax:        128.00 cm/s MV E velocity: 58.30 cm/s MV A velocity: 74.70 cm/s  SHUNTS MV E/A ratio:  0.78        Systemic VTI:  0.13 m  Systemic Diam: 2.00 cm Redell Shallow MD Electronically signed by Redell Shallow MD Signature Date/Time: 08/13/2024/11:40:34 AM    Final    CT Angio Chest PE W and/or Wo Contrast Result Date: 08/12/2024 CLINICAL DATA:  Nausea/vomiting/diarrhea, hot flashes and chest pressure with back pain since yesterday EXAM: CT ANGIOGRAPHY CHEST CT ABDOMEN AND PELVIS WITH CONTRAST TECHNIQUE: Multidetector CT imaging of the chest was performed using the standard protocol during bolus administration of intravenous contrast. Multiplanar CT image reconstructions and MIPs were obtained to evaluate the vascular anatomy. Multidetector CT imaging of the abdomen and pelvis was performed using the standard protocol during bolus administration of intravenous contrast. RADIATION DOSE REDUCTION: This exam was performed according to the departmental dose-optimization program which includes automated exposure control, adjustment of the mA and/or kV according to patient size and/or use of iterative reconstruction technique. CONTRAST:  90mL OMNIPAQUE  IOHEXOL  350 MG/ML SOLN COMPARISON:  08/12/2024, 06/23/2024 FINDINGS: CTA CHEST FINDINGS Cardiovascular: This is a technically adequate evaluation of the pulmonary vasculature. No filling defects or pulmonary emboli. Dilated main pulmonary artery  measuring 3.4 cm compatible with pulmonary arterial hypertension. Normal caliber of the thoracic aorta. Assessment of the aortic lumen is limited due to timing of contrast bolus. Stable atherosclerosis throughout the aorta and coronary vasculature. Mediastinum/Nodes: No enlarged mediastinal, hilar, or axillary lymph nodes. Thyroid  gland, trachea, and esophagus demonstrate no significant findings. Lungs/Pleura: No acute airspace disease, effusion, or pneumothorax. The prominent area of fat along the superior extent of the left major fissure, reference image 45/2, is unchanged since prior exams. Central airways are patent. Musculoskeletal: No acute or destructive bony abnormalities. Reconstructed images demonstrate no additional findings. Review of the MIP images confirms the above findings. CT ABDOMEN and PELVIS FINDINGS Hepatobiliary: Large calcified gallstone again noted, with no evidence of acute cholecystitis. The liver is unremarkable. No biliary duct dilation. Pancreas: Unremarkable. No pancreatic ductal dilatation or surrounding inflammatory changes. Spleen: Normal in size without focal abnormality. Adrenals/Urinary Tract: Stable areas of cortical scarring within the upper poles of the kidneys, right greater than left. Otherwise the kidneys enhance normally and symmetrically. No urinary tract calculi or obstructive uropathy. The adrenals and bladder are unremarkable. Stomach/Bowel: No bowel obstruction or ileus. Normal appendix right lower quadrant. There is marked diverticulosis of the descending and sigmoid colon without evidence of acute diverticulitis. No bowel wall thickening or inflammatory change. Vascular/Lymphatic: Aortic atherosclerosis. No enlarged abdominal or pelvic lymph nodes. Reproductive: Uterus and bilateral adnexa are unremarkable. Other: No free fluid or free intraperitoneal gas. No abdominal wall hernia. Musculoskeletal: No acute or destructive bony abnormalities. Reconstructed images  demonstrate no additional findings. Review of the MIP images confirms the above findings. IMPRESSION: Chest: 1. No evidence of pulmonary embolus. 2. No acute intrathoracic process. 3. Dilated main pulmonary artery compatible with pulmonary arterial hypertension. 4. Aortic Atherosclerosis (ICD10-I70.0). Coronary artery atherosclerosis. Abdomen/pelvis: 1. Cholelithiasis without cholecystitis. 2. Distal colonic diverticulosis without diverticulitis. 3.  Aortic Atherosclerosis (ICD10-I70.0). Electronically Signed   By: Ozell Daring M.D.   On: 08/12/2024 15:24   CT ABDOMEN PELVIS W CONTRAST Result Date: 08/12/2024 CLINICAL DATA:  Nausea/vomiting/diarrhea, hot flashes and chest pressure with back pain since yesterday EXAM: CT ANGIOGRAPHY CHEST CT ABDOMEN AND PELVIS WITH CONTRAST TECHNIQUE: Multidetector CT imaging of the chest was performed using the standard protocol during bolus administration of intravenous contrast. Multiplanar CT image reconstructions and MIPs were obtained to evaluate the vascular anatomy. Multidetector CT imaging of the abdomen and pelvis was performed using the standard protocol during bolus administration  of intravenous contrast. RADIATION DOSE REDUCTION: This exam was performed according to the departmental dose-optimization program which includes automated exposure control, adjustment of the mA and/or kV according to patient size and/or use of iterative reconstruction technique. CONTRAST:  90mL OMNIPAQUE  IOHEXOL  350 MG/ML SOLN COMPARISON:  08/12/2024, 06/23/2024 FINDINGS: CTA CHEST FINDINGS Cardiovascular: This is a technically adequate evaluation of the pulmonary vasculature. No filling defects or pulmonary emboli. Dilated main pulmonary artery measuring 3.4 cm compatible with pulmonary arterial hypertension. Normal caliber of the thoracic aorta. Assessment of the aortic lumen is limited due to timing of contrast bolus. Stable atherosclerosis throughout the aorta and coronary  vasculature. Mediastinum/Nodes: No enlarged mediastinal, hilar, or axillary lymph nodes. Thyroid  gland, trachea, and esophagus demonstrate no significant findings. Lungs/Pleura: No acute airspace disease, effusion, or pneumothorax. The prominent area of fat along the superior extent of the left major fissure, reference image 45/2, is unchanged since prior exams. Central airways are patent. Musculoskeletal: No acute or destructive bony abnormalities. Reconstructed images demonstrate no additional findings. Review of the MIP images confirms the above findings. CT ABDOMEN and PELVIS FINDINGS Hepatobiliary: Large calcified gallstone again noted, with no evidence of acute cholecystitis. The liver is unremarkable. No biliary duct dilation. Pancreas: Unremarkable. No pancreatic ductal dilatation or surrounding inflammatory changes. Spleen: Normal in size without focal abnormality. Adrenals/Urinary Tract: Stable areas of cortical scarring within the upper poles of the kidneys, right greater than left. Otherwise the kidneys enhance normally and symmetrically. No urinary tract calculi or obstructive uropathy. The adrenals and bladder are unremarkable. Stomach/Bowel: No bowel obstruction or ileus. Normal appendix right lower quadrant. There is marked diverticulosis of the descending and sigmoid colon without evidence of acute diverticulitis. No bowel wall thickening or inflammatory change. Vascular/Lymphatic: Aortic atherosclerosis. No enlarged abdominal or pelvic lymph nodes. Reproductive: Uterus and bilateral adnexa are unremarkable. Other: No free fluid or free intraperitoneal gas. No abdominal wall hernia. Musculoskeletal: No acute or destructive bony abnormalities. Reconstructed images demonstrate no additional findings. Review of the MIP images confirms the above findings. IMPRESSION: Chest: 1. No evidence of pulmonary embolus. 2. No acute intrathoracic process. 3. Dilated main pulmonary artery compatible with pulmonary  arterial hypertension. 4. Aortic Atherosclerosis (ICD10-I70.0). Coronary artery atherosclerosis. Abdomen/pelvis: 1. Cholelithiasis without cholecystitis. 2. Distal colonic diverticulosis without diverticulitis. 3.  Aortic Atherosclerosis (ICD10-I70.0). Electronically Signed   By: Ozell Daring M.D.   On: 08/12/2024 15:24   DG Chest Port 1 View Result Date: 08/12/2024 EXAM: 1 VIEW(S) XRAY OF THE CHEST 08/12/2024 01:06:12 PM COMPARISON: 02/20/2022 CLINICAL HISTORY: chest pain FINDINGS: LUNGS AND PLEURA: Scarring at left costophrenic angle. No focal pulmonary opacity. No pleural effusion. No pneumothorax. HEART AND MEDIASTINUM: Aortic atherosclerosis. No acute abnormality of the cardiac silhouette. BONES AND SOFT TISSUES: No acute osseous abnormality. IMPRESSION: 1. No acute cardiopulmonary process. 2. Aortic atherosclerosis. 3. Scarring at the left costophrenic angle. Electronically signed by: Waddell Calk MD 08/12/2024 01:53 PM EST RP Workstation: [id number]   MR BRAIN W WO CONTRAST Result Date: 07/21/2024 EXAM: MRI BRAIN WITH AND WITHOUT CONTRAST 07/21/2024 05:00:30 PM TECHNIQUE: Multiplanar multisequence MRI of the head/brain was performed with and without the administration of 6 mL gadobutrol  (GADAVIST ) 1 MMOL/ML injection. COMPARISON: None available. CLINICAL HISTORY: Headache, new onset (Age >= 51y). FINDINGS: BRAIN AND VENTRICLES: No acute infarct. No acute intracranial hemorrhage. No mass effect or midline shift. No hydrocephalus. The sella is unremarkable. Normal flow voids. No mass or abnormal enhancement. ORBITS: No acute abnormality. SINUSES: No acute abnormality. BONES AND SOFT TISSUES: Normal bone  marrow signal and enhancement. Bilateral mastoid fluid. Nasopharynx is clear. IMPRESSION: 1. No acute intracranial abnormality. Electronically signed by: Franky Stanford MD 07/21/2024 05:13 PM EDT RP Workstation: [id number]     Discharge Exam: Vitals:   08/16/24 0748 08/16/24 0832  BP: (!) 142/66  (!) 152/71  Pulse: 80 90  Resp: 20 18  Temp: 98.3 F (36.8 C) 98.1 F (36.7 C)  SpO2: 98% 98%   Vitals:   08/16/24 0022 08/16/24 0432 08/16/24 0748 08/16/24 0832  BP: 122/74 130/74 (!) 142/66 (!) 152/71  Pulse: 63 73 80 90  Resp: 18 18 20 18   Temp: 98 F (36.7 C) 98.3 F (36.8 C) 98.3 F (36.8 C) 98.1 F (36.7 C)  TempSrc: Oral Oral Oral Oral  SpO2: 99% 97% 98% 98%  Weight:      Height:        General: Pt is alert, awake, not in acute distress Cardiovascular: RRR, S1/S2 +, no rubs, no gallops Respiratory: CTA bilaterally, no wheezing, no rhonchi Abdominal: Soft, NT, ND, bowel sounds + Extremities: no edema, no cyanosis    The results of significant diagnostics from this hospitalization (including imaging, microbiology, ancillary and laboratory) are listed below for reference.     Microbiology: Recent Results (from the past 240 hours)  Resp panel by RT-PCR (RSV, Flu A&B, Covid)     Status: None   Collection Time: 08/12/24  5:13 PM   Specimen: Nasal Swab  Result Value Ref Range Status   SARS Coronavirus 2 by RT PCR NEGATIVE NEGATIVE Final    Comment: (NOTE) SARS-CoV-2 target nucleic acids are NOT DETECTED.  The SARS-CoV-2 RNA is generally detectable in upper respiratory specimens during the acute phase of infection. The lowest concentration of SARS-CoV-2 viral copies this assay can detect is 138 copies/mL. A negative result does not preclude SARS-Cov-2 infection and should not be used as the sole basis for treatment or other patient management decisions. A negative result may occur with  improper specimen collection/handling, submission of specimen other than nasopharyngeal swab, presence of viral mutation(s) within the areas targeted by this assay, and inadequate number of viral copies(<138 copies/mL). A negative result must be combined with clinical observations, patient history, and epidemiological information. The expected result is Negative.  Fact Sheet  for Patients:  bloggercourse.com  Fact Sheet for Healthcare Providers:  seriousbroker.it  This test is no t yet approved or cleared by the [place]  FDA and  has been authorized for detection and/or diagnosis of SARS-CoV-2 by FDA under an Emergency Use Authorization (EUA). This EUA will remain  in effect (meaning this test can be used) for the duration of the COVID-19 declaration under Section 564(b)(1) of the Act, 21 U.S.C.section 360bbb-3(b)(1), unless the authorization is terminated  or revoked sooner.       Influenza A by PCR NEGATIVE NEGATIVE Final   Influenza B by PCR NEGATIVE NEGATIVE Final    Comment: (NOTE) The Xpert Xpress SARS-CoV-2/FLU/RSV plus assay is intended as an aid in the diagnosis of influenza from Nasopharyngeal swab specimens and should not be used as a sole basis for treatment. Nasal washings and aspirates are unacceptable for Xpert Xpress SARS-CoV-2/FLU/RSV testing.  Fact Sheet for Patients: bloggercourse.com  Fact Sheet for Healthcare Providers: seriousbroker.it  This test is not yet approved or cleared by the [place]  FDA and has been authorized for detection and/or diagnosis of SARS-CoV-2 by FDA under an Emergency Use Authorization (EUA). This EUA will remain in effect (meaning this test can be used) for  the duration of the COVID-19 declaration under Section 564(b)(1) of the Act, 21 U.S.C. section 360bbb-3(b)(1), unless the authorization is terminated or revoked.     Resp Syncytial Virus by PCR NEGATIVE NEGATIVE Final    Comment: (NOTE) Fact Sheet for Patients: bloggercourse.com  Fact Sheet for Healthcare Providers: seriousbroker.it  This test is not yet approved or cleared by the [place]  FDA and has been authorized for detection and/or diagnosis of SARS-CoV-2 by FDA under an  Emergency Use Authorization (EUA). This EUA will remain in effect (meaning this test can be used) for the duration of the COVID-19 declaration under Section 564(b)(1) of the Act, 21 U.S.C. section 360bbb-3(b)(1), unless the authorization is terminated or revoked.  Performed at [facility], [address] [place], [place] [address]   Culture, blood (routine x 2)     Status: None (Preliminary result)   Collection Time: 08/12/24  5:30 PM   Specimen: BLOOD  Result Value Ref Range Status   Specimen Description   Final    BLOOD BLOOD RIGHT WRIST Performed at [facility], [address], [place], [place] [address]    Special Requests   Final    Blood Culture adequate volume Performed at [facility], [address], [place], [place] [address]    Culture   Final    NO GROWTH 4 DAYS Performed at [facility], [address], [place], [place] [address]    Report Status PENDING  Incomplete  Culture, blood (routine x 2)     Status: None (Preliminary result)   Collection Time: 08/12/24  5:45 PM   Specimen: BLOOD  Result Value Ref Range Status   Specimen Description   Final    BLOOD LEFT ANTECUBITAL Performed at [facility], [address], [place], [place] [address]    Special Requests   Final    Blood Culture adequate volume Performed at [facility], [address] [place], [place] [address]    Culture   Final    NO GROWTH 4 DAYS Performed at [facility], [address], [place], [place] [address]    Report Status PENDING  Incomplete     Labs: BNP (last 3 results) No results for input(s): BNP in the last 8760 hours. Basic Metabolic Panel: Recent Labs  Lab 08/12/24 1209 08/13/24 0226 08/13/24 0656 08/14/24 0819 08/16/24 0531  NA 138  --  136 142 139  K 3.6  --  3.7 3.7 3.3*  CL 95*  --  102 104 107  CO2 23  --  20* 24 21*  GLUCOSE 172*  --  120* 118*  99  BUN 24*  --  11 13 16   CREATININE 0.83 0.76 0.90 0.90 0.79  CALCIUM  10.4*  --  8.6* 8.4* 8.3*  MG  --   --  1.9  --   --    Liver Function Tests: Recent Labs  Lab 08/12/24 1332  AST 33  ALT 10  ALKPHOS 58  BILITOT 0.8  PROT 7.5  ALBUMIN 4.7   Recent Labs  Lab 08/12/24 1332  LIPASE 22   No results for input(s): AMMONIA in the last 168 hours. CBC: Recent Labs  Lab 08/12/24 1209 08/13/24 0226 08/14/24 0819 08/15/24 0359 08/16/24 0531  WBC 21.6* 16.7* 10.1 13.1* 10.9*  NEUTROABS  --   --  6.8  --   --   HGB 14.2 13.6 13.2 12.7 13.1  HCT 42.4 40.8 40.2 38.2 39.3  MCV 97.7 95.8 97.6 97.7 97.5  PLT 304 226 205 202 195   Cardiac Enzymes: Recent Labs  Lab 08/13/24 0656  CKTOTAL 343*   BNP: Invalid input(s): POCBNP CBG: No results for input(s): GLUCAP in the last 168 hours. D-Dimer No results for input(s): DDIMER in the last 72 hours. Hgb A1c No results for input(s): HGBA1C in the last 72 hours. Lipid Profile No results for input(s): CHOL, HDL, LDLCALC, TRIG, CHOLHDL, LDLDIRECT in the last 72 hours. Thyroid  function studies Recent Labs    08/13/24 1041  TSH 5.146*   Anemia work up No results for input(s): VITAMINB12, FOLATE, FERRITIN, TIBC, IRON, RETICCTPCT in the last 72 hours. Urinalysis    Component Value Date/Time   COLORURINE COLORLESS (A) 08/12/2024 1655   APPEARANCEUR CLEAR 08/12/2024 1655   LABSPEC 1.026 08/12/2024 1655   PHURINE 7.5 08/12/2024 1655   GLUCOSEU NEGATIVE 08/12/2024 1655   HGBUR TRACE (A) 08/12/2024 1655   BILIRUBINUR NEGATIVE 08/12/2024 1655   KETONESUR 15 (A) 08/12/2024 1655   PROTEINUR NEGATIVE 08/12/2024 1655   NITRITE NEGATIVE 08/12/2024 1655   LEUKOCYTESUR NEGATIVE 08/12/2024 1655   Sepsis Labs Recent Labs  Lab 08/13/24 0226 08/14/24 0819 08/15/24 0359 08/16/24 0531  WBC 16.7* 10.1 13.1* 10.9*   Microbiology Recent Results (from the past 240 hours)  Resp panel by RT-PCR  (RSV, Flu A&B, Covid)     Status: None   Collection Time: 08/12/24  5:13 PM   Specimen: Nasal Swab  Result Value Ref Range Status   SARS Coronavirus 2 by RT PCR NEGATIVE NEGATIVE Final    Comment: (NOTE) SARS-CoV-2 target nucleic acids are NOT DETECTED.  The SARS-CoV-2 RNA is generally detectable in upper respiratory specimens during the acute phase of infection. The lowest concentration of SARS-CoV-2 viral copies this assay can detect is 138 copies/mL. A negative result does not preclude SARS-Cov-2 infection and should not be used as the sole basis for treatment or other patient management decisions. A negative result may occur with  improper specimen collection/handling, submission of specimen other than nasopharyngeal swab, presence of viral mutation(s) within the areas targeted by this assay, and inadequate number of viral copies(<138 copies/mL). A negative result must be combined with clinical observations, patient history, and epidemiological information. The expected result is Negative.  Fact Sheet for Patients:  bloggercourse.com  Fact Sheet for Healthcare Providers:  seriousbroker.it  This test is no t yet approved or cleared by the [place]  FDA and  has been authorized for detection and/or diagnosis of SARS-CoV-2 by FDA under an Emergency Use Authorization (EUA). This EUA will remain  in effect (meaning this test can be used) for the duration of the COVID-19 declaration under Section 564(b)(1) of the Act, 21 U.S.C.section 360bbb-3(b)(1), unless the authorization is terminated  or revoked sooner.       Influenza A by PCR NEGATIVE NEGATIVE Final   Influenza B by PCR NEGATIVE NEGATIVE Final    Comment: (NOTE) The Xpert Xpress SARS-CoV-2/FLU/RSV plus assay is intended as an aid in the diagnosis of influenza from Nasopharyngeal swab specimens and should not be used as a sole basis for treatment. Nasal washings  and aspirates are unacceptable for Xpert Xpress SARS-CoV-2/FLU/RSV testing.  Fact Sheet for Patients: bloggercourse.com  Fact Sheet for Healthcare Providers: seriousbroker.it  This test is not yet approved or cleared by the [place]  FDA and has been authorized for detection and/or diagnosis of SARS-CoV-2 by FDA under an Emergency Use Authorization (EUA). This EUA will remain in effect (meaning  this test can be used) for the duration of the COVID-19 declaration under Section 564(b)(1) of the Act, 21 U.S.C. section 360bbb-3(b)(1), unless the authorization is terminated or revoked.     Resp Syncytial Virus by PCR NEGATIVE NEGATIVE Final    Comment: (NOTE) Fact Sheet for Patients: bloggercourse.com  Fact Sheet for Healthcare Providers: seriousbroker.it  This test is not yet approved or cleared by the [place]  FDA and has been authorized for detection and/or diagnosis of SARS-CoV-2 by FDA under an Emergency Use Authorization (EUA). This EUA will remain in effect (meaning this test can be used) for the duration of the COVID-19 declaration under Section 564(b)(1) of the Act, 21 U.S.C. section 360bbb-3(b)(1), unless the authorization is terminated or revoked.  Performed at [facility], [address] [place], [place] [address]   Culture, blood (routine x 2)     Status: None (Preliminary result)   Collection Time: 08/12/24  5:30 PM   Specimen: BLOOD  Result Value Ref Range Status   Specimen Description   Final    BLOOD BLOOD RIGHT WRIST Performed at [facility], [address], [place], [place] [address]    Special Requests   Final    Blood Culture adequate volume Performed at [facility], [address], [place], [place] [address]    Culture   Final    NO GROWTH 4 DAYS Performed at [facility], [address], [place], [place] [address]    Report Status PENDING  Incomplete  Culture, blood (routine x 2)     Status: None (Preliminary result)   Collection Time: 08/12/24  5:45 PM   Specimen: BLOOD  Result Value Ref Range Status   Specimen Description   Final    BLOOD LEFT ANTECUBITAL Performed at [facility], [address] [place], [place] [address]    Special Requests   Final    Blood Culture adequate volume Performed at [facility], [address], [place], [place] [address]    Culture   Final    NO GROWTH 4 DAYS Performed at [facility], [address], [place], [place] [address]    Report Status PENDING  Incomplete    FURTHER DISCHARGE INSTRUCTIONS:   Get Medicines reviewed and adjusted: Please take all your medications with you for your next visit with your Primary MD   Laboratory/radiological data: Please request your Primary MD to go over all hospital tests and procedure/radiological results at the follow up, please ask your Primary MD to get all Hospital records sent to his/her office.   In some cases, they will be blood work, cultures and biopsy results pending at the time of your discharge. Please request that your primary care M.D. goes through all the records of your hospital data and follows up on these results.   Also Note the following: If you experience worsening of your admission symptoms, develop shortness of breath, life threatening emergency, suicidal or homicidal thoughts you must seek medical attention immediately by calling 911 or calling your MD immediately  if symptoms less severe.   You must read complete instructions/literature along with all the possible adverse reactions/side effects for all the Medicines you take and that have been prescribed to you. Take any new Medicines after you have completely understood and accpet all the possible adverse reactions/side effects.    patient was instructed,  not to drive, operate heavy machinery, perform activities at heights, swimming or participation in water  activities or  provide baby-sitting services while on Pain, Sleep and Anxiety Medications; until their outpatient Physician has advised to do so again. Also recommended to not to take more than prescribed Pain, Sleep and Anxiety Medications.  It is not advisable to combine anxiety, sleep and pain medications without talking with your primary care provider.     Wear Seat belts while driving.   Please note: You were cared for by a hospitalist during your hospital stay. Once you are discharged, your primary care physician will handle any further medical issues. Please note that NO REFILLS for any discharge medications will be authorized once you are discharged, as it is imperative that you return to your primary care physician (or establish a relationship with a primary care physician if you do not have one) for your post hospital discharge needs so that they can reassess your need for medications and monitor your lab values  Time coordinating discharge: Over 30 minutes  SIGNED:   Fredia Skeeter, MD  [facility] 08/16/2024, 10:14 AM *Please note that this is a verbal dictation therefore any spelling or grammatical errors are due to the Dragon Medical One system interpretation. If 7PM-7AM, please contact night-coverage [email or web address]

## 2024-08-16 NOTE — Progress Notes (Signed)
  Progress Note  Patient Name: Loretta Austin Date of Encounter: 08/16/2024 [facility] Cardiologist: Soyla DELENA Merck, MD   Interval Summary   Resting comfortably in bed Reports no symptoms  Ambulating well  Eager to discharge   Vital Signs Vitals:   08/15/24 1931 08/16/24 0022 08/16/24 0432 08/16/24 0748  BP: 136/72 122/74 130/74 (!) 142/66  Pulse: 77 63 73 80  Resp: 16 18 18 20   Temp: 98.1 F (36.7 C) 98 F (36.7 C) 98.3 F (36.8 C) 98.3 F (36.8 C)  TempSrc: Oral Oral Oral Oral  SpO2: 98% 99% 97% 98%  Weight:      Height:        Intake/Output Summary (Last 24 hours) at 08/16/2024 0818 Last data filed at 08/16/2024 0749 Gross per 24 hour  Intake 1010 ml  Output --  Net 1010 ml      08/12/2024   10:28 PM 08/12/2024   12:06 PM 07/19/2024    2:44 PM  Last 3 Weights  Weight (lbs) 150 lb 8 oz 149 lb 154 lb 1.6 oz  Weight (kg) 68.266 kg 67.586 kg 69.899 kg      Telemetry/ECG  Sinus rhythm, HR 80s - Personally Reviewed  Physical Exam  GEN: No acute distress.   Neck: No JVD Cardiac: RRR, no murmurs, rubs, or gallops.  Respiratory: Clear to auscultation bilaterally. GI: Soft, nontender, non-distended  MS: No edema  Assessment & Plan   Nonobstructive CAD Presented with N/V, fatigue  Troponin 365 ? 676 ? 2,530 EKG with new TWI leads V1-V2 CT chest showed aortic atherosclerosis Echo showed LVEF 40-45% with RWMA LHC showed nonobstructive CAD Discontinuing ASA given Eliquis  use  Continue statin as below Continue Toprol  25 mg BID     Cardiomyopathy, new Echo showed LVEF 40-45% with RWMA LHC showed nonobstructive CAD Continue Toprol  25 mg BID, can reduce to daily if she feels fatigued Adding Losartan  25 mg daily today ? may transition to Entresto as outpatient  Consider adding spironolactone at outpatient follow up Defer SGLT2i use with frequent UTIs   Paroxysmal atrial flutter  No prior known history  Occurs when she has severe hot  flashes/diaphoresis prior to admission  Converted to NSR Remains in sinus rhythm Continue Toprol  25 mg BID Continue Eliquis  5 mg BID   Hyperlipidemia LDL 11/2022 noted to be 65  Pending LPa Continue Crestor  10 mg daily   Follow up: scheduled with our [place] office 12/11 at 2:45pm   For questions or updates, please contact [facility] Please consult [email or web address] for contact info under      Signed, Waddell DELENA Donath, PA-C

## 2024-08-16 NOTE — Evaluation (Signed)
 Occupational Therapy Evaluation Patient Details Name: Loretta Austin MRN: [id number] DOB: August 17, 1946 Today's Date: 08/16/2024   History of Present Illness   78 yo presents to the hospital with complaint of chest pain, n/v and hot flashes. Pt with NSTEMI, acute CHF. EFY:[id number], CAD, PAD, obstructive sleep apnea, collagenous colitis, rheumatoid arthritis and hyperlipidemia     Clinical Impressions PTA pt lives alone independently. Pt close to baseline regarding mobility and ADL tasks. Able to ambulate > 200 ft independently. Educated pt on activity modification and strategies to reduce risk of falls. VSS throughout on RA without SOB. Pt has good family/friend to DC home safely. Discussed having her daughter S showers and stay the night initially if able  - pt verbalized understanding. Pt has longstanding issues with R hip/buttock discomfort - would benefit from outpt PT consult after DC - MD made aware. OT signing off.      If plan is discharge home, recommend the following:   Assist for transportation     Functional Status Assessment   Patient has not had a recent decline in their functional status     Equipment Recommendations   None recommended by OT     Recommendations for Other Services         Precautions/Restrictions   Precautions Precautions: None     Mobility Bed Mobility Overal bed mobility: Independent                  Transfers Overall transfer level: Independent                        Balance Overall balance assessment: Modified Independent                               Standardized Balance Assessment Standardized Balance Assessment : Dynamic Gait Index   Dynamic Gait Index Level Surface: Normal Change in Gait Speed: Normal Gait with Horizontal Head Turns: Normal Gait with Vertical Head Turns: Normal Gait and Pivot Turn: Normal Step Over Obstacle: Normal Step Around Obstacles: Normal Steps:  Normal Total Score: 24     ADL either performed or assessed with clinical judgement   ADL Overall ADL's : At baseline                                       General ADL Comments: educated on energy conservation and strategies to reduce risk of falls     Vision Baseline Vision/History: 1 Wears glasses (dry eyes) Vision Assessment?: No apparent visual deficits     Perception         Praxis         Pertinent Vitals/Pain Pain Assessment Pain Assessment: No/denies pain     Extremity/Trunk Assessment Upper Extremity Assessment Upper Extremity Assessment: Right hand dominant   Lower Extremity Assessment Lower Extremity Assessment: RLE deficits/detail RLE Deficits / Details: R hip has bothered her for 20 years - has never been to PT   Cervical / Trunk Assessment Cervical / Trunk Assessment: Normal   Communication Communication Communication: No apparent difficulties   Cognition Arousal: Alert Behavior During Therapy: WFL for tasks assessed/performed Cognition: No apparent impairments                               Following commands: Intact  Cueing  General Comments      reviewed minimal use of RUE after LHC - pt verbalized understanding   Exercises     Shoulder Instructions      Home Living Family/patient expects to be discharged to:: Private residence Living Arrangements: Alone;Children Available Help at Discharge: Friend(s);Family;Available 24 hours/day Type of Home: House Home Access: Stairs to enter [employer] of Steps: 2 Entrance Stairs-Rails: Left Home Layout: One level     Bathroom Shower/Tub: Producer, Television/film/video: Handicapped height Bathroom Accessibility: Yes How Accessible: Accessible via walker Home Equipment: Grab bars - toilet;Grab bars - tub/shower;Shower seat          Prior Functioning/Environment Prior Level of Function : Independent/Modified Independent;Driving                ADLs Comments: Typically works out at [employer] at [employer] but hasn't been for 2 months becuase she hasn't felt good. General fatigue for the past couple of months.    OT Problem List: Decreased activity tolerance   OT Treatment/Interventions:        OT Goals(Current goals can be found in the care plan section)   Acute Rehab OT Goals Patient Stated Goal: home today OT Goal Formulation: All assessment and education complete, DC therapy   OT Frequency:       Co-evaluation              AM-PAC OT 6 Clicks Daily Activity     Outcome Measure Help from another person eating meals?: None Help from another person taking care of personal grooming?: None Help from another person toileting, which includes using toliet, bedpan, or urinal?: None Help from another person bathing (including washing, rinsing, drying)?: None Help from another person to put on and taking off regular upper body clothing?: None Help from another person to put on and taking off regular lower body clothing?: None 6 Click Score: 24   End of Session Equipment Utilized During Treatment: Gait belt Nurse Communication: Mobility status  Activity Tolerance: Patient tolerated treatment well Patient left: in chair;with call bell/phone within reach  OT Visit Diagnosis: Muscle weakness (generalized) (M62.81)                Time: 9050-8992 OT Time Calculation (min): 18 min Charges:  OT General Charges $OT Visit: 1 Visit OT Evaluation $OT Eval Low Complexity: 1 Low  Estefan Pattison, OT/L   Acute OT Clinical Specialist Acute Rehabilitation Services Pager [phone] Office [phone]   [facility] 08/16/2024, 10:16 AM

## 2024-08-16 NOTE — Progress Notes (Signed)
 CARDIAC REHAB PHASE I     Post MI education including restrictions, risk factors, exercise guidelines, MI booklet, NTG use, heart healthy diet and CRP2 reviewed. All questions and concerns addressed. Will refer to [facility] for CRP2. Plan for discharge home later today.   [id number] Vaughn Asberry Hacking, RN BSN 08/16/2024 10:52 AM

## 2024-08-16 NOTE — Progress Notes (Signed)
   Heart Failure Stewardship Pharmacist Progress Note   PCP: Yolande Toribio MATSU, MD PCP-Cardiologist: Soyla DELENA Merck, MD    HPI:  78 yo M with PMH of HTN, CAD, PAD, OSA, collagenous colitis, RA, and HLD.   Presented to the ED on 11/21 with chest pressure, N/V/D, and hot flashes. No ischemic changes on EKG. Troponin [phone]. CXR with no acute pulmonary process. Developed aflutter RVR, given diltiazem , and cardiology consulted. ECHO 11/22 with LVEF 40-45%, RWMA, mild LVH, G1DD, RV normal. Diltiazem  discontinued. Underwent LHC on 11/24 and found to have nonobstructive CAD.   Denies chest pain, shortness of breath, palpitations, lightheadedness, or dizziness. Reviewed GDMT and goals of therapy. Patient states the only medications she doesn't want to take that are listed on discharge list are melatonin and Effexor .   Current HF Medications: Beta Blocker: metoprolol  XL 25 mg BID ACE/ARB/ARNI: losartan  25 mg daily  Prior to admission HF Medications: ACE/ARB/ARNI: irbesartan 150 mg daily  Pertinent Lab Values: Serum creatinine 0.79, BUN 16, Potassium 3.3, Sodium 139, Magnesium 1.9  Vital Signs: Weight: 150 lbs Blood pressure: 130/70s  Heart rate: 60-80s  I/O: incomplete  Medication Assistance / Insurance Benefits Check: Does the patient have prescription insurance?  Yes Type of insurance plan: [employer] Medicare  Outpatient Pharmacy:  Prior to admission outpatient pharmacy: [facility] Is the patient willing to use [facility] pharmacy at discharge? Yes Is the patient willing to transition their outpatient pharmacy to utilize a [facility] outpatient pharmacy?   No    Assessment: 1. Acute systolic CHF (LVEF 40-45%), due to NICM. NYHA class II symptoms. - Does not appear volume overloaded on exam. LVEDP 21 on cath. KCl 40 mEq x2 given for replacement. - Agree with transitioning to metoprolol  XL 25 mg BID - keeping as BID in case she has fatigue after discharged, but can transition to  once daily if tolerated.  - Continue irbesartan 150 mg daily at discharge (transitioned to formulary alternative while inpatient) - pending BP, consider optimizing to Entresto at follow up - Consider starting spironolactone 12.5 mg daily - No SGLT2i with history of UTI - patient reported as frequent   Plan: 1) Medication changes recommended at this time: - Add spironolactone 12.5 mg daily  2) Patient assistance: - Copays $0  3)  Education  - Patient has been educated on current HF medications and potential additions to HF medication regimen - Patient verbalizes understanding that over the next few months, these medication doses may change and more medications may be added to optimize HF regimen - Patient has been educated on basic disease state pathophysiology and goals of therapy   Duwaine Plant, PharmD, BCPS Heart Failure Stewardship Pharmacist Phone [phone]

## 2024-08-16 NOTE — Progress Notes (Signed)
 PT Cancellation Note  Patient Details Name: Somer Trotter MRN: [id number] DOB: [birth year]   Cancelled Treatment:    Reason Eval/Treat Not Completed: PT screened, no needs identified, will sign off (pt seen by OT, independent and no acute needs at this time. Will sign off)   Hadlee Burback B Traylen Eckels 08/16/2024, 10:10 AM Lenoard SQUIBB, PT Acute Rehabilitation Services Office: [phone]

## 2024-08-16 NOTE — Care Management Important Message (Signed)
 Important Message  Patient Details  Name: Loretta Austin MRN: [id number] Date of Birth: April 10, 1946   Important Message Given:  Yes - Medicare IM     Vonzell Arrie Sharps 08/16/2024, 9:14 AM

## 2024-08-16 NOTE — Discharge Instructions (Addendum)

## 2024-08-17 ENCOUNTER — Telehealth (HOSPITAL_COMMUNITY): Payer: Self-pay | Admitting: *Deleted

## 2024-08-17 LAB — CULTURE, BLOOD (ROUTINE X 2)
Culture: NO GROWTH
Culture: NO GROWTH
Special Requests: ADEQUATE
Special Requests: ADEQUATE

## 2024-08-17 LAB — LIPOPROTEIN A (LPA): Lipoprotein (a): 55.2 nmol/L — ABNORMAL HIGH (ref <75.0)

## 2024-08-17 NOTE — Telephone Encounter (Signed)
 Called to confirm/remind patient of their appointment at [facility] on    08/22/24:    Appointment:              [x] Confirmed             [] Left mess              [] No answer/No voice mail             [] Phone not in service   Patient reminded to bring all medications and/or complete list.   Confirmed patient has transportation. Gave directions, instructed to utilize valet parking.

## 2024-08-21 NOTE — Progress Notes (Signed)
 HEART & VASCULAR TRANSITION OF CARE CONSULT NOTE     Referring Physician: Yolande Toribio MATSU, MD   Chief Complaint:   HPI: Referred to clinic by *** for heart failure consultation.   Abbi Mancini is a 78 y.o. female   Cardiac Testing    Past Medical History:  Diagnosis Date   Arthritis    Cholecystolithiasis    Colon polyps    Diverticulosis    Food poisoning    GERD (gastroesophageal reflux disease)    Heart murmur    High blood pressure    Hyperlipidemia    Microscopic colitis    Osteoporosis    PAD (peripheral artery disease) 01/16/2023   Lower extremity arterial Dopplers 02/06/2023: Right-no stenosis, mild-moderate atherosclerosis throughout especially in CFA and calf vessels  ABIs 02/06/2023: Right 0.91, left 1.12   Sjogren's disease    Sleep apnea    wears appliance    Current Outpatient Medications  Medication Sig Dispense Refill   adalimumab (HUMIRA, 2 PEN,) 40 MG/0.4ML pen Inject 40 mg into the skin every 14 (fourteen) days. Saturday's     apixaban  (ELIQUIS ) 5 MG TABS tablet Take 1 tablet (5 mg total) by mouth 2 (two) times daily. 60 tablet 0   Bacillus Coagulans-Inulin (ALIGN PREBIOTIC-PROBIOTIC) 5-1.25 MG-GM CHEW Chew 1 Dose by mouth daily.     budesonide  (ENTOCORT EC ) 3 MG 24 hr capsule Take 3 capsules (9 mg total) by mouth daily. (Patient taking differently: Take 1 mg by mouth daily.) 90 capsule 3   Cholecalciferol (VITAMIN D3) 1.25 MG (50000 UT) CAPS Take 1 capsule by mouth once a week.     Cyanocobalamin  (VITAMIN B-12 PO) Take 2.5 mg by mouth daily.     irbesartan (AVAPRO) 150 MG tablet Take 150 mg by mouth daily.     Magnesium 400 MG TABS Take 1 tablet by mouth daily in the afternoon.     MELATONIN PO Take 10 mg by mouth at bedtime.     metoprolol  succinate (TOPROL -XL) 25 MG 24 hr tablet Take 1 tablet (25 mg total) by mouth 2 (two) times daily. 60 tablet 0   Multiple Vitamins-Iron (MULTIVITAMIN/IRON PO) Take 1 tablet by mouth daily.      RESTASIS  0.05 % ophthalmic emulsion Place 1 drop into both eyes 2 (two) times daily.     rosuvastatin  (CRESTOR ) 10 MG tablet Take 1 tablet (10 mg total) by mouth daily. 90 tablet 3   TYRVAYA  0.03 MG/ACT SOLN Place 1 drop into both eyes 2 (two) times daily.     venlafaxine  XR (EFFEXOR -XR) 37.5 MG 24 hr capsule Take 1 capsule (37.5 mg total) by mouth daily with breakfast. 90 capsule 3   No current facility-administered medications for this visit.    Allergies  Allergen Reactions   Ciprofloxacin Rash   Metronidazole Rash      Social History   Socioeconomic History   Marital status: Widowed    Spouse name: Not on file   Number of children: 1   Years of education: Not on file   Highest education level: Not on file  Occupational History   Occupation: retired  Tobacco Use   Smoking status: Former    Current packs/day: 0.00    Types: Cigarettes    Quit date: 09/23/1983    Years since quitting: 40.9   Smokeless tobacco: Never  Vaping Use   Vaping status: Never Used  Substance and Sexual Activity   Alcohol  use: Yes    Alcohol /week: 1.0 standard  drink of alcohol     Types: 1 Standard drinks or equivalent per week    Comment: occ   Drug use: Not Currently   Sexual activity: Not Currently    Partners: Male  Other Topics Concern   Not on file  Social History Narrative   Not on file   Social Drivers of Health   Financial Resource Strain: Low Risk  (08/16/2024)   Overall Financial Resource Strain (CARDIA)    Difficulty of Paying Living Expenses: Not very hard  Food Insecurity: No Food Insecurity (08/13/2024)   Hunger Vital Sign    Worried About Running Out of Food in the Last Year: Never true    Ran Out of Food in the Last Year: Never true  Transportation Needs: No Transportation Needs (08/13/2024)   PRAPARE - Administrator, Civil Service (Medical): No    Lack of Transportation (Non-Medical): No  Physical Activity: Not on file  Stress: Not on file  Social  Connections: Unknown (08/13/2024)   Social Connection and Isolation Panel    Frequency of Communication with Friends and Family: More than three times a week    Frequency of Social Gatherings with Friends and Family: More than three times a week    Attends Religious Services: Patient declined    Database Administrator or Organizations: Patient declined    Attends Banker Meetings: Patient declined    Marital Status: Patient declined  Intimate Partner Violence: Not At Risk (08/13/2024)   Humiliation, Afraid, Rape, and Kick questionnaire    Fear of Current or Ex-Partner: No    Emotionally Abused: No    Physically Abused: No    Sexually Abused: No      Family History  Problem Relation Age of Onset   Lung cancer Mother    Colon cancer Mother 4   Esophageal cancer Neg Hx    Rectal cancer Neg Hx    Stomach cancer Neg Hx    Colon polyps Neg Hx     There were no vitals filed for this visit.  PHYSICAL EXAM: General:  Well appearing. No respiratory difficulty HEENT: normal Neck: supple. no JVD. Carotids 2+ bilat; no bruits. No lymphadenopathy or thryomegaly appreciated. Cor: PMI nondisplaced. Regular rate & rhythm. No rubs, gallops or murmurs. Lungs: clear Abdomen: soft, nontender, nondistended. No hepatosplenomegaly. No bruits or masses. Good bowel sounds. Extremities: no cyanosis, clubbing, rash, edema Neuro: alert & oriented x 3, cranial nerves grossly intact. moves all 4 extremities w/o difficulty. Affect pleasant.  ECG:   ASSESSMENT & PLAN:  NYHA *** GDMT  Diuretic- BB- Ace/ARB/ARNI MRA SGLT2i    Referred to HFSW (PCP, Medications, Transportation, ETOH Abuse, Drug Abuse, Insurance, Financial ): Yes or No Refer to Pharmacy: Yes or No Refer to Home Health: Yes on No Refer to [facility]: Yes or no  Refer to General Cardiology: Yes or No  Follow up

## 2024-08-22 ENCOUNTER — Telehealth (HOSPITAL_COMMUNITY): Payer: Self-pay

## 2024-08-22 ENCOUNTER — Ambulatory Visit (HOSPITAL_COMMUNITY): Admit: 2024-08-22 | Discharge: 2024-08-22 | Disposition: A | Attending: Cardiology

## 2024-08-22 ENCOUNTER — Ambulatory Visit (HOSPITAL_COMMUNITY): Payer: Self-pay | Admitting: Cardiology

## 2024-08-22 ENCOUNTER — Encounter (HOSPITAL_COMMUNITY): Payer: Self-pay

## 2024-08-22 VITALS — BP 140/68 | HR 71 | Ht 65.0 in | Wt 150.8 lb

## 2024-08-22 DIAGNOSIS — I440 Atrioventricular block, first degree: Secondary | ICD-10-CM | POA: Diagnosis not present

## 2024-08-22 DIAGNOSIS — E785 Hyperlipidemia, unspecified: Secondary | ICD-10-CM | POA: Diagnosis not present

## 2024-08-22 DIAGNOSIS — I2510 Atherosclerotic heart disease of native coronary artery without angina pectoris: Secondary | ICD-10-CM | POA: Diagnosis not present

## 2024-08-22 DIAGNOSIS — I428 Other cardiomyopathies: Secondary | ICD-10-CM | POA: Diagnosis not present

## 2024-08-22 DIAGNOSIS — M069 Rheumatoid arthritis, unspecified: Secondary | ICD-10-CM | POA: Insufficient documentation

## 2024-08-22 DIAGNOSIS — E876 Hypokalemia: Secondary | ICD-10-CM | POA: Insufficient documentation

## 2024-08-22 DIAGNOSIS — I5022 Chronic systolic (congestive) heart failure: Secondary | ICD-10-CM | POA: Diagnosis not present

## 2024-08-22 DIAGNOSIS — G4733 Obstructive sleep apnea (adult) (pediatric): Secondary | ICD-10-CM | POA: Diagnosis not present

## 2024-08-22 DIAGNOSIS — I110 Hypertensive heart disease with heart failure: Secondary | ICD-10-CM | POA: Diagnosis not present

## 2024-08-22 DIAGNOSIS — I480 Paroxysmal atrial fibrillation: Secondary | ICD-10-CM | POA: Insufficient documentation

## 2024-08-22 DIAGNOSIS — Z79899 Other long term (current) drug therapy: Secondary | ICD-10-CM | POA: Insufficient documentation

## 2024-08-22 DIAGNOSIS — Z7901 Long term (current) use of anticoagulants: Secondary | ICD-10-CM | POA: Insufficient documentation

## 2024-08-22 LAB — BASIC METABOLIC PANEL WITH GFR
Anion gap: 8 (ref 5–15)
BUN: 21 mg/dL (ref 8–23)
CO2: 30 mmol/L (ref 22–32)
Calcium: 9.4 mg/dL (ref 8.9–10.3)
Chloride: 102 mmol/L (ref 98–111)
Creatinine, Ser: 0.88 mg/dL (ref 0.44–1.00)
GFR, Estimated: >60 mL/min (ref >60)
Glucose, Bld: 81 mg/dL (ref 70–99)
Potassium: 4 mmol/L (ref 3.5–5.1)
Sodium: 140 mmol/L (ref 135–145)

## 2024-08-22 NOTE — Telephone Encounter (Signed)
 Called patient to see if she is interested in the Cardiac Rehab Program. Patient expressed interest. Explained scheduling process and went over insurance, patient verbalized understanding. Will contact patient for scheduling once f/u has been completed.

## 2024-08-22 NOTE — Patient Instructions (Signed)
  Lab Work: Labs done today, your results will be available in MyChart, we will contact you for abnormal readings.  Follow-Up in: PLEASE FOLLOW UP WITH GENERAL CARDIOLOGY AS SCHEDULED   At the [facility], you and your health needs are our priority. We have a designated team specialized in the treatment of Heart Failure. This Care Team includes your primary Heart Failure Specialized Cardiologist (physician), Advanced Practice Providers (APPs- Physician Assistants and Nurse Practitioners), and Pharmacist who all work together to provide you with the care you need, when you need it.   You may see any of the following providers on your designated Care Team at your next follow up:  Dr. Toribio Fuel Dr. Ezra Shuck Dr. Odis Brownie Greig Mosses, NP Caffie Shed, [place] [facility] [place], [place] Beckey Coe, NP Jordan Lee, NP Tinnie Redman, PharmD   Please be sure to bring in all your medications bottles to every appointment.   Need to Contact Us :  If you have any questions or concerns before your next appointment please send us  a message through [place] or call our office at [phone].    TO LEAVE A MESSAGE FOR THE NURSE SELECT OPTION 2, PLEASE LEAVE A MESSAGE INCLUDING: YOUR NAME DATE OF BIRTH CALL BACK NUMBER REASON FOR CALL**this is important as we prioritize the call backs  YOU WILL RECEIVE A CALL BACK THE SAME DAY AS LONG AS YOU CALL BEFORE 4:00 PM

## 2024-08-25 NOTE — ED Provider Notes (Signed)
 [facility]E PROGRESSIVE CARE Provider Note   CSN: [id number] Arrival date & time: 08/12/24  1145     Patient presents with: Emesis   Loretta Austin is a 79 y.o. female.   78 yo F with a chief complaints of nausea vomiting and diarrhea.  Going on since last night.  Felt subjectively hot and cold.  Denies chest pain but says she has had some mild pressure.  Some abdominal discomfort.  No known sick contacts.   Emesis      Prior to Admission medications   Medication Sig Start Date End Date Taking? Authorizing Provider  adalimumab (HUMIRA, 2 PEN,) 40 MG/0.4ML pen Inject 40 mg into the skin every 14 (fourteen) days. Saturday's 12/22/23  Yes [provider]  Bacillus Coagulans-Inulin (ALIGN PREBIOTIC-PROBIOTIC) 5-1.25 MG-GM CHEW Chew 1 Dose by mouth daily.   Yes [provider]  budesonide  (ENTOCORT EC ) 3 MG 24 hr capsule Take 3 capsules (9 mg total) by mouth daily. Patient taking differently: Take 1 mg by mouth daily. 03/02/24  Yes Danis, Victory LITTIE MOULD, MD  Cholecalciferol (VITAMIN D3) 1.25 MG (50000 UT) CAPS Take 1 capsule by mouth once a week.   Yes [provider]  Cyanocobalamin  (VITAMIN B-12 PO) Take 2.5 mg by mouth daily.   Yes [provider]  irbesartan (AVAPRO) 150 MG tablet Take 150 mg by mouth daily. 01/01/24  Yes [provider]  Magnesium 400 MG TABS Take 1 tablet by mouth daily in the afternoon.   Yes [provider]  MELATONIN PO Take 10 mg by mouth at bedtime.   Yes [provider]  Multiple Vitamins-Iron (MULTIVITAMIN/IRON PO) Take 1 tablet by mouth daily.   Yes [provider]  RESTASIS  0.05 % ophthalmic emulsion Place 1 drop into both eyes 2 (two) times daily. 01/16/18  Yes [provider]  rosuvastatin  (CRESTOR ) 10 MG tablet Take 1 tablet (10 mg total) by mouth daily. 05/30/20  Yes Nahser, Aleene PARAS, MD  TYRVAYA  0.03 MG/ACT SOLN Place 1 drop into both eyes 2 (two) times daily.   Yes [provider]  venlafaxine  XR (EFFEXOR -XR) 37.5 MG 24 hr capsule Take 1 capsule (37.5 mg total) by mouth daily with breakfast. 07/21/24  Yes Odean Potts, MD  apixaban  (ELIQUIS ) 5 MG TABS tablet Take 1 tablet (5 mg total) by mouth 2 (two) times daily. 08/16/24   Vernon Ranks, MD  metoprolol  succinate (TOPROL -XL) 25 MG 24 hr tablet Take 1 tablet (25 mg total) by mouth 2 (two) times daily. 08/16/24 09/15/24  Vernon Ranks, MD    Allergies: Ciprofloxacin and Metronidazole    Review of Systems  Gastrointestinal:  Positive for vomiting.    Updated Vital Signs BP (!) 152/71 (BP Location: Right Arm)   Pulse 90   Temp 98.1 F (36.7 C) (Oral)   Resp 18   Ht 5' 5 (1.651 m)   Wt 68.3 kg   SpO2 98%   BMI 25.04 kg/m   Physical Exam Vitals and nursing note reviewed.  Constitutional:      General: She is not in acute distress.    Appearance: She is well-developed. She is not diaphoretic.  HENT:     Head: Normocephalic and atraumatic.  Eyes:     Pupils: Pupils are equal, round, and reactive to light.  Cardiovascular:     Rate and Rhythm: Normal rate and regular rhythm.     Heart sounds: No murmur heard.    No friction rub. No gallop.  Pulmonary:     Effort: Pulmonary effort is normal.     Breath sounds: No wheezing or rales.  Abdominal:     General: There is no distension.     Palpations: Abdomen is soft.     Tenderness: There is no abdominal tenderness.  Musculoskeletal:        General: No tenderness.     Cervical back: Normal range of motion and neck supple.  Skin:    General: Skin is warm and dry.  Neurological:     Mental Status: She is alert and oriented to person, place, and time.  Psychiatric:        Behavior: Behavior normal.     (all labs ordered are listed, but only abnormal results are displayed) Labs Reviewed  BASIC METABOLIC PANEL WITH GFR - Abnormal; Notable for the following components:      Result Value   Chloride 95 (*)    Glucose, Bld 172 (*)    BUN  24 (*)    Calcium  10.4 (*)    Anion gap 20 (*)    All other components within normal limits  CBC - Abnormal; Notable for the following components:   WBC 21.6 (*)    All other components within normal limits  HEPATIC FUNCTION PANEL - Abnormal; Notable for the following components:   Bilirubin, Direct 0.3 (*)    All other components within normal limits  URINALYSIS, W/ REFLEX TO CULTURE (INFECTION SUSPECTED) - Abnormal; Notable for the following components:   Color, Urine COLORLESS (*)    Hgb urine dipstick TRACE (*)    Ketones, ur 15 (*)    All other components within normal limits  CBC - Abnormal; Notable for the following components:   WBC 16.7 (*)    All other components within normal limits  BASIC METABOLIC PANEL WITH GFR - Abnormal; Notable for the following components:   CO2 20 (*)    Glucose, Bld 120 (*)    Calcium  8.6 (*)    All other components within normal limits  CK - Abnormal; Notable for the following components:   Total CK 343 (*)    All other components within normal limits  TSH - Abnormal; Notable for the following components:   TSH 5.146 (*)    All other components within normal limits  T3 - Abnormal; Notable for the following components:   T3, Total 214 (*)    All other components within normal limits  BASIC METABOLIC PANEL WITH GFR - Abnormal; Notable for the following components:   Glucose, Bld 118 (*)    Calcium  8.4 (*)    All other components within normal limits  CBC WITH DIFFERENTIAL/PLATELET - Abnormal; Notable for the following components:   Monocytes Absolute 1.1 (*)    All other components within normal limits  CBC - Abnormal; Notable for the following components:   WBC 13.1 (*)    All other components within normal limits  CBC - Abnormal; Notable for the following components:   WBC 10.9 (*)    All other components within normal limits  BASIC METABOLIC PANEL WITH GFR - Abnormal; Notable for the following components:   Potassium 3.3 (*)    CO2  21 (*)    Calcium  8.3 (*)    All other components within normal limits  LIPOPROTEIN A (LPA) - Abnormal; Notable for the following components:   Lipoprotein (a) 55.2 (*)    All other components within normal limits  TROPONIN T, HIGH SENSITIVITY - Abnormal;  Notable for the following components:   Troponin T High Sensitivity 365 (*)    All other components within normal limits  TROPONIN T, HIGH SENSITIVITY - Abnormal; Notable for the following components:   Troponin T High Sensitivity 676 (*)    All other components within normal limits  TROPONIN I (HIGH SENSITIVITY) - Abnormal; Notable for the following components:   Troponin I (High Sensitivity) 2,530 (*)    All other components within normal limits  CULTURE, BLOOD (ROUTINE X 2)  CULTURE, BLOOD (ROUTINE X 2)  RESP PANEL BY RT-PCR (RSV, FLU A&B, COVID)  RVPGX2  LIPASE, BLOOD  CREATININE, SERUM  MAGNESIUM  HEPARIN  LEVEL (UNFRACTIONATED)  SEDIMENTATION RATE  C-REACTIVE PROTEIN  HEPARIN  LEVEL (UNFRACTIONATED)  T4, FREE  HEPARIN  LEVEL (UNFRACTIONATED)  POCT ACTIVATED CLOTTING TIME    EKG: EKG Interpretation Date/Time:  Friday August 12 2024 16:29:41 EST Ventricular Rate:  83 PR Interval:  227 QRS Duration:  88 QT Interval:  427 QTC Calculation: 502 R Axis:   -44  Text Interpretation: Sinus rhythm Prolonged PR interval Left axis deviation Anteroseptal infarct, age indeterminate Prolonged QT interval Confirmed by Pamella Sharper [phone]) on 08/12/2024 5:14:31 PM  Radiology: No results found.   .Critical Care  Performed by: Emil Share, DO Authorized by: Emil Share, DO   Critical care provider statement:    Critical care time (minutes):  35   Critical care time was exclusive of:  Separately billable procedures and treating other patients   Critical care was time spent personally by me on the following activities:  Development of treatment plan with patient or surrogate, discussions with consultants, evaluation of patient's  response to treatment, examination of patient, ordering and review of laboratory studies, ordering and review of radiographic studies, ordering and performing treatments and interventions, pulse oximetry, re-evaluation of patient's condition and review of old charts   Care discussed with: admitting provider      Medications Ordered in the ED  0.9 %  sodium chloride  infusion ( Intravenous Stopped 08/14/24 0134)  nitroGLYCERIN  100 mcg/mL intra-arterial injection (has no administration in time range)  labetalol  (NORMODYNE ) injection 10 mg (has no administration in time range)  hydrALAZINE  (APRESOLINE ) injection 10 mg (has no administration in time range)  iohexol  (OMNIPAQUE ) 350 MG/ML injection 90 mL (90 mLs Intravenous Contrast Given 08/12/24 1450)  aspirin  chewable tablet 324 mg (324 mg Oral Given 08/12/24 1537)  cefTRIAXone  (ROCEPHIN ) 1 g in sodium chloride  0.9 % 100 mL IVPB (0 g Intravenous Stopped 08/12/24 1830)  vancomycin  (VANCOREADY) IVPB 1250 mg/250 mL (0 mg Intravenous Stopped 08/13/24 0224)  diltiazem  (CARDIZEM ) injection 10 mg (10 mg Intravenous Given 08/13/24 0357)  prochlorperazine  (COMPAZINE ) 10 MG/2ML injection (10 mg  Given 08/13/24 0355)  heparin  bolus via infusion 4,000 Units (4,000 Units Intravenous Bolus from Bag 08/13/24 0526)  aspirin  chewable tablet 81 mg (81 mg Oral Given 08/15/24 0601)  free water  500 mL (500 mLs Oral Given 08/15/24 1806)                                    Medical Decision Making Amount and/or Complexity of Data Reviewed Labs: ordered. Radiology: ordered.  Risk OTC drugs. Prescription drug management. Decision regarding hospitalization.   78 yo F with a chief complaints of what sounds like a gastrointestinal illness nausea vomiting diarrhea going on since last night.  Her troponin was significantly elevated.  She denies any specific chest discomfort.  I discussed this with cardiology, Dr Pietro.  Recommended medical admission.  The  patients results and plan were reviewed and discussed.   Any x-rays performed were independently reviewed by myself.   Differential diagnosis were considered with the presenting HPI.  Medications  0.9 %  sodium chloride  infusion ( Intravenous Stopped 08/14/24 0134)  nitroGLYCERIN  100 mcg/mL intra-arterial injection (has no administration in time range)  labetalol  (NORMODYNE ) injection 10 mg (has no administration in time range)  hydrALAZINE  (APRESOLINE ) injection 10 mg (has no administration in time range)  iohexol  (OMNIPAQUE ) 350 MG/ML injection 90 mL (90 mLs Intravenous Contrast Given 08/12/24 1450)  aspirin  chewable tablet 324 mg (324 mg Oral Given 08/12/24 1537)  cefTRIAXone  (ROCEPHIN ) 1 g in sodium chloride  0.9 % 100 mL IVPB (0 g Intravenous Stopped 08/12/24 1830)  vancomycin  (VANCOREADY) IVPB 1250 mg/250 mL (0 mg Intravenous Stopped 08/13/24 0224)  diltiazem  (CARDIZEM ) injection 10 mg (10 mg Intravenous Given 08/13/24 0357)  prochlorperazine  (COMPAZINE ) 10 MG/2ML injection (10 mg  Given 08/13/24 0355)  heparin  bolus via infusion 4,000 Units (4,000 Units Intravenous Bolus from Bag 08/13/24 0526)  aspirin  chewable tablet 81 mg (81 mg Oral Given 08/15/24 0601)  free water  500 mL (500 mLs Oral Given 08/15/24 1806)    Vitals:   08/16/24 0022 08/16/24 0432 08/16/24 0748 08/16/24 0832  BP: 122/74 130/74 (!) 142/66 (!) 152/71  Pulse: 63 73 80 90  Resp: 18 18 20 18   Temp: 98 F (36.7 C) 98.3 F (36.8 C) 98.3 F (36.8 C) 98.1 F (36.7 C)  TempSrc: Oral Oral Oral Oral  SpO2: 99% 97% 98% 98%  Weight:      Height:        Final diagnoses:  Elevated troponin  Nausea and vomiting, unspecified vomiting type  Leukocytosis, unspecified type    Admission/ observation were discussed with the admitting physician, patient and/or family and they are comfortable with the plan.       Final diagnoses:  Elevated troponin  Nausea and vomiting, unspecified vomiting type  Leukocytosis,  unspecified type    ED Discharge Orders          Ordered    apixaban  (ELIQUIS ) 5 MG TABS tablet  2 times daily        08/16/24 1014    metoprolol  succinate (TOPROL -XL) 25 MG 24 hr tablet  2 times daily        08/16/24 1014    Ambulatory referral to Physical Therapy        08/16/24 1015    Amb Referral to Cardiac Rehabilitation        08/16/24 1051    AMB referral to Phase II Cardiac Rehabilitation  Status:  Canceled        08/15/24 1613               Emil Share, DO 08/25/24 1459

## 2024-09-01 ENCOUNTER — Ambulatory Visit: Admitting: Nurse Practitioner

## 2024-09-01 DIAGNOSIS — N390 Urinary tract infection, site not specified: Secondary | ICD-10-CM | POA: Diagnosis not present

## 2024-09-01 DIAGNOSIS — R350 Frequency of micturition: Secondary | ICD-10-CM | POA: Diagnosis not present

## 2024-09-01 DIAGNOSIS — R351 Nocturia: Secondary | ICD-10-CM | POA: Diagnosis not present

## 2024-09-05 ENCOUNTER — Other Ambulatory Visit: Payer: Self-pay | Admitting: Cardiovascular Disease

## 2024-09-05 ENCOUNTER — Encounter: Payer: Self-pay | Admitting: Cardiovascular Disease

## 2024-09-05 ENCOUNTER — Ambulatory Visit: Attending: Cardiovascular Disease | Admitting: Cardiovascular Disease

## 2024-09-05 VITALS — BP 130/69 | HR 76 | Ht 65.0 in | Wt 152.3 lb

## 2024-09-05 DIAGNOSIS — I10 Essential (primary) hypertension: Secondary | ICD-10-CM

## 2024-09-05 DIAGNOSIS — I519 Heart disease, unspecified: Secondary | ICD-10-CM | POA: Diagnosis present

## 2024-09-05 DIAGNOSIS — I2510 Atherosclerotic heart disease of native coronary artery without angina pectoris: Secondary | ICD-10-CM | POA: Diagnosis present

## 2024-09-05 DIAGNOSIS — I214 Non-ST elevation (NSTEMI) myocardial infarction: Secondary | ICD-10-CM | POA: Insufficient documentation

## 2024-09-05 DIAGNOSIS — I428 Other cardiomyopathies: Secondary | ICD-10-CM | POA: Diagnosis present

## 2024-09-05 DIAGNOSIS — E785 Hyperlipidemia, unspecified: Secondary | ICD-10-CM | POA: Diagnosis present

## 2024-09-05 DIAGNOSIS — G4733 Obstructive sleep apnea (adult) (pediatric): Secondary | ICD-10-CM

## 2024-09-05 MED ORDER — IRBESARTAN 300 MG PO TABS: 300.0000 mg | ORAL_TABLET | Freq: Every day | ORAL | 0 refills | Status: DC

## 2024-09-05 MED ORDER — IRBESARTAN 300 MG PO TABS: 300.0000 mg | ORAL_TABLET | Freq: Every day | ORAL | 3 refills | Status: AC

## 2024-09-05 MED ORDER — IRBESARTAN 300 MG PO TABS: 300.0000 mg | ORAL_TABLET | Freq: Every day | ORAL | 3 refills | Status: DC

## 2024-09-05 NOTE — Progress Notes (Unsigned)
 Cardiology Office Note   Date:  09/05/2024  ID:  Loretta Austin, DOB 04/27/1946, MRN [id number] PCP: Yolande Toribio MATSU, MD  [facility] Providers Cardiologist:  Soyla DELENA Merck, MD { Click to update primary MD,subspecialty MD or APP then REFRESH:1}    History of Present Illness Loretta Austin is a 78 y.o. female who is being seen today for the evaluation of arrhythmia at the request of Yolande Toribio MATSU, MD.   Loretta Austin is a 78 year old female with recently diagnosed mild single-vessel nonobstructive coronary artery disease and mildly depressed LV function who presents with episodes of flushing and sweating.  She has been experiencing episodes of intense heat and sweating, particularly down the back of her neck, followed by feeling cold. These episodes have been occurring for about three months, occurring mid-morning, in the middle of the night, and sometimes during the day, waking her up at night. She feels a lack of energy and spends most of her day sitting.  Last month, she was hospitalized for symptoms including intense heat, sweating, and panting, which led her to seek emergency care. During her hospitalization, she was told she had new onset atrial fibrillation, lasting for about an hour.  Two rhythm strip dated 08/13/2024 at 0328 hrs. and 0410 hours show a narrow complex tachycardia with a rate of 160 bpm, a morphology that suggest possible atrial flutter with variable AV block, although there is 1 other strip in between those 2 that is more compatible with coarse atrial fibrillation..  She reports taking Eliquis  and metoprolol . She has been experiencing these symptoms for about eight weeks prior to hospitalization.  She uses a smartwatch to monitor her heart rhythm and has noted occasional episodes of heart racing. She is concerned about whether her medications need adjustment due to her lack of energy.  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 She was previously diagnosed with systolic heart failure with reduced ejection fraction (40-45%) and minimal elevation in high sensitivity troponin. She is on irbesartan  150 mg daily.  She reports a history of aura migraines, which have occurred several times in the last month, but not recently. She also mentions having a large gallstone for 25 years and a family history of appendicitis.  She has been taking budesonide  for colitis and Humira for rheumatoid arthritis. Her fingers are in good condition except for one affected by rheumatoid arthritis after discontinuing Humira previously.  She also reports dry eyes and has been using eye drops for the past three months. Physical Exam   ROS: ***  Studies Reviewed      *** Risk Assessment/Calculations {Does this patient have ATRIAL FIBRILLATION?:[phone]}         Physical Exam VS:  BP 130/69 (BP Location: Left Arm, Patient Position: Sitting, Cuff Size: Normal)   Pulse 76   Ht 5' 5 (1.651 m)   Wt 152 lb 4.8 oz (69.1 kg)   SpO2 97%   BMI 25.34 kg/m        Wt Readings from Last 3 Encounters:  09/05/24 152 lb 4.8 oz (69.1 kg)  08/22/24 150 lb 12.8 oz (68.4 kg)  08/12/24 150 lb 8 oz (68.3 kg)    GEN: Well nourished, well developed in no acute distress NECK: No JVD; No carotid bruits CARDIAC: ***RRR, no murmurs, rubs, gallops RESPIRATORY:  Clear to auscultation without rales, wheezing or  rhonchi  ABDOMEN: Soft, non-tender, non-distended EXTREMITIES:  No edema; No deformity   ASSESSMENT AND PLAN ***  Takotsubo cardiomyopathy (stress-induced cardiomyopathy) with recovered ejection fraction Recent hospitalization for systolic heart failure with reduced ejection fraction (40-45%) and minimal elevation in high sensitivity troponin. 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 ECG changes suggestive of LAD ischemia versus Takotsubo syndrome. Symptoms include hot flashes and sweating, possibly related to stress-induced cardiomyopathy. No lasting damage expected from the episode. - Increased irbesartan  dosage to 300 mg daily. - Will repeat echocardiogram to assess heart function. - Will schedule cardiac rehabilitation.  Atrial fibrillation, paroxysmal New onset atrial fibrillation during recent hospitalization, converted spontaneously to normal sinus rhythm. Currently on anticoagulation with Eliquis  and rate control with metoprolol . No current atrial fibrillation detected on ECG. - Continue Eliquis  for anticoagulation. - Continue metoprolol  for rate control. - Monitor for episodes of arrhythmia using home EKG device.  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 No significant blockage noted during recent catheterization.  Hypertension Blood pressure management with irbesartan  and metoprolol . Recent symptoms of hot flashes and sweating may be related to stress-induced cardiomyopathy rather than hypertension. - Increased irbesartan  dosage to 300 mg daily. {The patient has an active order for outpatient cardiac rehabilitation.   Please indicate if the patient is ready to start. Do NOT delete this.  It will auto delete.  Refresh note, then sign.              Click here to document readiness and see contraindications.  :1}  Cardiac Rehabilitation Eligibility Assessment  The patient is ready to start cardiac rehabilitation from a cardiac standpoint.       Dispo: ***  Signed, Jerel Balding, MD

## 2024-09-05 NOTE — Patient Instructions (Signed)
 Medication Instructions:  Increase Irbesartan  to 300 mg daily *If you need a refill on your cardiac medications before your next appointment, please call your pharmacy*  Lab Work: None ordered If you have labs (blood work) drawn today and your tests are completely normal, you will receive your results only by: MyChart Message (if you have MyChart) OR A paper copy in the mail If you have any lab test that is abnormal or we need to change your treatment, we will call you to review the results.  Testing/Procedures: Your physician has requested that you have a Limited  echocardiogram. Echocardiography is a painless test that uses sound waves to create images of your heart. It provides your doctor with information about the size and shape of your heart and how well your hearts chambers and valves are working. This procedure takes approximately one hour. There are no restrictions for this procedure. Please do NOT wear cologne, perfume, aftershave, or lotions (deodorant is allowed). Please arrive 15 minutes prior to your appointment time.  Please note: We ask at that you not bring children with you during ultrasound (echo/ vascular) testing. Due to room size and safety concerns, children are not allowed in the ultrasound rooms during exams. Our front office staff cannot provide observation of children in our lobby area while testing is being conducted. An adult accompanying a patient to their appointment will only be allowed in the ultrasound room at the discretion of the ultrasound technician under special circumstances. We apologize for any inconvenience.   Follow-Up: At [facility], you and your health needs are our priority.  As part of our continuing mission to provide you with exceptional heart care, our providers are all part of one team.  This team includes your primary Cardiologist (physician) and Advanced Practice Providers or APPs (Physician Assistants and Nurse Practitioners) who  all work together to provide you with the care you need, when you need it.  Your next appointment:   APP- 2 months  Dr Francyne- 4-5 months  We recommend signing up for the patient portal called MyChart.  Sign up information is provided on this After Visit Summary.  MyChart is used to connect with patients for Virtual Visits (Telemedicine).  Patients are able to view lab/test results, encounter notes, upcoming appointments, etc.  Non-urgent messages can be sent to your provider as well.   To learn more about what you can do with MyChart, go to [email or web address].

## 2024-09-06 ENCOUNTER — Encounter: Payer: Self-pay | Admitting: Cardiovascular Disease

## 2024-09-06 DIAGNOSIS — H04123 Dry eye syndrome of bilateral lacrimal glands: Secondary | ICD-10-CM | POA: Diagnosis not present

## 2024-09-08 ENCOUNTER — Encounter: Payer: Self-pay | Admitting: Cardiovascular Disease

## 2024-09-08 MED ORDER — METOPROLOL SUCCINATE ER 25 MG PO TB24: 25.0000 mg | ORAL_TABLET | Freq: Two times a day (BID) | ORAL | 3 refills | Status: AC

## 2024-09-13 ENCOUNTER — Encounter: Payer: Self-pay | Admitting: Cardiovascular Disease

## 2024-09-13 MED ORDER — APIXABAN 5 MG PO TABS: 5.0000 mg | ORAL_TABLET | Freq: Two times a day (BID) | ORAL | 3 refills | Status: AC

## 2024-09-13 NOTE — Telephone Encounter (Signed)
 Prescription refill request for Eliquis  received. Indication: AF Last office visit: 09/05/2024 Scr: 0.88 08/22/2024 Age: 78 Weight: 69.7kg  Dose appropriate, refill sent

## 2024-09-21 ENCOUNTER — Telehealth (HOSPITAL_COMMUNITY): Payer: Self-pay

## 2024-09-21 NOTE — Telephone Encounter (Signed)
 Called patient to see if she was interested in participating in the Cardiac Rehab Program. Patient will come in for orientation on 1/08 and will attend the 10:15 exercise class.  Sent MyChart message.  Patient is aware we cannot verify insurance benefits until after 1/01, confirmed her insurance is not changing and went over the estimate with her Medicare B and [employer] supplement.

## 2024-09-23 ENCOUNTER — Encounter: Payer: Self-pay | Admitting: Cardiovascular Disease

## 2024-09-26 ENCOUNTER — Telehealth (HOSPITAL_COMMUNITY): Payer: Self-pay | Admitting: *Deleted

## 2024-09-26 NOTE — Telephone Encounter (Signed)
 LVM confirming pt's appointment in Cardiac Rehab 1/8 at 1:15. Aliene Aris BS, ACSM-CEP 09/26/2024 1:39 PM

## 2024-09-27 ENCOUNTER — Telehealth (HOSPITAL_COMMUNITY): Payer: Self-pay

## 2024-09-27 NOTE — Telephone Encounter (Signed)
 Confirmed cardiac orientation appointment time of 09/29/24 at 1315.  Cardiac health history completed.

## 2024-09-27 NOTE — Telephone Encounter (Signed)
 Patient returned call from Kristan, confirmed she will be attending cardiac rehab orientation on 1/08 at 1:15.

## 2024-09-29 ENCOUNTER — Encounter (HOSPITAL_COMMUNITY)
Admission: RE | Admit: 2024-09-29 | Discharge: 2024-09-29 | Disposition: A | Discharge status: Home / Self Care | Source: Ambulatory Visit | Attending: Internal Medicine | Admitting: Internal Medicine

## 2024-09-29 VITALS — BP 134/66 | HR 64 | Ht 65.0 in | Wt 151.5 lb

## 2024-09-29 DIAGNOSIS — Z48812 Encounter for surgical aftercare following surgery on the circulatory system: Secondary | ICD-10-CM | POA: Insufficient documentation

## 2024-09-29 DIAGNOSIS — I214 Non-ST elevation (NSTEMI) myocardial infarction: Secondary | ICD-10-CM

## 2024-09-29 DIAGNOSIS — I252 Old myocardial infarction: Secondary | ICD-10-CM | POA: Insufficient documentation

## 2024-09-29 NOTE — Progress Notes (Signed)
 Cardiac Rehab Medication Review by a Nurse   Does the patient  feel that his/her medications are working for him/her?  unsure   Has the patient been experiencing any side effects to the medications prescribed?  experiencing hot flashes    Does the patient measure his/her own blood pressure or blood glucose at home?  yes    Does the patient have any problems obtaining medications due to transportation or finances?   no   Understanding of regimen: excellent Understanding of indications: excellent Potential of compliance: excellent   Nurse comments: Pt states, I have no questions about my prescribed medications at this time.

## 2024-09-29 NOTE — Progress Notes (Signed)
 Cardiac Individual Treatment Plan  Patient Details  Name: Loretta Austin MRN: [id number] Date of Birth: April 11, 1946 Referring Provider:   Flowsheet Row INTENSIVE CARDIAC REHAB ORIENT from 09/29/2024 in [facility]  Referring Provider Dr. Jerel Balding MD    Initial Encounter Date:  Flowsheet Row INTENSIVE CARDIAC REHAB ORIENT from 09/29/2024 in [facility], [facility]  Date 09/29/24    Visit Diagnosis: 08/14/24 NSTEMI (non-ST elevated myocardial infarction) [facility])  Patient's Home Medications on Admission: Current Medications[1]  Past Medical History: Past Medical History:  Diagnosis Date   Arthritis    Cholecystolithiasis    Colon polyps    Diverticulosis    Food poisoning    GERD (gastroesophageal reflux disease)    Heart murmur    High blood pressure    Hyperlipidemia    Microscopic colitis    Osteoporosis    PAD (peripheral artery disease) 01/16/2023   Lower extremity arterial Dopplers 02/06/2023: Right-no stenosis, mild-moderate atherosclerosis throughout especially in CFA and calf vessels  ABIs 02/06/2023: Right 0.91, left 1.12   Sjogren's disease    Sleep apnea    wears appliance    Tobacco Use: Tobacco Use History[2]  Labs: Review Flowsheet        No data to display          Capillary Blood Glucose: No results found for: GLUCAP   Exercise Target Goals: Exercise Program Goal: Individual exercise prescription set using results from initial 6 min walk test and THRR while considering  patients activity barriers and safety.   Exercise Prescription Goal: Initial exercise prescription builds to 30-45 minutes a day of aerobic activity, 2-3 days per week.  Home exercise guidelines will be given to patient during program as part of exercise prescription that the participant will acknowledge.  Activity Barriers & Risk Stratification:  Activity Barriers &  Cardiac Risk Stratification - 09/29/24 1557       Activity Barriers & Cardiac Risk Stratification   Activity Barriers Balance Concerns;Joint Problems;Arthritis;Back Problems;Neck/Spine Problems    Cardiac Risk Stratification High          6 Minute Walk:  6 Minute Walk     Row Name 09/29/24 1555         6 Minute Walk   Phase Initial     Distance 1330 feet     Walk Time 6 minutes     # of Rest Breaks 0     MPH 2.52     METS 2.88     RPE 12     Perceived Dyspnea  0     VO2 Peak 10.1     Symptoms Yes (comment)     Comments Right hip pain 2/10, right knee pain 1/10 both due to sciatic nerve     Resting HR 64 bpm     Resting BP 134/66     Resting Oxygen Saturation  99 %     Exercise Oxygen Saturation  during 6 min walk 99 %     Max Ex. HR 106 bpm     Max Ex. BP 152/72     2 Minute Post BP 138/72        Oxygen Initial Assessment:   Oxygen Re-Evaluation:   Oxygen Discharge (Final Oxygen Re-Evaluation):   Initial Exercise Prescription:  Initial Exercise Prescription - 09/29/24 1300       Date of Initial Exercise RX and Referring Provider   Date  09/29/24    Referring Provider Dr. Jerel Balding MD    Expected Discharge Date 12/23/24      Recumbant Bike   Level 1    RPM 45    Watts 15    Minutes 15    METs 1.9      NuStep   Level 1    SPM 65    Minutes 15    METs 1.8      Prescription Details   Frequency (times per week) 3    Duration Progress to 30 minutes of continuous aerobic without signs/symptoms of physical distress      Intensity   THRR 40-80% of Max Heartrate 57-114    Ratings of Perceived Exertion 11-13    Perceived Dyspnea 0-4      Progression   Progression Continue progressive overload as per policy without signs/symptoms or physical distress.      Resistance Training   Training Prescription Yes          Perform Capillary Blood Glucose checks as needed.  Exercise Prescription Changes:   Exercise Comments:   Exercise  Goals and Review:   Exercise Goals     Row Name 09/29/24 1350             Exercise Goals   Increase Physical Activity Yes       Intervention Provide advice, education, support and counseling about physical activity/exercise needs.;Develop an individualized exercise prescription for aerobic and resistive training based on initial evaluation findings, risk stratification, comorbidities and participant's personal goals.       Expected Outcomes Short Term: Attend rehab on a regular basis to increase amount of physical activity.;Long Term: Exercising regularly at least 3-5 days a week.;Long Term: Add in home exercise to make exercise part of routine and to increase amount of physical activity.       Increase Strength and Stamina Yes       Intervention Provide advice, education, support and counseling about physical activity/exercise needs.;Develop an individualized exercise prescription for aerobic and resistive training based on initial evaluation findings, risk stratification, comorbidities and participant's personal goals.       Expected Outcomes Short Term: Increase workloads from initial exercise prescription for resistance, speed, and METs.;Short Term: Perform resistance training exercises routinely during rehab and add in resistance training at home;Long Term: Improve cardiorespiratory fitness, muscular endurance and strength as measured by increased METs and functional capacity ( )       Able to understand and use rate of perceived exertion (RPE) scale Yes       Intervention Provide education and explanation on how to use RPE scale       Expected Outcomes Short Term: Able to use RPE daily in rehab to express subjective intensity level;Long Term:  Able to use RPE to guide intensity level when exercising independently       Knowledge and understanding of Target Heart Rate Range (THRR) Yes       Intervention Provide education and explanation of THRR including how the numbers were predicted  and where they are located for reference       Expected Outcomes Short Term: Able to state/look up THRR;Long Term: Able to use THRR to govern intensity when exercising independently;Short Term: Able to use daily as guideline for intensity in rehab       Understanding of Exercise Prescription Yes       Intervention Provide education, explanation, and written materials on patient's individual exercise prescription  Expected Outcomes Short Term: Able to explain program exercise prescription;Long Term: Able to explain home exercise prescription to exercise independently          Exercise Goals Re-Evaluation :   Discharge Exercise Prescription (Final Exercise Prescription Changes):   Nutrition:  Target Goals: Understanding of nutrition guidelines, daily intake of sodium 1500mg , cholesterol 200mg , calories 30% from fat and 7% or less from saturated fats, daily to have 5 or more servings of fruits and vegetables.  Biometrics:  Pre Biometrics - 09/29/24 1558       Pre Biometrics   Height 5' 5 (1.651 m)    Weight 68.7 kg    Waist Circumference 35 inches    Hip Circumference 40.5 inches    Waist to Hip Ratio 0.86 %    BMI (Calculated) 25.2    Triceps Skinfold 16 mm    % Body Fat 35.3 %    Grip Strength 15 kg    Flexibility 12.75 in    Single Leg Stand 3 seconds           Nutrition Therapy Plan and Nutrition Goals:   Nutrition Assessments:  MEDIFICTS Score Key: >=70 Need to make dietary changes  40-70 Heart Healthy Diet <= 40 Therapeutic Level Cholesterol Diet   Flowsheet Row INTENSIVE CARDIAC REHAB ORIENT from 09/29/2024 in [facility] for Heart, Vascular, & Lung [facility]  Picture Your Plate Total Score on Admission 64   Picture Your Plate Scores: <59 Unhealthy dietary pattern with much room for improvement. 41-50 Dietary pattern unlikely to meet recommendations for good health and room for improvement. 51-60 More healthful dietary pattern,  with some room for improvement.  >60 Healthy dietary pattern, although there may be some specific behaviors that could be improved.    Nutrition Goals Re-Evaluation:   Nutrition Goals Re-Evaluation:   Nutrition Goals Discharge (Final Nutrition Goals Re-Evaluation):   Psychosocial: Target Goals: Acknowledge presence or absence of significant depression and/or stress, maximize coping skills, provide positive support system. Participant is able to verbalize types and ability to use techniques and skills needed for reducing stress and depression.  Initial Review & Psychosocial Screening:  Initial Psych Review & Screening - 09/29/24 1329       Initial Review   Current issues with Current Depression      Family Dynamics   Good Support System? Yes   Pt has a daughter, bridge group, and a close friend as a part of her support system     Barriers   Psychosocial barriers to participate in program The patient should benefit from training in stress management and relaxation.      Screening Interventions   Interventions Encouraged to exercise;To provide support and resources with identified psychosocial needs;Provide feedback about the scores to participant    Expected Outcomes Long Term Goal: Stressors or current issues are controlled or eliminated.;Short Term goal: Identification and review with participant of any Quality of Life or Depression concerns found by scoring the questionnaire.;Long Term goal: The participant improves quality of Life and PHQ9 Scores as seen by post scores and/or verbalization of changes          Quality of Life Scores:  Quality of Life - 09/29/24 1409       Quality of Life   Select Quality of Life      Quality of Life Scores   Health/Function Pre 23.63 %    Socioeconomic Pre 30 %    Psych/Spiritual Pre 30 %    Family  Pre 24 %    GLOBAL Pre 26.15 %         Scores of 19 and below usually indicate a poorer quality of life in these areas.  A  difference of  2-3 points is a clinically meaningful difference.  A difference of 2-3 points in the total score of the Quality of Life Index has been associated with significant improvement in overall quality of life, self-image, physical symptoms, and general health in studies assessing change in quality of life.  PHQ-9: Review Flowsheet       09/29/2024 07/19/2024  Depression screen PHQ 2/9  Decreased Interest 1 0  Down, Depressed, Hopeless 1 0  PHQ - 2 Score 2 0  Altered sleeping 0 -  Tired, decreased energy 1 -  Change in appetite 1 -  Feeling bad or failure about yourself  0 -  Trouble concentrating 0 -  Moving slowly or fidgety/restless 0 -  Suicidal thoughts 0 -  PHQ-9 Score 4 -  Difficult doing work/chores Not difficult at all -   Interpretation of Total Score  Total Score Depression Severity:  1-4 = Minimal depression, 5-9 = Mild depression, 10-14 = Moderate depression, 15-19 = Moderately severe depression, 20-27 = Severe depression   Psychosocial Evaluation and Intervention:   Psychosocial Re-Evaluation:   Psychosocial Discharge (Final Psychosocial Re-Evaluation):   Vocational Rehabilitation: Provide vocational rehab assistance to qualifying candidates.   Vocational Rehab Evaluation & Intervention:  Vocational Rehab - 09/29/24 1428       Initial Vocational Rehab Evaluation & Intervention   Assessment shows need for Vocational Rehabilitation No   Retired         Education: Education Goals: Education classes will be provided on a weekly basis, covering required topics. Participant will state understanding/return demonstration of topics presented.     Core Videos: Exercise    Move It!  Clinical staff conducted group or individual video education with verbal and written material and guidebook.  Patient learns the recommended Pritikin exercise program. Exercise with the goal of living a long, healthy life. Some of the health benefits of exercise include  controlled diabetes, healthier blood pressure levels, improved cholesterol levels, improved heart and lung capacity, improved sleep, and better body composition. Everyone should speak with their doctor before starting or changing an exercise routine.  Biomechanical Limitations Clinical staff conducted group or individual video education with verbal and written material and guidebook.  Patient learns how biomechanical limitations can impact exercise and how we can mitigate and possibly overcome limitations to have an impactful and balanced exercise routine.  Body Composition Clinical staff conducted group or individual video education with verbal and written material and guidebook.  Patient learns that body composition (ratio of muscle mass to fat mass) is a key component to assessing overall fitness, rather than body weight alone. Increased fat mass, especially visceral belly fat, can put us  at increased risk for metabolic syndrome, type 2 diabetes, heart disease, and even death. It is recommended to combine diet and exercise (cardiovascular and resistance training) to improve your body composition. Seek guidance from your physician and exercise physiologist before implementing an exercise routine.  Exercise Action Plan Clinical staff conducted group or individual video education with verbal and written material and guidebook.  Patient learns the recommended strategies to achieve and enjoy long-term exercise adherence, including variety, self-motivation, self-efficacy, and positive decision making. Benefits of exercise include fitness, good health, weight management, more energy, better sleep, less stress, and overall well-being.  Medical  Heart Disease Risk Reduction Clinical staff conducted group or individual video education with verbal and written material and guidebook.  Patient learns our heart is our most vital organ as it circulates oxygen, nutrients, white blood cells, and hormones  throughout the entire body, and carries waste away. Data supports a plant-based eating plan like the Pritikin Program for its effectiveness in slowing progression of and reversing heart disease. The video provides a number of recommendations to address heart disease.   Metabolic Syndrome and Belly Fat  Clinical staff conducted group or individual video education with verbal and written material and guidebook.  Patient learns what metabolic syndrome is, how it leads to heart disease, and how one can reverse it and keep it from coming back. You have metabolic syndrome if you have 3 of the following 5 criteria: abdominal obesity, high blood pressure, high triglycerides, low HDL cholesterol, and high blood sugar.  Hypertension and Heart Disease Clinical staff conducted group or individual video education with verbal and written material and guidebook.  Patient learns that high blood pressure, or hypertension, is very common in the [place] . Hypertension is largely due to excessive salt intake, but other important risk factors include being overweight, physical inactivity, drinking too much alcohol , smoking, and not eating enough potassium from fruits and vegetables. High blood pressure is a leading risk factor for heart attack, stroke, congestive heart failure, dementia, kidney failure, and premature death. Long-term effects of excessive salt intake include stiffening of the arteries and thickening of heart muscle and organ damage. Recommendations include ways to reduce hypertension and the risk of heart disease.  Diseases of Our Time - Focusing on Diabetes Clinical staff conducted group or individual video education with verbal and written material and guidebook.  Patient learns why the best way to stop diseases of our time is prevention, through food and other lifestyle changes. Medicine (such as prescription pills and surgeries) is often only a Band-Aid on the problem, not a long-term solution. Most  common diseases of our time include obesity, type 2 diabetes, hypertension, heart disease, and cancer. The Pritikin Program is recommended and has been proven to help reduce, reverse, and/or prevent the damaging effects of metabolic syndrome.  Nutrition   Overview of the Pritikin Eating Plan  Clinical staff conducted group or individual video education with verbal and written material and guidebook.  Patient learns about the Pritikin Eating Plan for disease risk reduction. The Pritikin Eating Plan emphasizes a wide variety of unrefined, minimally-processed carbohydrates, like fruits, vegetables, whole grains, and legumes. Go, Caution, and Stop food choices are explained. Plant-based and lean animal proteins are emphasized. Rationale provided for low sodium intake for blood pressure control, low added sugars for blood sugar stabilization, and low added fats and oils for coronary artery disease risk reduction and weight management.  Calorie Density  Clinical staff conducted group or individual video education with verbal and written material and guidebook.  Patient learns about calorie density and how it impacts the Pritikin Eating Plan. Knowing the characteristics of the food you choose will help you decide whether those foods will lead to weight gain or weight loss, and whether you want to consume more or less of them. Weight loss is usually a side effect of the Pritikin Eating Plan because of its focus on low calorie-dense foods.  Label Reading  Clinical staff conducted group or individual video education with verbal and written material and guidebook.  Patient learns about the Pritikin recommended label reading guidelines and corresponding recommendations  regarding calorie density, added sugars, sodium content, and whole grains.  Dining Out - Part 1  Clinical staff conducted group or individual video education with verbal and written material and guidebook.  Patient learns that restaurant meals  can be sabotaging because they can be so high in calories, fat, sodium, and/or sugar. Patient learns recommended strategies on how to positively address this and avoid unhealthy pitfalls.  Facts on Fats  Clinical staff conducted group or individual video education with verbal and written material and guidebook.  Patient learns that lifestyle modifications can be just as effective, if not more so, as many medications for lowering your risk of heart disease. A Pritikin lifestyle can help to reduce your risk of inflammation and atherosclerosis (cholesterol build-up, or plaque, in the artery walls). Lifestyle interventions such as dietary choices and physical activity address the cause of atherosclerosis. A review of the types of fats and their impact on blood cholesterol levels, along with dietary recommendations to reduce fat intake is also included.  Nutrition Action Plan  Clinical staff conducted group or individual video education with verbal and written material and guidebook.  Patient learns how to incorporate Pritikin recommendations into their lifestyle. Recommendations include planning and keeping personal health goals in mind as an important part of their success.  Healthy Mind-Set    Healthy Minds, Bodies, Hearts  Clinical staff conducted group or individual video education with verbal and written material and guidebook.  Patient learns how to identify when they are stressed. Video will discuss the impact of that stress, as well as the many benefits of stress management. Patient will also be introduced to stress management techniques. The way we think, act, and feel has an impact on our hearts.  How Our Thoughts Can Heal Our Hearts  Clinical staff conducted group or individual video education with verbal and written material and guidebook.  Patient learns that negative thoughts can cause depression and anxiety. This can result in negative lifestyle behavior and serious health problems.  Cognitive behavioral therapy is an effective method to help control our thoughts in order to change and improve our emotional outlook.  Additional Videos:  Exercise    Improving Performance  Clinical staff conducted group or individual video education with verbal and written material and guidebook.  Patient learns to use a non-linear approach by alternating intensity levels and lengths of time spent exercising to help burn more calories and lose more body fat. Cardiovascular exercise helps improve heart health, metabolism, hormonal balance, blood sugar control, and recovery from fatigue. Resistance training improves strength, endurance, balance, coordination, reaction time, metabolism, and muscle mass. Flexibility exercise improves circulation, posture, and balance. Seek guidance from your physician and exercise physiologist before implementing an exercise routine and learn your capabilities and proper form for all exercise.  Introduction to Yoga  Clinical staff conducted group or individual video education with verbal and written material and guidebook.  Patient learns about yoga, a discipline of the coming together of mind, breath, and body. The benefits of yoga include improved flexibility, improved range of motion, better posture and core strength, increased lung function, weight loss, and positive self-image. Yogas heart health benefits include lowered blood pressure, healthier heart rate, decreased cholesterol and triglyceride levels, improved immune function, and reduced stress. Seek guidance from your physician and exercise physiologist before implementing an exercise routine and learn your capabilities and proper form for all exercise.  Medical   Aging: Enhancing Your Quality of Life  Clinical staff conducted group or individual video  education with verbal and written material and guidebook.  Patient learns key strategies and recommendations to stay in good physical health and enhance  quality of life, such as prevention strategies, having an advocate, securing a Health Care Proxy and Power of Attorney, and keeping a list of medications and system for tracking them. It also discusses how to avoid risk for bone loss.  Biology of Weight Control  Clinical staff conducted group or individual video education with verbal and written material and guidebook.  Patient learns that weight gain occurs because we consume more calories than we burn (eating more, moving less). Even if your body weight is normal, you may have higher ratios of fat compared to muscle mass. Too much body fat puts you at increased risk for cardiovascular disease, heart attack, stroke, type 2 diabetes, and obesity-related cancers. In addition to exercise, following the Pritikin Eating Plan can help reduce your risk.  Decoding Lab Results  Clinical staff conducted group or individual video education with verbal and written material and guidebook.  Patient learns that lab test reflects one measurement whose values change over time and are influenced by many factors, including medication, stress, sleep, exercise, food, hydration, pre-existing medical conditions, and more. It is recommended to use the knowledge from this video to become more involved with your lab results and evaluate your numbers to speak with your doctor.   Diseases of Our Time - Overview  Clinical staff conducted group or individual video education with verbal and written material and guidebook.  Patient learns that according to the CDC, 50% to 70% of chronic diseases (such as obesity, type 2 diabetes, elevated lipids, hypertension, and heart disease) are avoidable through lifestyle improvements including healthier food choices, listening to satiety cues, and increased physical activity.  Sleep Disorders Clinical staff conducted group or individual video education with verbal and written material and guidebook.  Patient learns how good quality and  duration of sleep are important to overall health and well-being. Patient also learns about sleep disorders and how they impact health along with recommendations to address them, including discussing with a physician.  Nutrition  Dining Out - Part 2 Clinical staff conducted group or individual video education with verbal and written material and guidebook.  Patient learns how to plan ahead and communicate in order to maximize their dining experience in a healthy and nutritious manner. Included are recommended food choices based on the type of restaurant the patient is visiting.   Fueling a Banker conducted group or individual video education with verbal and written material and guidebook.  There is a strong connection between our food choices and our health. Diseases like obesity and type 2 diabetes are very prevalent and are in large-part due to lifestyle choices. The Pritikin Eating Plan provides plenty of food and hunger-curbing satisfaction. It is easy to follow, affordable, and helps reduce health risks.  Menu Workshop  Clinical staff conducted group or individual video education with verbal and written material and guidebook.  Patient learns that restaurant meals can sabotage health goals because they are often packed with calories, fat, sodium, and sugar. Recommendations include strategies to plan ahead and to communicate with the manager, chef, or server to help order a healthier meal.  Planning Your Eating Strategy  Clinical staff conducted group or individual video education with verbal and written material and guidebook.  Patient learns about the Pritikin Eating Plan and its benefit of reducing the risk of disease. The Pritikin Eating Plan does  not focus on calories. Instead, it emphasizes high-quality, nutrient-rich foods. By knowing the characteristics of the foods, we choose, we can determine their calorie density and make informed decisions.  Targeting Your  Nutrition Priorities  Clinical staff conducted group or individual video education with verbal and written material and guidebook.  Patient learns that lifestyle habits have a tremendous impact on disease risk and progression. This video provides eating and physical activity recommendations based on your personal health goals, such as reducing LDL cholesterol, losing weight, preventing or controlling type 2 diabetes, and reducing high blood pressure.  Vitamins and Minerals  Clinical staff conducted group or individual video education with verbal and written material and guidebook.  Patient learns different ways to obtain key vitamins and minerals, including through a recommended healthy diet. It is important to discuss all supplements you take with your doctor.   Healthy Mind-Set    Smoking Cessation  Clinical staff conducted group or individual video education with verbal and written material and guidebook.  Patient learns that cigarette smoking and tobacco addiction pose a serious health risk which affects millions of people. Stopping smoking will significantly reduce the risk of heart disease, lung disease, and many forms of cancer. Recommended strategies for quitting are covered, including working with your doctor to develop a successful plan.  Culinary   Becoming a Set Designer conducted group or individual video education with verbal and written material and guidebook.  Patient learns that cooking at home can be healthy, cost-effective, quick, and puts them in control. Keys to cooking healthy recipes will include looking at your recipe, assessing your equipment needs, planning ahead, making it simple, choosing cost-effective seasonal ingredients, and limiting the use of added fats, salts, and sugars.  Cooking - Breakfast and Snacks  Clinical staff conducted group or individual video education with verbal and written material and guidebook.  Patient learns how important  breakfast is to satiety and nutrition through the entire day. Recommendations include key foods to eat during breakfast to help stabilize blood sugar levels and to prevent overeating at meals later in the day. Planning ahead is also a key component.  Cooking - Educational Psychologist conducted group or individual video education with verbal and written material and guidebook.  Patient learns eating strategies to improve overall health, including an approach to cook more at home. Recommendations include thinking of animal protein as a side on your plate rather than center stage and focusing instead on lower calorie dense options like vegetables, fruits, whole grains, and plant-based proteins, such as beans. Making sauces in large quantities to freeze for later and leaving the skin on your vegetables are also recommended to maximize your experience.  Cooking - Healthy Salads and Dressing Clinical staff conducted group or individual video education with verbal and written material and guidebook.  Patient learns that vegetables, fruits, whole grains, and legumes are the foundations of the Pritikin Eating Plan. Recommendations include how to incorporate each of these in flavorful and healthy salads, and how to create homemade salad dressings. Proper handling of ingredients is also covered. Cooking - Soups and [employer] - Soups and Desserts Clinical staff conducted group or individual video education with verbal and written material and guidebook.  Patient learns that Pritikin soups and desserts make for easy, nutritious, and delicious snacks and meal components that are low in sodium, fat, sugar, and calorie density, while high in vitamins, minerals, and filling fiber. Recommendations include simple and healthy ideas  for soups and desserts.   Overview     The Pritikin Solution Program Overview Clinical staff conducted group or individual video education with verbal and written material  and guidebook.  Patient learns that the results of the Pritikin Program have been documented in more than 100 articles published in peer-reviewed journals, and the benefits include reducing risk factors for (and, in some cases, even reversing) high cholesterol, high blood pressure, type 2 diabetes, obesity, and more! An overview of the three key pillars of the Pritikin Program will be covered: eating well, doing regular exercise, and having a healthy mind-set.  WORKSHOPS  Exercise: Exercise Basics: Building Your Action Plan Clinical staff led group instruction and group discussion with PowerPoint presentation and patient guidebook. To enhance the learning environment the use of posters, models and videos may be added. At the conclusion of this workshop, patients will comprehend the difference between physical activity and exercise, as well as the benefits of incorporating both, into their routine. Patients will understand the FITT (Frequency, Intensity, Time, and Type) principle and how to use it to build an exercise action plan. In addition, safety concerns and other considerations for exercise and cardiac rehab will be addressed by the presenter. The purpose of this lesson is to promote a comprehensive and effective weekly exercise routine in order to improve patients overall level of fitness.   Managing Heart Disease: Your Path to a Healthier Heart Clinical staff led group instruction and group discussion with PowerPoint presentation and patient guidebook. To enhance the learning environment the use of posters, models and videos may be added.At the conclusion of this workshop, patients will understand the anatomy and physiology of the heart. Additionally, they will understand how Pritikins three pillars impact the risk factors, the progression, and the management of heart disease.  The purpose of this lesson is to provide a high-level overview of the heart, heart disease, and how the Pritikin  lifestyle positively impacts risk factors.  Exercise Biomechanics Clinical staff led group instruction and group discussion with PowerPoint presentation and patient guidebook. To enhance the learning environment the use of posters, models and videos may be added. Patients will learn how the structural parts of their bodies function and how these functions impact their daily activities, movement, and exercise. Patients will learn how to promote a neutral spine, learn how to manage pain, and identify ways to improve their physical movement in order to promote healthy living. The purpose of this lesson is to expose patients to common physical limitations that impact physical activity. Participants will learn practical ways to adapt and manage aches and pains, and to minimize their effect on regular exercise. Patients will learn how to maintain good posture while sitting, walking, and lifting.  Balance Training and Fall Prevention  Clinical staff led group instruction and group discussion with PowerPoint presentation and patient guidebook. To enhance the learning environment the use of posters, models and videos may be added. At the conclusion of this workshop, patients will understand the importance of their sensorimotor skills (vision, proprioception, and the vestibular system) in maintaining their ability to balance as they age. Patients will apply a variety of balancing exercises that are appropriate for their current level of function. Patients will understand the common causes for poor balance, possible solutions to these problems, and ways to modify their physical environment in order to minimize their fall risk. The purpose of this lesson is to teach patients about the importance of maintaining balance as they age and ways to minimize  their risk of falling.  WORKSHOPS   Nutrition:  Fueling a Ship Broker led group instruction and group discussion with PowerPoint presentation  and patient guidebook. To enhance the learning environment the use of posters, models and videos may be added. Patients will review the foundational principles of the Pritikin Eating Plan and understand what constitutes a serving size in each of the food groups. Patients will also learn Pritikin-friendly foods that are better choices when away from home and review make-ahead meal and snack options. Calorie density will be reviewed and applied to three nutrition priorities: weight maintenance, weight loss, and weight gain. The purpose of this lesson is to reinforce (in a group setting) the key concepts around what patients are recommended to eat and how to apply these guidelines when away from home by planning and selecting Pritikin-friendly options. Patients will understand how calorie density may be adjusted for different weight management goals.  Mindful Eating  Clinical staff led group instruction and group discussion with PowerPoint presentation and patient guidebook. To enhance the learning environment the use of posters, models and videos may be added. Patients will briefly review the concepts of the Pritikin Eating Plan and the importance of low-calorie dense foods. The concept of mindful eating will be introduced as well as the importance of paying attention to internal hunger signals. Triggers for non-hunger eating and techniques for dealing with triggers will be explored. The purpose of this lesson is to provide patients with the opportunity to review the basic principles of the Pritikin Eating Plan, discuss the value of eating mindfully and how to measure internal cues of hunger and fullness using the Hunger Scale. Patients will also discuss reasons for non-hunger eating and learn strategies to use for controlling emotional eating.  Targeting Your Nutrition Priorities Clinical staff led group instruction and group discussion with PowerPoint presentation and patient guidebook. To enhance the  learning environment the use of posters, models and videos may be added. Patients will learn how to determine their genetic susceptibility to disease by reviewing their family history. Patients will gain insight into the importance of diet as part of an overall healthy lifestyle in mitigating the impact of genetics and other environmental insults. The purpose of this lesson is to provide patients with the opportunity to assess their personal nutrition priorities by looking at their family history, their own health history and current risk factors. Patients will also be able to discuss ways of prioritizing and modifying the Pritikin Eating Plan for their highest risk areas  Menu  Clinical staff led group instruction and group discussion with PowerPoint presentation and patient guidebook. To enhance the learning environment the use of posters, models and videos may be added. Using menus brought in from [employer], or printed from [employer], patients will apply the Pritikin dining out guidelines that were presented in the [employer] video. Patients will also be able to practice these guidelines in a variety of provided scenarios. The purpose of this lesson is to provide patients with the opportunity to practice hands-on learning of the Pritikin Dining Out guidelines with actual menus and practice scenarios.  Label Reading Clinical staff led group instruction and group discussion with PowerPoint presentation and patient guidebook. To enhance the learning environment the use of posters, models and videos may be added. Patients will review and discuss the Pritikin label reading guidelines presented in Pritikins Label Reading Educational series video. Using fool labels brought in from local grocery stores and markets, patients will  apply the label reading guidelines and determine if the packaged food meet the Pritikin guidelines. The purpose of this lesson is to provide patients with  the opportunity to review, discuss, and practice hands-on learning of the Pritikin Label Reading guidelines with actual packaged food labels. Cooking School  Pritikins [employer] are designed to teach patients ways to prepare quick, simple, and affordable recipes at home. The importance of nutritions role in chronic disease risk reduction is reflected in its emphasis in the overall Pritikin program. By learning how to prepare essential core Pritikin Eating Plan recipes, patients will increase control over what they eat; be able to customize the flavor of foods without the use of added salt, sugar, or fat; and improve the quality of the food they consume. By learning a set of core recipes which are easily assembled, quickly prepared, and affordable, patients are more likely to prepare more healthy foods at home. These workshops focus on convenient breakfasts, simple entres, side dishes, and desserts which can be prepared with minimal effort and are consistent with nutrition recommendations for cardiovascular risk reduction. Cooking [employer] are taught by a armed forces logistics/support/administrative officer (RD) who has been trained by the [employer]. The chef or RD has a clear understanding of the importance of minimizing - if not completely eliminating - added fat, sugar, and sodium in recipes. Throughout the series of Cooking School Workshop sessions, patients will learn about healthy ingredients and efficient methods of cooking to build confidence in their capability to prepare    Cooking School weekly topics:  Adding Flavor- Sodium-Free  Fast and Healthy Breakfasts  Powerhouse Plant-Based Proteins  Satisfying Salads and Dressings  Simple Sides and Sauces  International Cuisine-Spotlight on the [employer] Zones  Delicious Desserts  Savory Soups  [employer] - Meals in a Astronomer Appetizers and Snacks  Comforting Weekend Breakfasts  One-Pot Wonders   Fast Evening  Meals  Landscape Architect Your Pritikin Plate  WORKSHOPS   Healthy Mindset (Psychosocial):  Focused Goals, Sustainable Changes Clinical staff led group instruction and group discussion with PowerPoint presentation and patient guidebook. To enhance the learning environment the use of posters, models and videos may be added. Patients will be able to apply effective goal setting strategies to establish at least one personal goal, and then take consistent, meaningful action toward that goal. They will learn to identify common barriers to achieving personal goals and develop strategies to overcome them. Patients will also gain an understanding of how our mind-set can impact our ability to achieve goals and the importance of cultivating a positive and growth-oriented mind-set. The purpose of this lesson is to provide patients with a deeper understanding of how to set and achieve personal goals, as well as the tools and strategies needed to overcome common obstacles which may arise along the way.  From Head to Heart: The Power of a Healthy Outlook  Clinical staff led group instruction and group discussion with PowerPoint presentation and patient guidebook. To enhance the learning environment the use of posters, models and videos may be added. Patients will be able to recognize and describe the impact of emotions and mood on physical health. They will discover the importance of self-care and explore self-care practices which may work for them. Patients will also learn how to utilize the 4 Cs to cultivate a healthier outlook and better manage stress and challenges. The purpose of this lesson is to demonstrate to patients how a healthy outlook is  an essential part of maintaining good health, especially as they continue their cardiac rehab journey.  Healthy Sleep for a Healthy Heart Clinical staff led group instruction and group discussion with PowerPoint presentation and patient guidebook. To  enhance the learning environment the use of posters, models and videos may be added. At the conclusion of this workshop, patients will be able to demonstrate knowledge of the importance of sleep to overall health, well-being, and quality of life. They will understand the symptoms of, and treatments for, common sleep disorders. Patients will also be able to identify daytime and nighttime behaviors which impact sleep, and they will be able to apply these tools to help manage sleep-related challenges. The purpose of this lesson is to provide patients with a general overview of sleep and outline the importance of quality sleep. Patients will learn about a few of the most common sleep disorders. Patients will also be introduced to the concept of sleep hygiene, and discover ways to self-manage certain sleeping problems through simple daily behavior changes. Finally, the workshop will motivate patients by clarifying the links between quality sleep and their goals of heart-healthy living.   Recognizing and Reducing Stress Clinical staff led group instruction and group discussion with PowerPoint presentation and patient guidebook. To enhance the learning environment the use of posters, models and videos may be added. At the conclusion of this workshop, patients will be able to understand the types of stress reactions, differentiate between acute and chronic stress, and recognize the impact that chronic stress has on their health. They will also be able to apply different coping mechanisms, such as reframing negative self-talk. Patients will have the opportunity to practice a variety of stress management techniques, such as deep abdominal breathing, progressive muscle relaxation, and/or guided imagery.  The purpose of this lesson is to educate patients on the role of stress in their lives and to provide healthy techniques for coping with it.  Learning Barriers/Preferences:  Learning Barriers/Preferences - 09/29/24  1559       Learning Barriers/Preferences   Learning Barriers Exercise Concerns   Poor balance   Learning Preferences Audio;Computer/Internet;Group Instruction;Pictoral;Individual Instruction;Skilled Demonstration;Verbal Instruction;Video;Written Material          Education Topics:  Knowledge Questionnaire Score:  Knowledge Questionnaire Score - 09/29/24 1403       Knowledge Questionnaire Score   Pre Score 21/24          Core Components/Risk Factors/Patient Goals at Admission:  Personal Goals and Risk Factors at Admission - 09/29/24 1354       Core Components/Risk Factors/Patient Goals on Admission    Weight Management Yes;Weight Maintenance    Intervention Weight Management: Develop a combined nutrition and exercise program designed to reach desired caloric intake, while maintaining appropriate intake of nutrient and fiber, sodium and fats, and appropriate energy expenditure required for the weight goal.;Weight Management: Provide education and appropriate resources to help participant work on and attain dietary goals.    Admit Weight 151 lb 7.3 oz (68.7 kg)    Hypertension Yes    Intervention Provide education on lifestyle modifcations including regular physical activity/exercise, weight management, moderate sodium restriction and increased consumption of fresh fruit, vegetables, and low fat dairy, alcohol  moderation, and smoking cessation.;Monitor prescription use compliance.    Expected Outcomes Short Term: Continued assessment and intervention until BP is < 140/11mm HG in hypertensive participants. < 130/33mm HG in hypertensive participants with diabetes, heart failure or chronic kidney disease.;Long Term: Maintenance of blood pressure at goal levels.  Lipids Yes    Intervention Provide education and support for participant on nutrition & aerobic/resistive exercise along with prescribed medications to achieve LDL 70mg , HDL >40mg .    Expected Outcomes Short Term:  Participant states understanding of desired cholesterol values and is compliant with medications prescribed. Participant is following exercise prescription and nutrition guidelines.;Long Term: Cholesterol controlled with medications as prescribed, with individualized exercise RX and with personalized nutrition plan. Value goals: LDL < 70mg , HDL > 40 mg.    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 Introduction to pritikin education/intensive cardiac rehab. Initial orientation packet reviewed with patient.          Comments: Participant attended orientation for the cardiac rehabilitation program on  09/29/2024  to perform initial intake and exercise walk test. Patient introduced to the Pritikin Program education and orientation packet was reviewed. Completed 6-minute walk test, measurements, initial ITP, and exercise prescription. Vital signs stable. Telemetry-normal sinus rhythm, asymptomatic.   Service time was from 1314 to 1530.        [1]  Current Outpatient Medications:    acetaminophen  (TYLENOL ) 325 MG tablet, Take 325 mg by mouth in the morning and at bedtime., Disp: , Rfl:    Adalimumab-bwwd (HADLIMA PUSHTOUCH) 40 MG/0.4ML SOAJ, Inject 40 mg into the skin every 14 (fourteen) days., Disp: , Rfl:    apixaban  (ELIQUIS ) 5 MG TABS tablet, Take 1 tablet (5 mg total) by mouth 2 (two) times daily., Disp: 180 tablet, Rfl: 3   Bacillus Coagulans-Inulin (ALIGN PREBIOTIC-PROBIOTIC) 5-1.25 MG-GM CHEW, Chew 1 Dose by mouth daily., Disp: , Rfl:     Cholecalciferol (VITAMIN D3) 1.25 MG (50000 UT) CAPS, Take 1 capsule by mouth once a week., Disp: , Rfl:    Cyanocobalamin  (VITAMIN B-12 PO), Take 2.5 mg by mouth daily., Disp: , Rfl:    folic acid  (FOLVITE ) 400 MCG tablet, Take 400 mcg by mouth daily., Disp: , Rfl:    Magnesium 400 MG TABS, Take 1 tablet by mouth daily in the afternoon., Disp: , Rfl:    metoprolol  succinate (TOPROL -XL) 25 MG 24 hr tablet, Take 1 tablet (25 mg total) by mouth 2 (two) times daily., Disp: 180 tablet, Rfl: 3   Multiple Vitamins-Iron (MULTIVITAMIN/IRON PO), Take 1 tablet by mouth daily., Disp: , Rfl:    nitrofurantoin (MACRODANTIN) 100 MG capsule, Take 100 mg by mouth in the morning., Disp: , Rfl:    rosuvastatin  (CRESTOR ) 10 MG tablet, Take 1 tablet (10 mg total) by mouth daily., Disp: 90 tablet, Rfl: 3   Turmeric (QC TUMERIC COMPLEX) 500 MG CAPS, Take 500 mg by mouth daily., Disp: , Rfl:    adalimumab (HUMIRA, 2 PEN,) 40 MG/0.4ML pen, Inject 40 mg into the skin every 14 (fourteen) days. Saturday's (Patient not taking: Reported on 09/29/2024), Disp: , Rfl:    budesonide  (ENTOCORT EC ) 3 MG 24 hr capsule, Take 3 capsules (9 mg total) by mouth daily. (Patient taking differently: Take  3 mg by mouth daily.), Disp: 90 capsule, Rfl: 3   HUMIRA, 2 SYRINGE, 40 MG/0.4ML prefilled syringe, Inject into the skin. (Patient not taking: Reported on 09/29/2024), Disp: , Rfl:    irbesartan  (AVAPRO ) 300 MG tablet, Take 1 tablet (300 mg total) by mouth daily., Disp: 90 tablet, Rfl: 3   RESTASIS  0.05 % ophthalmic emulsion, Place 1 drop into both eyes 2 (two) times daily. (Patient not taking: Reported on 09/29/2024), Disp: , Rfl:    TYRVAYA  0.03 MG/ACT SOLN, Place 1 drop into both eyes 2 (two) times daily. (Patient not taking: Reported on 09/29/2024), Disp: , Rfl:  [2]  Social History Tobacco Use  Smoking Status Former   Current packs/day: 0.00   Types: Cigarettes   Quit date: 09/23/1983   Years since quitting: 41.0  Smokeless Tobacco Never

## 2024-10-05 ENCOUNTER — Encounter (HOSPITAL_COMMUNITY)
Admission: RE | Admit: 2024-10-05 | Discharge: 2024-10-05 | Disposition: A | Discharge status: Home / Self Care | Source: Ambulatory Visit | Attending: Cardiovascular Disease | Admitting: Cardiovascular Disease

## 2024-10-05 DIAGNOSIS — Z48812 Encounter for surgical aftercare following surgery on the circulatory system: Secondary | ICD-10-CM | POA: Diagnosis not present

## 2024-10-05 DIAGNOSIS — I214 Non-ST elevation (NSTEMI) myocardial infarction: Secondary | ICD-10-CM

## 2024-10-05 NOTE — Progress Notes (Signed)
 Daily Session Note  Patient Details  Name: Loretta Austin MRN: [id number] Date of Birth: 16-Oct-1945 Referring Provider:   Flowsheet Row INTENSIVE CARDIAC REHAB ORIENT from 09/29/2024 in [facility]  Referring Provider Dr. Jerel Balding MD    Encounter Date: 10/05/2024  Check In:  Session Check In - 10/05/24 1005       Check-In   Supervising physician immediately available to respond to emergencies [facility] MD immediately available    Physician(s) Rosaline Bane, NP    Location [facility]    Virtual Visit No    Medication changes reported     No    Fall or balance concerns reported    No    Tobacco Cessation No Change    Warm-up and Cool-down Performed as group-led instruction    Resistance Training Performed No    VAD Patient? No    PAD/SET Patient? No      Pain Assessment   Currently in Pain? No/denies    Pain Score 0-No pain    Multiple Pain Sites No          Capillary Blood Glucose: No results found for this or any previous visit (from the past 24 hours).   Exercise Prescription Changes - 10/05/24 1004       Response to Exercise   Blood Pressure (Admit) 142/80    Blood Pressure (Exercise) 148/72    Blood Pressure (Exit) 102/64    Heart Rate (Admit) 71 bpm    Heart Rate (Exercise) 95 bpm    Heart Rate (Exit) 74 bpm    Rating of Perceived Exertion (Exercise) 11    Symptoms None    Comments Off to a good start with exercise. Switched from recumbent stepper to arm ergometer due to sciatic nerve pain.    Duration Continue with 30 min of aerobic exercise without signs/symptoms of physical distress.    Intensity THRR unchanged      Progression   Progression Continue to progress workloads to maintain intensity without signs/symptoms of physical distress.    Average METs 1.6      Resistance Training   Training Prescription No    Weight Relaxation day. No weights.      Recumbant Bike    Level 1    RPM 76    Watts 20    Minutes 15    METs 1.7      Arm Ergometer   Level 1    Watts 7    Minutes 15    METs 1.5          Tobacco Use History[1]  Goals Met:  Exercise tolerated well  Goals Unmet:  Not Applicable  Comments: Loretta Austin tolerated first session of exercise well without symptoms. Exercise equipment switched from recumbent stepper and track to the arm ergometer and recumbent bike due to sciatic nerve pain. Will continue to monitor and adjust equipment as needed. Telemetry normal sinus rhythm with rare PVCs, asymptomatic, and blood pressure mildly elevated at check, but within normal limits for exercise and at recovery.   Dr. Wilbert Bihari is Medical Director for Cardiac Rehab at [facility].    [1]  Social History Tobacco Use  Smoking Status Former   Current packs/day: 0.00   Types: Cigarettes   Quit date: 09/23/1983   Years since quitting: 41.0  Smokeless Tobacco Never

## 2024-10-07 ENCOUNTER — Encounter (HOSPITAL_COMMUNITY)
Admission: RE | Admit: 2024-10-07 | Discharge: 2024-10-07 | Disposition: A | Discharge status: Home / Self Care | Source: Ambulatory Visit | Attending: Cardiovascular Disease | Admitting: Cardiovascular Disease

## 2024-10-07 DIAGNOSIS — I214 Non-ST elevation (NSTEMI) myocardial infarction: Secondary | ICD-10-CM

## 2024-10-07 DIAGNOSIS — Z48812 Encounter for surgical aftercare following surgery on the circulatory system: Secondary | ICD-10-CM | POA: Diagnosis not present

## 2024-10-10 ENCOUNTER — Encounter (HOSPITAL_COMMUNITY)
Admission: RE | Admit: 2024-10-10 | Discharge: 2024-10-10 | Disposition: A | Source: Ambulatory Visit | Attending: Cardiovascular Disease

## 2024-10-10 ENCOUNTER — Encounter: Payer: Self-pay | Admitting: Family Medicine

## 2024-10-10 ENCOUNTER — Ambulatory Visit: Admitting: Family Medicine

## 2024-10-10 VITALS — BP 116/72 | HR 72 | Temp 97.7°F | Ht 65.0 in | Wt 150.0 lb

## 2024-10-10 DIAGNOSIS — Z7689 Persons encountering health services in other specified circumstances: Secondary | ICD-10-CM | POA: Diagnosis not present

## 2024-10-10 DIAGNOSIS — R232 Flushing: Secondary | ICD-10-CM

## 2024-10-10 DIAGNOSIS — R5383 Other fatigue: Secondary | ICD-10-CM | POA: Diagnosis not present

## 2024-10-10 DIAGNOSIS — I214 Non-ST elevation (NSTEMI) myocardial infarction: Secondary | ICD-10-CM

## 2024-10-10 DIAGNOSIS — E785 Hyperlipidemia, unspecified: Secondary | ICD-10-CM | POA: Diagnosis not present

## 2024-10-10 DIAGNOSIS — M5430 Sciatica, unspecified side: Secondary | ICD-10-CM | POA: Diagnosis not present

## 2024-10-10 DIAGNOSIS — R7989 Other specified abnormal findings of blood chemistry: Secondary | ICD-10-CM | POA: Diagnosis not present

## 2024-10-10 DIAGNOSIS — E559 Vitamin D deficiency, unspecified: Secondary | ICD-10-CM

## 2024-10-10 DIAGNOSIS — Z48812 Encounter for surgical aftercare following surgery on the circulatory system: Secondary | ICD-10-CM | POA: Diagnosis not present

## 2024-10-10 NOTE — Progress Notes (Unsigned)
 "  New Patient Office Visit  Subjective   Patient ID: Loretta Austin, female    DOB: [birth year]  Age: 79 y.o. MRN: [id number]  CC:  Chief Complaint  Patient presents with   Establish Care    HPI Loretta Austin presents to establish care with new provider.  Patients previous primary care provider: [facility], Dr. Toribio Shan. Last seen December 2025.   Specialist: [facility] [employer] with Dr. Jerel Croitoru with cardiac rehabilitation.  [facility]-Dr. Lilyan Jacob [facility] with Dr. Victory Brand  [facility].  [facility]  Oneil Forget DDS   Patient is concerned about her thyroid  screening with a previous elevated TSH 5.146 and T3 214 back in November 2025. She was taking Biotin, but has stopped taking it to obtain a true representation of her thyroid  levels.   Patient complains of right sciatica pain. She reports she has been taking Tylenol  325mg  twice a day for the pain. Also, has an upcoming physical therapy appointment with [facility] to see if this well help.   Last, complains of hot flashes. She reports these episodes usually occur during the early morning, that started after starting Tyrvaya  for dry eyes with her ophthalmology. She has stopped medications with other symptoms and new onset of A-fib started back in November 2025. She has lack of energy, fatigue. However, since stopping medication hot flashes are slowly improving, not as bad as previously.   Patient is under the care of cardiology, for concern of a NSTEMI versus more likely Takotsubo cardiomyopathy. A-fib, CAD, HTN, and HLP. Mostly recently, increased Irbesartan  to 300mg  with repeating echocardiogram and completing cardio rehab.  Outpatient Encounter Medications as of 10/10/2024  Medication Sig   acetaminophen  (TYLENOL ) 325 MG tablet Take 325 mg by mouth in the morning and at bedtime.   Adalimumab-bwwd (HADLIMA PUSHTOUCH) 40  MG/0.4ML SOAJ Inject 40 mg into the skin every 14 (fourteen) days.   apixaban  (ELIQUIS ) 5 MG TABS tablet Take 1 tablet (5 mg total) by mouth 2 (two) times daily.   Bacillus Coagulans-Inulin (ALIGN PREBIOTIC-PROBIOTIC) 5-1.25 MG-GM CHEW Chew 1 Dose by mouth daily.   Bone Tissue Allograft, Human, (OSTEOCONDUCTIVE MATRIX PLUS IJ) Inject as directed.   budesonide  (ENTOCORT EC ) 3 MG 24 hr capsule Take 3 capsules (9 mg total) by mouth daily. (Patient taking differently: Take 3 mg by mouth daily.)   Cholecalciferol (VITAMIN D3) 1.25 MG (50000 UT) CAPS Take 1 capsule by mouth once a week.   Cyanocobalamin  (VITAMIN B-12 PO) Take 2.5 mg by mouth daily.   folic acid  (FOLVITE ) 400 MCG tablet Take 400 mcg by mouth daily.   HUMIRA, 2 SYRINGE, 40 MG/0.4ML prefilled syringe Inject into the skin.   irbesartan  (AVAPRO ) 300 MG tablet Take 1 tablet (300 mg total) by mouth daily.   Lifitegrast (XIIDRA OP) Apply 0.2 mLs to eye 2 (two) times daily.   Magnesium 400 MG TABS Take 1 tablet by mouth daily in the afternoon.   melatonin 5 MG TABS Take 5 mg by mouth.   metoprolol  succinate (TOPROL -XL) 25 MG 24 hr tablet Take 1 tablet (25 mg total) by mouth 2 (two) times daily.   Multiple Vitamins-Iron (MULTIVITAMIN/IRON PO) Take 1 tablet by mouth daily.   nitrofurantoin (MACRODANTIN) 100 MG capsule Take 100 mg by mouth in the morning.   rosuvastatin  (CRESTOR ) 10 MG tablet Take 1 tablet (10 mg total) by mouth daily.   Turmeric (QC TUMERIC COMPLEX) 500 MG CAPS Take 500 mg  by mouth daily.   adalimumab (HUMIRA, 2 PEN,) 40 MG/0.4ML pen Inject 40 mg into the skin every 14 (fourteen) days. Saturday's (Patient not taking: Reported on 10/10/2024)   [DISCONTINUED] RESTASIS  0.05 % ophthalmic emulsion Place 1 drop into both eyes 2 (two) times daily. (Patient not taking: Reported on 10/10/2024)   [DISCONTINUED] TYRVAYA  0.03 MG/ACT SOLN Place 1 drop into both eyes 2 (two) times daily. (Patient not taking: Reported on 10/10/2024)   No  facility-administered encounter medications on file as of 10/10/2024.    Past Medical History:  Diagnosis Date   Allergy    Arthritis    Cholecystolithiasis    Colon polyps    Diverticulosis    Emphysema of lung (HCC) 10/25   Food poisoning    GERD (gastroesophageal reflux disease)    Heart murmur    High blood pressure    Hyperlipidemia    Microscopic colitis    Osteoporosis    PAD (peripheral artery disease) 01/16/2023   Lower extremity arterial Dopplers 02/06/2023: Right-no stenosis, mild-moderate atherosclerosis throughout especially in CFA and calf vessels  ABIs 02/06/2023: Right 0.91, left 1.12   Sjogren's disease    Sleep apnea    wears appliance    Past Surgical History:  Procedure Laterality Date   COLONOSCOPY  04/19/2021   02/12/16-at [place]   CORONARY PRESSURE/FFR STUDY N/A 08/15/2024   Procedure: CORONARY PRESSURE/FFR STUDY;  Surgeon: Elmira Newman PARAS, MD;  Location: [facility];  Service: Cardiovascular;  Laterality: N/A;   LEFT HEART CATH AND CORONARY ANGIOGRAPHY N/A 08/15/2024   Procedure: LEFT HEART CATH AND CORONARY ANGIOGRAPHY;  Surgeon: Elmira Newman PARAS, MD;  Location: [facility];  Service: Cardiovascular;  Laterality: N/A;   TONSILLECTOMY AND ADENOIDECTOMY     TUBAL LIGATION      Family History  Problem Relation Age of Onset   Lung cancer Mother    Colon cancer Mother 67   Cancer Mother    Arthritis Mother    Hypertension Father    Cancer Sister    Esophageal cancer Neg Hx    Rectal cancer Neg Hx    Stomach cancer Neg Hx    Colon polyps Neg Hx     Social History   Socioeconomic History   Marital status: Widowed    Spouse name: Not on file   Number of children: 1   Years of education: Not on file   Highest education level: Bachelor's degree (e.g., BA, AB, BS)  Occupational History   Occupation: retired  Tobacco Use   Smoking status: Former    Current packs/day: 0.00    Average packs/day: 3.0 packs/day for 20.0  years (60.0 ttl pk-yrs)    Types: Cigarettes    Quit date: 09/23/1983    Years since quitting: 41.0   Smokeless tobacco: Never  Vaping Use   Vaping status: Never Used  Substance and Sexual Activity   Alcohol  use: Not Currently    Alcohol /week: 1.0 standard drink of alcohol     Types: 1 Standard drinks or equivalent per week    Comment: occ   Drug use: Not Currently    Types: Marijuana   Sexual activity: Not Currently    Partners: Male    Birth control/protection: None  Other Topics Concern   Not on file  Social History Narrative   Not on file   Social Drivers of Health   Tobacco Use: Medium Risk (10/10/2024)   Patient History    Smoking Tobacco Use: Former  Smokeless Tobacco Use: Never    Passive Exposure: Not on file  Financial Resource Strain: Low Risk (10/06/2024)   Overall Financial Resource Strain (CARDIA)    Difficulty of Paying Living Expenses: Not hard at all  Food Insecurity: No Food Insecurity (10/06/2024)   Epic    Worried About Programme Researcher, Broadcasting/film/video in the Last Year: Never true    Ran Out of Food in the Last Year: Never true  Transportation Needs: No Transportation Needs (10/06/2024)   Epic    Lack of Transportation (Medical): No    Lack of Transportation (Non-Medical): No  Physical Activity: Insufficiently Active (10/06/2024)   Exercise Vital Sign    Days of Exercise per Week: 2 days    Minutes of Exercise per Session: 20 min  Stress: No Stress Concern Present (10/06/2024)   [employer] of Occupational Health - Occupational Stress Questionnaire    Feeling of Stress: Not at all  Social Connections: Moderately Isolated (10/06/2024)   Social Connection and Isolation Panel    Frequency of Communication with Friends and Family: More than three times a week    Frequency of Social Gatherings with Friends and Family: Twice a week    Attends Religious Services: Never    Database Administrator or Organizations: Yes    Attends Banker Meetings: Never     Marital Status: Widowed  Intimate Partner Violence: Not At Risk (08/13/2024)   Epic    Fear of Current or Ex-Partner: No    Emotionally Abused: No    Physically Abused: No    Sexually Abused: No  Depression (PHQ2-9): Low Risk (10/10/2024)   Depression (PHQ2-9)    PHQ-2 Score: 4  Alcohol  Screen: Low Risk (10/06/2024)   Alcohol  Screen    Last Alcohol  Screening Score (AUDIT): 1  Housing: Low Risk (10/06/2024)   Epic    Unable to Pay for Housing in the Last Year: No    Number of Times Moved in the Last Year: 0    Homeless in the Last Year: No  Utilities: Not At Risk (08/13/2024)   Epic    Threatened with loss of utilities: No  Health Literacy: Adequate Health Literacy (10/10/2024)   B1300 Health Literacy    Frequency of need for help with medical instructions: Never    ROS See HPI above    Objective  BP 116/72   Pulse 72   Temp 97.7 F (36.5 C) (Oral)   Ht 5' 5 (1.651 m)   Wt 150 lb (68 kg)   SpO2 98%   BMI 24.96 kg/m   Physical Exam Vitals reviewed.  Constitutional:      General: She is not in acute distress.    Appearance: Normal appearance. She is normal weight. She is not ill-appearing, toxic-appearing or diaphoretic.  HENT:     Head: Normocephalic and atraumatic.  Eyes:     General:        Right eye: No discharge.        Left eye: No discharge.     Conjunctiva/sclera: Conjunctivae normal.  Cardiovascular:     Rate and Rhythm: Normal rate and regular rhythm.     Heart sounds: Normal heart sounds. No murmur heard.    No friction rub. No gallop.  Pulmonary:     Effort: Pulmonary effort is normal. No respiratory distress.     Breath sounds: Normal breath sounds.  Musculoskeletal:        General: Normal range of motion.  Right lower leg: No edema.     Left lower leg: No edema.  Skin:    General: Skin is warm and dry.  Neurological:     General: No focal deficit present.     Mental Status: She is alert and oriented to person, place, and time. Mental  status is at baseline.  Psychiatric:        Mood and Affect: Mood normal.        Behavior: Behavior normal.        Thought Content: Thought content normal.        Judgment: Judgment normal.      Assessment & Plan:  Abnormal TSH -     TSH; Future -     T4, free; Future -     T3, free; Future  Fatigue, unspecified type -     CBC with Differential/Platelet; Future -     TSH; Future -     T4, free; Future -     T3, free; Future -     VITAMIN D  25 Hydroxy (Vit-D Deficiency, Fractures); Future -     Vitamin B12; Future  Vitamin D  deficiency -     VITAMIN D  25 Hydroxy (Vit-D Deficiency, Fractures); Future  Sciatic leg pain  Hot flashes  Hyperlipidemia LDL goal <70 -     Comprehensive metabolic panel with GFR; Future -     Lipid panel; Future  Encounter to establish care  1.Review health maintenance: -Covid booster: Early September either with [facility] or [facility]  -Hep C screening: May have it, will request records from previous PCP -Bone Density Scan: 2025, request records from previous PCP  -Tdap vaccine: Unknown -Mammogram: [facility]  2.Continue all medications.  3.Ordered labs. Please schedule a lab  appointment and be fasting. Office will call with lab results and will be available via MyChart.  4.Discussed about trying Fluoxetine (Prozac) if hot flashed continue.  5.Recommend to try Voltaren Cream; Lidoderm  patches; using heat/cold compresses 4-6 times a day, up to 20 minutes at a time; Tylenol  to help with sciatic pain.   Return in about 3 months (around 01/08/2025) for chronic management; lab appointment .   Judy Goodenow, NP "

## 2024-10-10 NOTE — Patient Instructions (Addendum)
-  It was a pleasure to meet you and look forward to taking care of you.  -Continue all medications.  -Ordered labs. Please schedule a lab  appointment and be fasting. Office will call with lab results and will be available via MyChart.  -Discussed about trying Fluoxetine (Prozac) if hot flashed continue.  -Recommend to try Voltaren Cream; Lidoderm  patches; using heat/cold compresses 4-6 times a day, up to 20 minutes at a time; Tylenol  to help with sciatic pain.  -Follow up in 3 months.

## 2024-10-11 ENCOUNTER — Ambulatory Visit: Payer: Self-pay | Admitting: Family Medicine

## 2024-10-11 ENCOUNTER — Other Ambulatory Visit (INDEPENDENT_AMBULATORY_CARE_PROVIDER_SITE_OTHER)

## 2024-10-11 DIAGNOSIS — R5383 Other fatigue: Secondary | ICD-10-CM | POA: Diagnosis not present

## 2024-10-11 DIAGNOSIS — R7989 Other specified abnormal findings of blood chemistry: Secondary | ICD-10-CM

## 2024-10-11 DIAGNOSIS — E559 Vitamin D deficiency, unspecified: Secondary | ICD-10-CM | POA: Diagnosis not present

## 2024-10-11 DIAGNOSIS — E785 Hyperlipidemia, unspecified: Secondary | ICD-10-CM

## 2024-10-11 LAB — CBC WITH DIFFERENTIAL/PLATELET
Basophils Absolute: 0.1 K/uL (ref 0.0–0.1)
Basophils Relative: 0.5 % (ref 0.0–3.0)
Eosinophils Absolute: 0.2 K/uL (ref 0.0–0.7)
Eosinophils Relative: 1.4 % (ref 0.0–5.0)
HCT: 42.8 % (ref 36.0–46.0)
Hemoglobin: 14.2 g/dL (ref 12.0–15.0)
Lymphocytes Relative: 19.2 % (ref 12.0–46.0)
Lymphs Abs: 2.4 K/uL (ref 0.7–4.0)
MCHC: 33 g/dL (ref 30.0–36.0)
MCV: 96.7 fl (ref 78.0–100.0)
Monocytes Absolute: 0.9 K/uL (ref 0.1–1.0)
Monocytes Relative: 6.9 % (ref 3.0–12.0)
Neutro Abs: 9.2 K/uL — ABNORMAL HIGH (ref 1.4–7.7)
Neutrophils Relative %: 72 % (ref 43.0–77.0)
Platelets: 195 K/uL (ref 150.0–400.0)
RBC: 4.43 Mil/uL (ref 3.87–5.11)
RDW: 13.2 % (ref 11.5–15.5)
WBC: 12.7 K/uL — ABNORMAL HIGH (ref 4.0–10.5)

## 2024-10-11 LAB — T3, FREE: T3, Free: 2.9 pg/mL (ref 2.3–4.2)

## 2024-10-11 LAB — COMPREHENSIVE METABOLIC PANEL WITH GFR
ALT: 10 U/L (ref 3–35)
AST: 14 U/L (ref 5–37)
Albumin: 4.3 g/dL (ref 3.5–5.2)
Alkaline Phosphatase: 45 U/L (ref 39–117)
BUN: 15 mg/dL (ref 6–23)
CO2: 27 meq/L (ref 19–32)
Calcium: 9.5 mg/dL (ref 8.4–10.5)
Chloride: 104 meq/L (ref 96–112)
Creatinine, Ser: 0.8 mg/dL (ref 0.40–1.20)
GFR: 70.98 mL/min
Glucose, Bld: 110 mg/dL — ABNORMAL HIGH (ref 70–99)
Potassium: 4.2 meq/L (ref 3.5–5.1)
Sodium: 140 meq/L (ref 135–145)
Total Bilirubin: 0.6 mg/dL (ref 0.2–1.2)
Total Protein: 6.7 g/dL (ref 6.0–8.3)

## 2024-10-11 LAB — TSH: TSH: 2.54 u[IU]/mL (ref 0.35–5.50)

## 2024-10-11 LAB — LIPID PANEL
Cholesterol: 139 mg/dL (ref 28–200)
HDL: 58 mg/dL
LDL Cholesterol: 53 mg/dL (ref 10–99)
NonHDL: 80.61
Total CHOL/HDL Ratio: 2
Triglycerides: 139 mg/dL (ref 10.0–149.0)
VLDL: 27.8 mg/dL (ref 0.0–40.0)

## 2024-10-11 LAB — T4, FREE: Free T4: 0.81 ng/dL (ref 0.60–1.60)

## 2024-10-11 LAB — VITAMIN B12: Vitamin B-12: >1500 pg/mL — ABNORMAL HIGH (ref 211–911)

## 2024-10-11 LAB — VITAMIN D 25 HYDROXY (VIT D DEFICIENCY, FRACTURES): VITD: 58.89 ng/mL (ref 30.00–100.00)

## 2024-10-12 ENCOUNTER — Encounter (HOSPITAL_COMMUNITY)

## 2024-10-14 ENCOUNTER — Encounter (HOSPITAL_COMMUNITY)
Admission: RE | Admit: 2024-10-14 | Discharge: 2024-10-14 | Disposition: A | Source: Ambulatory Visit | Attending: Cardiovascular Disease

## 2024-10-14 DIAGNOSIS — I214 Non-ST elevation (NSTEMI) myocardial infarction: Secondary | ICD-10-CM

## 2024-10-14 DIAGNOSIS — Z48812 Encounter for surgical aftercare following surgery on the circulatory system: Secondary | ICD-10-CM | POA: Diagnosis not present

## 2024-10-17 ENCOUNTER — Encounter (HOSPITAL_COMMUNITY)

## 2024-10-18 ENCOUNTER — Ambulatory Visit (HOSPITAL_COMMUNITY)

## 2024-10-18 NOTE — Progress Notes (Signed)
 Cardiac Individual Treatment Plan  Patient Details  Name: Loretta Austin MRN: [id number] Date of Birth: Jan 05, 1946 Referring Provider:   Flowsheet Row INTENSIVE CARDIAC REHAB ORIENT from 09/29/2024 in [facility]  Referring Provider Dr. Jerel Balding MD    Initial Encounter Date:  Flowsheet Row INTENSIVE CARDIAC REHAB ORIENT from 09/29/2024 in [facility], [facility], [facility]  Date 09/29/24    Visit Diagnosis: 08/14/24 NSTEMI (non-ST elevated myocardial infarction) [facility])  Patient's Home Medications on Admission: Current Medications[1]  Past Medical History: Past Medical History:  Diagnosis Date   Allergy    Arthritis    Cholecystolithiasis    Colon polyps    Diverticulosis    Emphysema of lung (HCC) 10/25   Food poisoning    GERD (gastroesophageal reflux disease)    Heart murmur    High blood pressure    Hyperlipidemia    Microscopic colitis    Osteoporosis    PAD (peripheral artery disease) 01/16/2023   Lower extremity arterial Dopplers 02/06/2023: Right-no stenosis, mild-moderate atherosclerosis throughout especially in CFA and calf vessels  ABIs 02/06/2023: Right 0.91, left 1.12   Sjogren's disease    Sleep apnea    wears appliance    Tobacco Use: Tobacco Use History[2]  Labs: Review Flowsheet       Latest Ref Rng & Units 10/11/2024  Labs for ITP Cardiac and Pulmonary Rehab  Cholestrol 28 - 200 mg/dL 860   LDL (calc) 10 - 99 mg/dL 53   HDL-C >60.99 mg/dL 41.99   Trlycerides 89.9 - 149.0 mg/dL 860.9     Capillary Blood Glucose: No results found for: GLUCAP   Exercise Target Goals: Exercise Program Goal: Individual exercise prescription set using results from initial 6 min walk test and THRR while considering  patients activity barriers and safety.   Exercise Prescription Goal: Initial exercise prescription builds to 30-45 minutes a day of aerobic activity,  2-3 days per week.  Home exercise guidelines will be given to patient during program as part of exercise prescription that the participant will acknowledge.  Activity Barriers & Risk Stratification:  Activity Barriers & Cardiac Risk Stratification - 09/29/24 1557       Activity Barriers & Cardiac Risk Stratification   Activity Barriers Balance Concerns;Joint Problems;Arthritis;Back Problems;Neck/Spine Problems    Cardiac Risk Stratification High          6 Minute Walk:  6 Minute Walk     Row Name 09/29/24 1555         6 Minute Walk   Phase Initial     Distance 1330 feet     Walk Time 6 minutes     # of Rest Breaks 0     MPH 2.52     METS 2.88     RPE 12     Perceived Dyspnea  0     VO2 Peak 10.1     Symptoms Yes (comment)     Comments Right hip pain 2/10, right knee pain 1/10 both due to sciatic nerve     Resting HR 64 bpm     Resting BP 134/66     Resting Oxygen Saturation  99 %     Exercise Oxygen Saturation  during 6 min walk 99 %     Max Ex. HR 106 bpm     Max Ex. BP 152/72     2 Minute Post BP 138/72  Oxygen Initial Assessment:   Oxygen Re-Evaluation:   Oxygen Discharge (Final Oxygen Re-Evaluation):   Initial Exercise Prescription:  Initial Exercise Prescription - 09/29/24 1300       Date of Initial Exercise RX and Referring Provider   Date 09/29/24    Referring Provider Dr. Jerel Balding MD    Expected Discharge Date 12/23/24      Recumbant Bike   Level 1    RPM 45    Watts 15    Minutes 15    METs 1.9      NuStep   Level 1    SPM 65    Minutes 15    METs 1.8      Prescription Details   Frequency (times per week) 3    Duration Progress to 30 minutes of continuous aerobic without signs/symptoms of physical distress      Intensity   THRR 40-80% of Max Heartrate 57-114    Ratings of Perceived Exertion 11-13    Perceived Dyspnea 0-4      Progression   Progression Continue progressive overload as per policy without  signs/symptoms or physical distress.      Resistance Training   Training Prescription Yes          Perform Capillary Blood Glucose checks as needed.  Exercise Prescription Changes:   Exercise Prescription Changes     Row Name 10/05/24 1004 10/14/24 1011           Response to Exercise   Blood Pressure (Admit) 142/80 136/60      Blood Pressure (Exercise) 148/72 144/70      Blood Pressure (Exit) 102/64 124/70      Heart Rate (Admit) 71 bpm 70 bpm      Heart Rate (Exercise) 95 bpm 95 bpm      Heart Rate (Exit) 74 bpm 72 bpm      Rating of Perceived Exertion (Exercise) 11 12      Symptoms None None      Comments Off to a good start with exercise. Switched from recumbent stepper to arm ergometer due to sciatic nerve pain. --      Duration Continue with 30 min of aerobic exercise without signs/symptoms of physical distress. Continue with 30 min of aerobic exercise without signs/symptoms of physical distress.      Intensity THRR unchanged THRR unchanged        Progression   Progression Continue to progress workloads to maintain intensity without signs/symptoms of physical distress. Continue to progress workloads to maintain intensity without signs/symptoms of physical distress.      Average METs 1.6 2        Resistance Training   Training Prescription No Yes      Weight Relaxation day. No weights. 2 lbs      Reps -- 10-15      Time -- 5 Minutes        Interval Training   Interval Training -- No        Recumbant Bike   Level 1 1      RPM 76 70      Watts 20 18      Minutes 15 15      METs 1.7 2.4        Arm Ergometer   Level 1 1      Watts 7 10      Minutes 15 15      METs 1.5 1.7         Exercise  Comments:   Exercise Comments     Row Name 10/05/24 1122           Exercise Comments [name] tolerated low intensity exercise well. Unable to use the recumbent stepper due to sciatic nerve pain. Used the arm ergometer instead. Oriented her to the exercise equipment  and stretching routine.          Exercise Goals and Review:   Exercise Goals     Row Name 09/29/24 1350             Exercise Goals   Increase Physical Activity Yes       Intervention Provide advice, education, support and counseling about physical activity/exercise needs.;Develop an individualized exercise prescription for aerobic and resistive training based on initial evaluation findings, risk stratification, comorbidities and participant's personal goals.       Expected Outcomes Short Term: Attend rehab on a regular basis to increase amount of physical activity.;Long Term: Exercising regularly at least 3-5 days a week.;Long Term: Add in home exercise to make exercise part of routine and to increase amount of physical activity.       Increase Strength and Stamina Yes       Intervention Provide advice, education, support and counseling about physical activity/exercise needs.;Develop an individualized exercise prescription for aerobic and resistive training based on initial evaluation findings, risk stratification, comorbidities and participant's personal goals.       Expected Outcomes Short Term: Increase workloads from initial exercise prescription for resistance, speed, and METs.;Short Term: Perform resistance training exercises routinely during rehab and add in resistance training at home;Long Term: Improve cardiorespiratory fitness, muscular endurance and strength as measured by increased METs and functional capacity ( )       Able to understand and use rate of perceived exertion (RPE) scale Yes       Intervention Provide education and explanation on how to use RPE scale       Expected Outcomes Short Term: Able to use RPE daily in rehab to express subjective intensity level;Long Term:  Able to use RPE to guide intensity level when exercising independently       Knowledge and understanding of Target Heart Rate Range (THRR) Yes       Intervention Provide education and explanation of  THRR including how the numbers were predicted and where they are located for reference       Expected Outcomes Short Term: Able to state/look up THRR;Long Term: Able to use THRR to govern intensity when exercising independently;Short Term: Able to use daily as guideline for intensity in rehab       Understanding of Exercise Prescription Yes       Intervention Provide education, explanation, and written materials on patient's individual exercise prescription       Expected Outcomes Short Term: Able to explain program exercise prescription;Long Term: Able to explain home exercise prescription to exercise independently          Exercise Goals Re-Evaluation :  Exercise Goals Re-Evaluation     Row Name 10/05/24 1122             Exercise Goal Re-Evaluation   Exercise Goals Review Increase Physical Activity;Increase Strength and Stamina;Able to understand and use rate of perceived exertion (RPE) scale       Comments Rhoda is able to understand and use the RPE scale appropriately.       Expected Outcomes Progress workloads as tolerated to help improve cardiorespiratory fitness.  Discharge Exercise Prescription (Final Exercise Prescription Changes):  Exercise Prescription Changes - 10/14/24 1011       Response to Exercise   Blood Pressure (Admit) 136/60    Blood Pressure (Exercise) 144/70    Blood Pressure (Exit) 124/70    Heart Rate (Admit) 70 bpm    Heart Rate (Exercise) 95 bpm    Heart Rate (Exit) 72 bpm    Rating of Perceived Exertion (Exercise) 12    Symptoms None    Duration Continue with 30 min of aerobic exercise without signs/symptoms of physical distress.    Intensity THRR unchanged      Progression   Progression Continue to progress workloads to maintain intensity without signs/symptoms of physical distress.    Average METs 2      Resistance Training   Training Prescription Yes    Weight 2 lbs    Reps 10-15    Time 5 Minutes      Interval Training    Interval Training No      Recumbant Bike   Level 1    RPM 70    Watts 18    Minutes 15    METs 2.4      Arm Ergometer   Level 1    Watts 10    Minutes 15    METs 1.7          Nutrition:  Target Goals: Understanding of nutrition guidelines, daily intake of sodium 1500mg , cholesterol 200mg , calories 30% from fat and 7% or less from saturated fats, daily to have 5 or more servings of fruits and vegetables.  Biometrics:  Pre Biometrics - 09/29/24 1558       Pre Biometrics   Height 5' 5 (1.651 m)    Weight 68.7 kg    Waist Circumference 35 inches    Hip Circumference 40.5 inches    Waist to Hip Ratio 0.86 %    BMI (Calculated) 25.2    Triceps Skinfold 16 mm    % Body Fat 35.3 %    Grip Strength 15 kg    Flexibility 12.75 in    Single Leg Stand 3 seconds           Nutrition Therapy Plan and Nutrition Goals:  Nutrition Therapy & Goals - 10/05/24 1600       Nutrition Therapy   Diet Heart Healthy      Personal Nutrition Goals   Nutrition Goal Patient to identify strategies for reducing cardiovascular risk by attending the Pritikin education and nutrition series weekly.    Personal Goal #2 Patient to improve diet quality by using the plate method as a guide for meal planning to include lean protein/plant protein, fruits, vegetables, whole grains, nonfat dairy as part of a well-balanced diet.    Personal Goal #3 Patient to limit sodium intake to < 1500 mg per day.    Comments Pt with medical history significant of hypertension, CAD, PAD, obstructive sleep apnea, collagenous colitis, rheumatoid arthritis, Sjogren's syndrome, hyperlipidemia, s/p NSTEMI on 08/14/2024. BMI within appropriate range of 23-27 kg/m2 for >= 65 years. Pt endorses understanding regarding heart healthy guidelines, though reports difficulty making behavior changes. Reports difficulty preparing meals at home due to lack of energy; trying to find meal delivery service which provides lower sodium  options. RD provided list of suggested meal deliver services which provide low/lower sodium menu options. Also discussed easy-to-prepare foods which may better align with Pritikin guidelines. Discussed appropriate sodium limit based on Pritikin and American Heart  Association guidelines. Patient will benefit from participation in intensive cardiac rehab for nutrition education, exercise, and lifestyle modification.      Intervention Plan   Intervention Prescribe, educate and counsel regarding individualized specific dietary modifications aiming towards targeted core components such as weight, hypertension, lipid management, diabetes, heart failure and other comorbidities.    Expected Outcomes Short Term Goal: Understand basic principles of dietary content, such as calories, fat, sodium, cholesterol and nutrients.;Long Term Goal: Adherence to prescribed nutrition plan.          Nutrition Assessments:  MEDIFICTS Score Key: >=70 Need to make dietary changes  40-70 Heart Healthy Diet <= 40 Therapeutic Level Cholesterol Diet   Flowsheet Row INTENSIVE CARDIAC REHAB ORIENT from 09/29/2024 in [facility], [facility]ascular, & Lung Health  Picture Your Plate Total Score on Admission 64   Picture Your Plate Scores: <59 Unhealthy dietary pattern with much room for improvement. 41-50 Dietary pattern unlikely to meet recommendations for good health and room for improvement. 51-60 More healthful dietary pattern, with some room for improvement.  >60 Healthy dietary pattern, although there may be some specific behaviors that could be improved.    Nutrition Goals Re-Evaluation:   Nutrition Goals Re-Evaluation:   Nutrition Goals Discharge (Final Nutrition Goals Re-Evaluation):   Psychosocial: Target Goals: Acknowledge presence or absence of significant depression and/or stress, maximize coping skills, provide positive support system. Participant is able to verbalize types and  ability to use techniques and skills needed for reducing stress and depression.  Initial Review & Psychosocial Screening:  Initial Psych Review & Screening - 09/29/24 1329       Initial Review   Current issues with Current Depression      Family Dynamics   Good Support System? Yes   Pt has a daughter, bridge group, and a close friend as a part of her support system     Barriers   Psychosocial barriers to participate in program The patient should benefit from training in stress management and relaxation.      Screening Interventions   Interventions Encouraged to exercise;To provide support and resources with identified psychosocial needs;Provide feedback about the scores to participant    Expected Outcomes Long Term Goal: Stressors or current issues are controlled or eliminated.;Short Term goal: Identification and review with participant of any Quality of Life or Depression concerns found by scoring the questionnaire.;Long Term goal: The participant improves quality of Life and PHQ9 Scores as seen by post scores and/or verbalization of changes          Quality of Life Scores:  Quality of Life - 09/29/24 1409       Quality of Life   Select Quality of Life      Quality of Life Scores   Health/Function Pre 23.63 %    Socioeconomic Pre 30 %    Psych/Spiritual Pre 30 %    Family Pre 24 %    GLOBAL Pre 26.15 %         Scores of 19 and below usually indicate a poorer quality of life in these areas.  A difference of  2-3 points is a clinically meaningful difference.  A difference of 2-3 points in the total score of the Quality of Life Index has been associated with significant improvement in overall quality of life, self-image, physical symptoms, and general health in studies assessing change in quality of life.  PHQ-9: Review Flowsheet       10/10/2024 09/29/2024 07/19/2024  Depression  screen PHQ 2/9  Decreased Interest 1 1 0  Down, Depressed, Hopeless 1 1 0  PHQ - 2 Score 2 2  0  Altered sleeping 0 0 -  Tired, decreased energy 1 1 -  Change in appetite 1 1 -  Feeling bad or failure about yourself  0 0 -  Trouble concentrating 0 0 -  Moving slowly or fidgety/restless 0 0 -  Suicidal thoughts 0 0 -  PHQ-9 Score 4 4 -  Difficult doing work/chores Not difficult at all Not difficult at all -   Interpretation of Total Score  Total Score Depression Severity:  1-4 = Minimal depression, 5-9 = Mild depression, 10-14 = Moderate depression, 15-19 = Moderately severe depression, 20-27 = Severe depression   Psychosocial Evaluation and Intervention:   Psychosocial Re-Evaluation:  Psychosocial Re-Evaluation     Row Name 10/06/24 1008 10/18/24 1326           Psychosocial Re-Evaluation   Current issues with Current Depression Current Depression      Comments [name] did not voice any increased concerns or stressors on her first day of exercise. Will review PHQ9 in the upcoming future Will review PHQ9 in the upcoming future. Rhoda has not voiced any increased concerns or stressors during exercise.      Expected Outcomes Rhoda will have controlled or decreased depression upon completion of cardiac rehab. Rhoda will have controlled or decreased depression upon completion of cardiac rehab.      Interventions Stress management education;Encouraged to attend Cardiac Rehabilitation for the exercise;Relaxation education Stress management education;Encouraged to attend Cardiac Rehabilitation for the exercise;Relaxation education      Continue Psychosocial Services  Follow up required by staff Follow up required by staff         Psychosocial Discharge (Final Psychosocial Re-Evaluation):  Psychosocial Re-Evaluation - 10/18/24 1326       Psychosocial Re-Evaluation   Current issues with Current Depression    Comments Will review PHQ9 in the upcoming future. Rhoda has not voiced any increased concerns or stressors during exercise.    Expected Outcomes Rhoda will have controlled or  decreased depression upon completion of cardiac rehab.    Interventions Stress management education;Encouraged to attend Cardiac Rehabilitation for the exercise;Relaxation education    Continue Psychosocial Services  Follow up required by staff          Vocational Rehabilitation: Provide vocational rehab assistance to qualifying candidates.   Vocational Rehab Evaluation & Intervention:  Vocational Rehab - 09/29/24 1428       Initial Vocational Rehab Evaluation & Intervention   Assessment shows need for Vocational Rehabilitation No   Retired         Education: Education Goals: Education classes will be provided on a weekly basis, covering required topics. Participant will state understanding/return demonstration of topics presented.    Education     Row Name 10/05/24 1100     Education   Cardiac Education Topics Pritikin   Customer Service Manager   Weekly Topic Adding Flavor - Sodium-Free   Instruction Review Code 1- Verbalizes Understanding   Class Start Time 1146   Class Stop Time 1220   Class Time Calculation (min) 34 min      Core Videos: Exercise    Move It!  Clinical staff conducted group or individual video education with verbal and written material and guidebook.  Patient learns the recommended Pritikin exercise program. Exercise with the goal of living  a long, healthy life. Some of the health benefits of exercise include controlled diabetes, healthier blood pressure levels, improved cholesterol levels, improved heart and lung capacity, improved sleep, and better body composition. Everyone should speak with their doctor before starting or changing an exercise routine.  Biomechanical Limitations Clinical staff conducted group or individual video education with verbal and written material and guidebook.  Patient learns how biomechanical limitations can impact exercise and how we can mitigate and possibly overcome  limitations to have an impactful and balanced exercise routine.  Body Composition Clinical staff conducted group or individual video education with verbal and written material and guidebook.  Patient learns that body composition (ratio of muscle mass to fat mass) is a key component to assessing overall fitness, rather than body weight alone. Increased fat mass, especially visceral belly fat, can put us  at increased risk for metabolic syndrome, type 2 diabetes, heart disease, and even death. It is recommended to combine diet and exercise (cardiovascular and resistance training) to improve your body composition. Seek guidance from your physician and exercise physiologist before implementing an exercise routine.  Exercise Action Plan Clinical staff conducted group or individual video education with verbal and written material and guidebook.  Patient learns the recommended strategies to achieve and enjoy long-term exercise adherence, including variety, self-motivation, self-efficacy, and positive decision making. Benefits of exercise include fitness, good health, weight management, more energy, better sleep, less stress, and overall well-being.  Medical   Heart Disease Risk Reduction Clinical staff conducted group or individual video education with verbal and written material and guidebook.  Patient learns our heart is our most vital organ as it circulates oxygen, nutrients, white blood cells, and hormones throughout the entire body, and carries waste away. Data supports a plant-based eating plan like the Pritikin Program for its effectiveness in slowing progression of and reversing heart disease. The video provides a number of recommendations to address heart disease.   Metabolic Syndrome and Belly Fat  Clinical staff conducted group or individual video education with verbal and written material and guidebook.  Patient learns what metabolic syndrome is, how it leads to heart disease, and how one can  reverse it and keep it from coming back. You have metabolic syndrome if you have 3 of the following 5 criteria: abdominal obesity, high blood pressure, high triglycerides, low HDL cholesterol, and high blood sugar.  Hypertension and Heart Disease Clinical staff conducted group or individual video education with verbal and written material and guidebook.  Patient learns that high blood pressure, or hypertension, is very common in the [place] . Hypertension is largely due to excessive salt intake, but other important risk factors include being overweight, physical inactivity, drinking too much alcohol , smoking, and not eating enough potassium from fruits and vegetables. High blood pressure is a leading risk factor for heart attack, stroke, congestive heart failure, dementia, kidney failure, and premature death. Long-term effects of excessive salt intake include stiffening of the arteries and thickening of heart muscle and organ damage. Recommendations include ways to reduce hypertension and the risk of heart disease.  Diseases of Our Time - Focusing on Diabetes Clinical staff conducted group or individual video education with verbal and written material and guidebook.  Patient learns why the best way to stop diseases of our time is prevention, through food and other lifestyle changes. Medicine (such as prescription pills and surgeries) is often only a Band-Aid on the problem, not a long-term solution. Most common diseases of our time include obesity, type 2 diabetes,  hypertension, heart disease, and cancer. The Pritikin Program is recommended and has been proven to help reduce, reverse, and/or prevent the damaging effects of metabolic syndrome.  Nutrition   Overview of the Pritikin Eating Plan  Clinical staff conducted group or individual video education with verbal and written material and guidebook.  Patient learns about the Pritikin Eating Plan for disease risk reduction. The Pritikin Eating Plan  emphasizes a wide variety of unrefined, minimally-processed carbohydrates, like fruits, vegetables, whole grains, and legumes. Go, Caution, and Stop food choices are explained. Plant-based and lean animal proteins are emphasized. Rationale provided for low sodium intake for blood pressure control, low added sugars for blood sugar stabilization, and low added fats and oils for coronary artery disease risk reduction and weight management.  Calorie Density  Clinical staff conducted group or individual video education with verbal and written material and guidebook.  Patient learns about calorie density and how it impacts the Pritikin Eating Plan. Knowing the characteristics of the food you choose will help you decide whether those foods will lead to weight gain or weight loss, and whether you want to consume more or less of them. Weight loss is usually a side effect of the Pritikin Eating Plan because of its focus on low calorie-dense foods.  Label Reading  Clinical staff conducted group or individual video education with verbal and written material and guidebook.  Patient learns about the Pritikin recommended label reading guidelines and corresponding recommendations regarding calorie density, added sugars, sodium content, and whole grains.  Dining Out - Part 1  Clinical staff conducted group or individual video education with verbal and written material and guidebook.  Patient learns that restaurant meals can be sabotaging because they can be so high in calories, fat, sodium, and/or sugar. Patient learns recommended strategies on how to positively address this and avoid unhealthy pitfalls.  Facts on Fats  Clinical staff conducted group or individual video education with verbal and written material and guidebook.  Patient learns that lifestyle modifications can be just as effective, if not more so, as many medications for lowering your risk of heart disease. A Pritikin lifestyle can help to reduce your  risk of inflammation and atherosclerosis (cholesterol build-up, or plaque, in the artery walls). Lifestyle interventions such as dietary choices and physical activity address the cause of atherosclerosis. A review of the types of fats and their impact on blood cholesterol levels, along with dietary recommendations to reduce fat intake is also included.  Nutrition Action Plan  Clinical staff conducted group or individual video education with verbal and written material and guidebook.  Patient learns how to incorporate Pritikin recommendations into their lifestyle. Recommendations include planning and keeping personal health goals in mind as an important part of their success.  Healthy Mind-Set    Healthy Minds, Bodies, Hearts  Clinical staff conducted group or individual video education with verbal and written material and guidebook.  Patient learns how to identify when they are stressed. Video will discuss the impact of that stress, as well as the many benefits of stress management. Patient will also be introduced to stress management techniques. The way we think, act, and feel has an impact on our hearts.  How Our Thoughts Can Heal Our Hearts  Clinical staff conducted group or individual video education with verbal and written material and guidebook.  Patient learns that negative thoughts can cause depression and anxiety. This can result in negative lifestyle behavior and serious health problems. Cognitive behavioral therapy is an effective method to  help control our thoughts in order to change and improve our emotional outlook.  Additional Videos:  Exercise    Improving Performance  Clinical staff conducted group or individual video education with verbal and written material and guidebook.  Patient learns to use a non-linear approach by alternating intensity levels and lengths of time spent exercising to help burn more calories and lose more body fat. Cardiovascular exercise helps improve heart  health, metabolism, hormonal balance, blood sugar control, and recovery from fatigue. Resistance training improves strength, endurance, balance, coordination, reaction time, metabolism, and muscle mass. Flexibility exercise improves circulation, posture, and balance. Seek guidance from your physician and exercise physiologist before implementing an exercise routine and learn your capabilities and proper form for all exercise.  Introduction to Yoga  Clinical staff conducted group or individual video education with verbal and written material and guidebook.  Patient learns about yoga, a discipline of the coming together of mind, breath, and body. The benefits of yoga include improved flexibility, improved range of motion, better posture and core strength, increased lung function, weight loss, and positive self-image. Yogas heart health benefits include lowered blood pressure, healthier heart rate, decreased cholesterol and triglyceride levels, improved immune function, and reduced stress. Seek guidance from your physician and exercise physiologist before implementing an exercise routine and learn your capabilities and proper form for all exercise.  Medical   Aging: Enhancing Your Quality of Life  Clinical staff conducted group or individual video education with verbal and written material and guidebook.  Patient learns key strategies and recommendations to stay in good physical health and enhance quality of life, such as prevention strategies, having an advocate, securing a Health Care Proxy and Power of Attorney, and keeping a list of medications and system for tracking them. It also discusses how to avoid risk for bone loss.  Biology of Weight Control  Clinical staff conducted group or individual video education with verbal and written material and guidebook.  Patient learns that weight gain occurs because we consume more calories than we burn (eating more, moving less). Even if your body weight is  normal, you may have higher ratios of fat compared to muscle mass. Too much body fat puts you at increased risk for cardiovascular disease, heart attack, stroke, type 2 diabetes, and obesity-related cancers. In addition to exercise, following the Pritikin Eating Plan can help reduce your risk.  Decoding Lab Results  Clinical staff conducted group or individual video education with verbal and written material and guidebook.  Patient learns that lab test reflects one measurement whose values change over time and are influenced by many factors, including medication, stress, sleep, exercise, food, hydration, pre-existing medical conditions, and more. It is recommended to use the knowledge from this video to become more involved with your lab results and evaluate your numbers to speak with your doctor.   Diseases of Our Time - Overview  Clinical staff conducted group or individual video education with verbal and written material and guidebook.  Patient learns that according to the CDC, 50% to 70% of chronic diseases (such as obesity, type 2 diabetes, elevated lipids, hypertension, and heart disease) are avoidable through lifestyle improvements including healthier food choices, listening to satiety cues, and increased physical activity.  Sleep Disorders Clinical staff conducted group or individual video education with verbal and written material and guidebook.  Patient learns how good quality and duration of sleep are important to overall health and well-being. Patient also learns about sleep disorders and how they impact health along  with recommendations to address them, including discussing with a physician.  Nutrition  Dining Out - Part 2 Clinical staff conducted group or individual video education with verbal and written material and guidebook.  Patient learns how to plan ahead and communicate in order to maximize their dining experience in a healthy and nutritious manner. Included are recommended  food choices based on the type of restaurant the patient is visiting.   Fueling a Banker conducted group or individual video education with verbal and written material and guidebook.  There is a strong connection between our food choices and our health. Diseases like obesity and type 2 diabetes are very prevalent and are in large-part due to lifestyle choices. The Pritikin Eating Plan provides plenty of food and hunger-curbing satisfaction. It is easy to follow, affordable, and helps reduce health risks.  Menu Workshop  Clinical staff conducted group or individual video education with verbal and written material and guidebook.  Patient learns that restaurant meals can sabotage health goals because they are often packed with calories, fat, sodium, and sugar. Recommendations include strategies to plan ahead and to communicate with the manager, chef, or server to help order a healthier meal.  Planning Your Eating Strategy  Clinical staff conducted group or individual video education with verbal and written material and guidebook.  Patient learns about the Pritikin Eating Plan and its benefit of reducing the risk of disease. The Pritikin Eating Plan does not focus on calories. Instead, it emphasizes high-quality, nutrient-rich foods. By knowing the characteristics of the foods, we choose, we can determine their calorie density and make informed decisions.  Targeting Your Nutrition Priorities  Clinical staff conducted group or individual video education with verbal and written material and guidebook.  Patient learns that lifestyle habits have a tremendous impact on disease risk and progression. This video provides eating and physical activity recommendations based on your personal health goals, such as reducing LDL cholesterol, losing weight, preventing or controlling type 2 diabetes, and reducing high blood pressure.  Vitamins and Minerals  Clinical staff conducted group or  individual video education with verbal and written material and guidebook.  Patient learns different ways to obtain key vitamins and minerals, including through a recommended healthy diet. It is important to discuss all supplements you take with your doctor.   Healthy Mind-Set    Smoking Cessation  Clinical staff conducted group or individual video education with verbal and written material and guidebook.  Patient learns that cigarette smoking and tobacco addiction pose a serious health risk which affects millions of people. Stopping smoking will significantly reduce the risk of heart disease, lung disease, and many forms of cancer. Recommended strategies for quitting are covered, including working with your doctor to develop a successful plan.  Culinary   Becoming a Set Designer conducted group or individual video education with verbal and written material and guidebook.  Patient learns that cooking at home can be healthy, cost-effective, quick, and puts them in control. Keys to cooking healthy recipes will include looking at your recipe, assessing your equipment needs, planning ahead, making it simple, choosing cost-effective seasonal ingredients, and limiting the use of added fats, salts, and sugars.  Cooking - Breakfast and Snacks  Clinical staff conducted group or individual video education with verbal and written material and guidebook.  Patient learns how important breakfast is to satiety and nutrition through the entire day. Recommendations include key foods to eat during breakfast to help stabilize blood sugar levels  and to prevent overeating at meals later in the day. Planning ahead is also a key component.  Cooking - Educational Psychologist conducted group or individual video education with verbal and written material and guidebook.  Patient learns eating strategies to improve overall health, including an approach to cook more at home. Recommendations include  thinking of animal protein as a side on your plate rather than center stage and focusing instead on lower calorie dense options like vegetables, fruits, whole grains, and plant-based proteins, such as beans. Making sauces in large quantities to freeze for later and leaving the skin on your vegetables are also recommended to maximize your experience.  Cooking - Healthy Salads and Dressing Clinical staff conducted group or individual video education with verbal and written material and guidebook.  Patient learns that vegetables, fruits, whole grains, and legumes are the foundations of the Pritikin Eating Plan. Recommendations include how to incorporate each of these in flavorful and healthy salads, and how to create homemade salad dressings. Proper handling of ingredients is also covered. Cooking - Soups and [employer] - Soups and Desserts Clinical staff conducted group or individual video education with verbal and written material and guidebook.  Patient learns that Pritikin soups and desserts make for easy, nutritious, and delicious snacks and meal components that are low in sodium, fat, sugar, and calorie density, while high in vitamins, minerals, and filling fiber. Recommendations include simple and healthy ideas for soups and desserts.   Overview     The Pritikin Solution Program Overview Clinical staff conducted group or individual video education with verbal and written material and guidebook.  Patient learns that the results of the Pritikin Program have been documented in more than 100 articles published in peer-reviewed journals, and the benefits include reducing risk factors for (and, in some cases, even reversing) high cholesterol, high blood pressure, type 2 diabetes, obesity, and more! An overview of the three key pillars of the Pritikin Program will be covered: eating well, doing regular exercise, and having a healthy mind-set.  WORKSHOPS  Exercise: Exercise Basics: Building Your  Action Plan Clinical staff led group instruction and group discussion with PowerPoint presentation and patient guidebook. To enhance the learning environment the use of posters, models and videos may be added. At the conclusion of this workshop, patients will comprehend the difference between physical activity and exercise, as well as the benefits of incorporating both, into their routine. Patients will understand the FITT (Frequency, Intensity, Time, and Type) principle and how to use it to build an exercise action plan. In addition, safety concerns and other considerations for exercise and cardiac rehab will be addressed by the presenter. The purpose of this lesson is to promote a comprehensive and effective weekly exercise routine in order to improve patients overall level of fitness.   Managing Heart Disease: Your Path to a Healthier Heart Clinical staff led group instruction and group discussion with PowerPoint presentation and patient guidebook. To enhance the learning environment the use of posters, models and videos may be added.At the conclusion of this workshop, patients will understand the anatomy and physiology of the heart. Additionally, they will understand how Pritikins three pillars impact the risk factors, the progression, and the management of heart disease.  The purpose of this lesson is to provide a high-level overview of the heart, heart disease, and how the Pritikin lifestyle positively impacts risk factors.  Exercise Biomechanics Clinical staff led group instruction and group discussion with PowerPoint presentation and patient  guidebook. To enhance the learning environment the use of posters, models and videos may be added. Patients will learn how the structural parts of their bodies function and how these functions impact their daily activities, movement, and exercise. Patients will learn how to promote a neutral spine, learn how to manage pain, and identify ways to  improve their physical movement in order to promote healthy living. The purpose of this lesson is to expose patients to common physical limitations that impact physical activity. Participants will learn practical ways to adapt and manage aches and pains, and to minimize their effect on regular exercise. Patients will learn how to maintain good posture while sitting, walking, and lifting.  Balance Training and Fall Prevention  Clinical staff led group instruction and group discussion with PowerPoint presentation and patient guidebook. To enhance the learning environment the use of posters, models and videos may be added. At the conclusion of this workshop, patients will understand the importance of their sensorimotor skills (vision, proprioception, and the vestibular system) in maintaining their ability to balance as they age. Patients will apply a variety of balancing exercises that are appropriate for their current level of function. Patients will understand the common causes for poor balance, possible solutions to these problems, and ways to modify their physical environment in order to minimize their fall risk. The purpose of this lesson is to teach patients about the importance of maintaining balance as they age and ways to minimize their risk of falling.  WORKSHOPS   Nutrition:  Fueling a Ship Broker led group instruction and group discussion with PowerPoint presentation and patient guidebook. To enhance the learning environment the use of posters, models and videos may be added. Patients will review the foundational principles of the Pritikin Eating Plan and understand what constitutes a serving size in each of the food groups. Patients will also learn Pritikin-friendly foods that are better choices when away from home and review make-ahead meal and snack options. Calorie density will be reviewed and applied to three nutrition priorities: weight maintenance, weight loss, and  weight gain. The purpose of this lesson is to reinforce (in a group setting) the key concepts around what patients are recommended to eat and how to apply these guidelines when away from home by planning and selecting Pritikin-friendly options. Patients will understand how calorie density may be adjusted for different weight management goals.  Mindful Eating  Clinical staff led group instruction and group discussion with PowerPoint presentation and patient guidebook. To enhance the learning environment the use of posters, models and videos may be added. Patients will briefly review the concepts of the Pritikin Eating Plan and the importance of low-calorie dense foods. The concept of mindful eating will be introduced as well as the importance of paying attention to internal hunger signals. Triggers for non-hunger eating and techniques for dealing with triggers will be explored. The purpose of this lesson is to provide patients with the opportunity to review the basic principles of the Pritikin Eating Plan, discuss the value of eating mindfully and how to measure internal cues of hunger and fullness using the Hunger Scale. Patients will also discuss reasons for non-hunger eating and learn strategies to use for controlling emotional eating.  Targeting Your Nutrition Priorities Clinical staff led group instruction and group discussion with PowerPoint presentation and patient guidebook. To enhance the learning environment the use of posters, models and videos may be added. Patients will learn how to determine their genetic susceptibility to disease by reviewing their  family history. Patients will gain insight into the importance of diet as part of an overall healthy lifestyle in mitigating the impact of genetics and other environmental insults. The purpose of this lesson is to provide patients with the opportunity to assess their personal nutrition priorities by looking at their family history, their own health  history and current risk factors. Patients will also be able to discuss ways of prioritizing and modifying the Pritikin Eating Plan for their highest risk areas  Menu  Clinical staff led group instruction and group discussion with PowerPoint presentation and patient guidebook. To enhance the learning environment the use of posters, models and videos may be added. Using menus brought in from [employer], or printed from [employer], patients will apply the Pritikin dining out guidelines that were presented in the [employer] video. Patients will also be able to practice these guidelines in a variety of provided scenarios. The purpose of this lesson is to provide patients with the opportunity to practice hands-on learning of the Pritikin Dining Out guidelines with actual menus and practice scenarios.  Label Reading Clinical staff led group instruction and group discussion with PowerPoint presentation and patient guidebook. To enhance the learning environment the use of posters, models and videos may be added. Patients will review and discuss the Pritikin label reading guidelines presented in Pritikins Label Reading Educational series video. Using fool labels brought in from local grocery stores and markets, patients will apply the label reading guidelines and determine if the packaged food meet the Pritikin guidelines. The purpose of this lesson is to provide patients with the opportunity to review, discuss, and practice hands-on learning of the Pritikin Label Reading guidelines with actual packaged food labels. Cooking School  Pritikins [employer] are designed to teach patients ways to prepare quick, simple, and affordable recipes at home. The importance of nutritions role in chronic disease risk reduction is reflected in its emphasis in the overall Pritikin program. By learning how to prepare essential core Pritikin Eating Plan recipes, patients will increase  control over what they eat; be able to customize the flavor of foods without the use of added salt, sugar, or fat; and improve the quality of the food they consume. By learning a set of core recipes which are easily assembled, quickly prepared, and affordable, patients are more likely to prepare more healthy foods at home. These workshops focus on convenient breakfasts, simple entres, side dishes, and desserts which can be prepared with minimal effort and are consistent with nutrition recommendations for cardiovascular risk reduction. Cooking [employer] are taught by a armed forces logistics/support/administrative officer (RD) who has been trained by the [employer]. The chef or RD has a clear understanding of the importance of minimizing - if not completely eliminating - added fat, sugar, and sodium in recipes. Throughout the series of Cooking School Workshop sessions, patients will learn about healthy ingredients and efficient methods of cooking to build confidence in their capability to prepare    Cooking School weekly topics:  Adding Flavor- Sodium-Free  Fast and Healthy Breakfasts  Powerhouse Plant-Based Proteins  Satisfying Salads and Dressings  Simple Sides and Sauces  International Cuisine-Spotlight on the [employer] Zones  Delicious Desserts  Savory Soups  [employer] - Meals in a Astronomer Appetizers and Snacks  Comforting Weekend Breakfasts  One-Pot Wonders   Fast Evening Meals  Landscape Architect Your Pritikin Plate  WORKSHOPS   Healthy Mindset (Psychosocial):  Focused Goals, Sustainable  Changes Clinical staff led group instruction and group discussion with PowerPoint presentation and patient guidebook. To enhance the learning environment the use of posters, models and videos may be added. Patients will be able to apply effective goal setting strategies to establish at least one personal goal, and then take consistent, meaningful action toward that goal. They will  learn to identify common barriers to achieving personal goals and develop strategies to overcome them. Patients will also gain an understanding of how our mind-set can impact our ability to achieve goals and the importance of cultivating a positive and growth-oriented mind-set. The purpose of this lesson is to provide patients with a deeper understanding of how to set and achieve personal goals, as well as the tools and strategies needed to overcome common obstacles which may arise along the way.  From Head to Heart: The Power of a Healthy Outlook  Clinical staff led group instruction and group discussion with PowerPoint presentation and patient guidebook. To enhance the learning environment the use of posters, models and videos may be added. Patients will be able to recognize and describe the impact of emotions and mood on physical health. They will discover the importance of self-care and explore self-care practices which may work for them. Patients will also learn how to utilize the 4 Cs to cultivate a healthier outlook and better manage stress and challenges. The purpose of this lesson is to demonstrate to patients how a healthy outlook is an essential part of maintaining good health, especially as they continue their cardiac rehab journey.  Healthy Sleep for a Healthy Heart Clinical staff led group instruction and group discussion with PowerPoint presentation and patient guidebook. To enhance the learning environment the use of posters, models and videos may be added. At the conclusion of this workshop, patients will be able to demonstrate knowledge of the importance of sleep to overall health, well-being, and quality of life. They will understand the symptoms of, and treatments for, common sleep disorders. Patients will also be able to identify daytime and nighttime behaviors which impact sleep, and they will be able to apply these tools to help manage sleep-related challenges. The purpose of this  lesson is to provide patients with a general overview of sleep and outline the importance of quality sleep. Patients will learn about a few of the most common sleep disorders. Patients will also be introduced to the concept of sleep hygiene, and discover ways to self-manage certain sleeping problems through simple daily behavior changes. Finally, the workshop will motivate patients by clarifying the links between quality sleep and their goals of heart-healthy living.   Recognizing and Reducing Stress Clinical staff led group instruction and group discussion with PowerPoint presentation and patient guidebook. To enhance the learning environment the use of posters, models and videos may be added. At the conclusion of this workshop, patients will be able to understand the types of stress reactions, differentiate between acute and chronic stress, and recognize the impact that chronic stress has on their health. They will also be able to apply different coping mechanisms, such as reframing negative self-talk. Patients will have the opportunity to practice a variety of stress management techniques, such as deep abdominal breathing, progressive muscle relaxation, and/or guided imagery.  The purpose of this lesson is to educate patients on the role of stress in their lives and to provide healthy techniques for coping with it.  Learning Barriers/Preferences:  Learning Barriers/Preferences - 09/29/24 1559       Learning Barriers/Preferences   Learning  Barriers Exercise Concerns   Poor balance   Learning Preferences Audio;Computer/Internet;Group Instruction;Pictoral;Individual Instruction;Skilled Demonstration;Verbal Instruction;Video;Written Material          Education Topics:  Knowledge Questionnaire Score:  Knowledge Questionnaire Score - 09/29/24 1403       Knowledge Questionnaire Score   Pre Score 21/24          Core Components/Risk Factors/Patient Goals at Admission:  Personal Goals and  Risk Factors at Admission - 09/29/24 1354       Core Components/Risk Factors/Patient Goals on Admission    Weight Management Yes;Weight Maintenance    Intervention Weight Management: Develop a combined nutrition and exercise program designed to reach desired caloric intake, while maintaining appropriate intake of nutrient and fiber, sodium and fats, and appropriate energy expenditure required for the weight goal.;Weight Management: Provide education and appropriate resources to help participant work on and attain dietary goals.    Admit Weight 151 lb 7.3 oz (68.7 kg)    Hypertension Yes    Intervention Provide education on lifestyle modifcations including regular physical activity/exercise, weight management, moderate sodium restriction and increased consumption of fresh fruit, vegetables, and low fat dairy, alcohol  moderation, and smoking cessation.;Monitor prescription use compliance.    Expected Outcomes Short Term: Continued assessment and intervention until BP is < 140/107mm HG in hypertensive participants. < 130/19mm HG in hypertensive participants with diabetes, heart failure or chronic kidney disease.;Long Term: Maintenance of blood pressure at goal levels.    Lipids Yes    Intervention Provide education and support for participant on nutrition & aerobic/resistive exercise along with prescribed medications to achieve LDL 70mg , HDL >40mg .    Expected Outcomes Short Term: Participant states understanding of desired cholesterol values and is compliant with medications prescribed. Participant is following exercise prescription and nutrition guidelines.;Long Term: Cholesterol controlled with medications as prescribed, with individualized exercise RX and with personalized nutrition plan. Value goals: LDL < 70mg , HDL > 40 mg.    Personal Goal Other Yes    Personal Goal ST and LT the same: get into exercise routine    Intervention Will continue to monitor pt and progress workloads as tolerated without  sign or symptom    Expected Outcomes Pt will achieve her goals          Core Components/Risk Factors/Patient Goals Review:   Goals and Risk Factor Review     Row Name 10/06/24 1013 10/18/24 1329           Core Components/Risk Factors/Patient Goals Review   Personal Goals Review Weight Management/Obesity;Hypertension;Lipids Weight Management/Obesity;Hypertension;Lipids      Review [name] started cardiac rehab on 10/05/24. Rhoda did well with exercise for her fitness level [name] started cardiac rehab on 10/05/24. Rhoda is off to a good start to exercise for her fitness level. Vital signs have been stable.      Expected Outcomes Rhoda will continue to participate in cardiac rehab for exercise, nutrition andlifestyle modifications Rhoda will continue to participate in cardiac rehab for exercise, nutrition and lifestyle modifications         Core Components/Risk Factors/Patient Goals at Discharge (Final Review):   Goals and Risk Factor Review - 10/18/24 1329       Core Components/Risk Factors/Patient Goals Review   Personal Goals Review Weight Management/Obesity;Hypertension;Lipids    Review [name] started cardiac rehab on 10/05/24. Rhoda is off to a good start to exercise for her fitness level. Vital signs have been stable.    Expected Outcomes Rhoda will continue to participate in cardiac rehab  for exercise, nutrition and lifestyle modifications          ITP Comments:  ITP Comments     Row Name 09/29/24 1350 10/05/24 1122 10/18/24 1325       ITP Comments Dr. Wilbert Bihari medical director. Introduction to pritikin education/intensive cardiac rehab. Initial orientation packet reviewed with patient. [name] started exercise in the cardiac rehab program today and did well. Modified exercise prescription to accomodate sciatic nerve pain. Will continue to adjust exercise prescription as necessary. 30 Day ITP Review. [name] started cardiac rehab on 10/05/24 and is off to a good start to  exercise for her fitness level/        Comments: See ITP Comments    [1]  Current Outpatient Medications:    acetaminophen  (TYLENOL ) 325 MG tablet, Take 325 mg by mouth in the morning and at bedtime., Disp: , Rfl:    adalimumab (HUMIRA, 2 PEN,) 40 MG/0.4ML pen, Inject 40 mg into the skin every 14 (fourteen) days. Saturday's (Patient not taking: Reported on 10/10/2024), Disp: , Rfl:    Adalimumab-bwwd (HADLIMA PUSHTOUCH) 40 MG/0.4ML SOAJ, Inject 40 mg into the skin every 14 (fourteen) days., Disp: , Rfl:    apixaban  (ELIQUIS ) 5 MG TABS tablet, Take 1 tablet (5 mg total) by mouth 2 (two) times daily., Disp: 180 tablet, Rfl: 3   Bacillus Coagulans-Inulin (ALIGN PREBIOTIC-PROBIOTIC) 5-1.25 MG-GM CHEW, Chew 1 Dose by mouth daily., Disp: , Rfl:    Bone Tissue Allograft, Human, (OSTEOCONDUCTIVE MATRIX PLUS IJ), Inject as directed., Disp: , Rfl:    budesonide  (ENTOCORT EC ) 3 MG 24 hr capsule, Take 3 capsules (9 mg total) by mouth daily. (Patient taking differently: Take 3 mg by mouth daily.), Disp: 90 capsule, Rfl: 3   Cholecalciferol (VITAMIN D3) 1.25 MG (50000 UT) CAPS, Take 1 capsule by mouth once a week., Disp: , Rfl:    Cyanocobalamin  (VITAMIN B-12 PO), Take 2.5 mg by mouth daily., Disp: , Rfl:    folic acid  (FOLVITE ) 400 MCG tablet, Take 400 mcg by mouth daily., Disp: , Rfl:    HUMIRA, 2 SYRINGE, 40 MG/0.4ML prefilled syringe, Inject into the skin., Disp: , Rfl:    irbesartan  (AVAPRO ) 300 MG tablet, Take 1 tablet (300 mg total) by mouth daily., Disp: 90 tablet, Rfl: 3   Lifitegrast (XIIDRA OP), Apply 0.2 mLs to eye 2 (two) times daily., Disp: , Rfl:    Magnesium 400 MG TABS, Take 1 tablet by mouth daily in the afternoon., Disp: , Rfl:    melatonin 5 MG TABS, Take 5 mg by mouth., Disp: , Rfl:    metoprolol  succinate (TOPROL -XL) 25 MG 24 hr tablet, Take 1 tablet (25 mg total) by mouth 2 (two) times daily., Disp: 180 tablet, Rfl: 3   Multiple Vitamins-Iron (MULTIVITAMIN/IRON PO), Take 1 tablet by  mouth daily., Disp: , Rfl:    nitrofurantoin (MACRODANTIN) 100 MG capsule, Take 100 mg by mouth in the morning., Disp: , Rfl:    rosuvastatin  (CRESTOR ) 10 MG tablet, Take 1 tablet (10 mg total) by mouth daily., Disp: 90 tablet, Rfl: 3   Turmeric (QC TUMERIC COMPLEX) 500 MG CAPS, Take 500 mg by mouth daily., Disp: , Rfl:  [2]  Social History Tobacco Use  Smoking Status Former   Current packs/day: 0.00   Average packs/day: 3.0 packs/day for 20.0 years (60.0 ttl pk-yrs)   Types: Cigarettes   Quit date: 09/23/1983   Years since quitting: 41.0  Smokeless Tobacco Never

## 2024-10-19 ENCOUNTER — Encounter (HOSPITAL_COMMUNITY)

## 2024-10-20 ENCOUNTER — Other Ambulatory Visit: Payer: Self-pay

## 2024-10-20 ENCOUNTER — Encounter (HOSPITAL_BASED_OUTPATIENT_CLINIC_OR_DEPARTMENT_OTHER): Payer: Self-pay | Admitting: Physical Therapy

## 2024-10-20 ENCOUNTER — Ambulatory Visit (HOSPITAL_BASED_OUTPATIENT_CLINIC_OR_DEPARTMENT_OTHER): Attending: Internal Medicine | Admitting: Physical Therapy

## 2024-10-20 DIAGNOSIS — M6281 Muscle weakness (generalized): Secondary | ICD-10-CM

## 2024-10-20 DIAGNOSIS — M5459 Other low back pain: Secondary | ICD-10-CM

## 2024-10-20 DIAGNOSIS — R29898 Other symptoms and signs involving the musculoskeletal system: Secondary | ICD-10-CM

## 2024-10-20 DIAGNOSIS — M25551 Pain in right hip: Secondary | ICD-10-CM

## 2024-10-20 DIAGNOSIS — R2689 Other abnormalities of gait and mobility: Secondary | ICD-10-CM

## 2024-10-20 NOTE — Therapy (Signed)
 " OUTPATIENT PHYSICAL THERAPY THORACOLUMBAR EVALUATION   Patient Name: Loretta Austin MRN: [id number] DOB:1946/05/20, 79 y.o., female Today's Date: 10/20/2024  END OF SESSION:  PT End of Session - 10/20/24 1301     Visit Number 1    Number of Visits 16    Date for Recertification  12/15/24    Authorization Type Medicare    Progress Note Due on Visit 10    PT Start Time 1301    PT Stop Time 1343    PT Time Calculation (min) 42 min    Activity Tolerance Patient tolerated treatment well    Behavior During Therapy [facility] for tasks assessed/performed          Past Medical History:  Diagnosis Date   Allergy    Arthritis    Cholecystolithiasis    Colon polyps    Diverticulosis    Emphysema of lung (HCC) 10/25   Food poisoning    GERD (gastroesophageal reflux disease)    Heart murmur    High blood pressure    Hyperlipidemia    Microscopic colitis    Osteoporosis    PAD (peripheral artery disease) 01/16/2023   Lower extremity arterial Dopplers 02/06/2023: Right-no stenosis, mild-moderate atherosclerosis throughout especially in CFA and calf vessels  ABIs 02/06/2023: Right 0.91, left 1.12   Sjogren's disease    Sleep apnea    wears appliance   Past Surgical History:  Procedure Laterality Date   COLONOSCOPY  04/19/2021   02/12/16-at [place]   CORONARY PRESSURE/FFR STUDY N/A 08/15/2024   Procedure: CORONARY PRESSURE/FFR STUDY;  Surgeon: Elmira Newman PARAS, MD;  Location: [facility] INVASIVE [facility];  Service: Cardiovascular;  Laterality: N/A;   LEFT HEART CATH AND CORONARY ANGIOGRAPHY N/A 08/15/2024   Procedure: LEFT HEART CATH AND CORONARY ANGIOGRAPHY;  Surgeon: Elmira Newman PARAS, MD;  Location: [facility] INVASIVE CV [facility];  Service: Cardiovascular;  Laterality: N/A;   TONSILLECTOMY AND ADENOIDECTOMY     TUBAL LIGATION     Patient Active Problem List   Diagnosis Date Noted   Elevated troponin 08/15/2024   Nonischemic cardiomyopathy (HCC) 08/15/2024   Paroxysmal atrial flutter (HCC)  08/15/2024   Non-ST elevation (NSTEMI) myocardial infarction (HCC) 08/14/2024   Chest pain 08/12/2024   Neutrophilia 07/19/2024   PAD (peripheral artery disease) 01/16/2023   Preoperative cardiovascular examination 01/16/2023   Hyperlipidemia LDL goal <70 01/16/2023   Coronary artery disease, non-occlusive 05/30/2020   Hyponatremia 03/08/2020   UTI (urinary tract infection) 03/08/2020   Microscopic colitis    Hypokalemia    Renal insufficiency    Combined abdominal pain, vomiting, and diarrhea    Hypertension    Rheumatoid arthritis (HCC)    Obstructive sleep apnea 04/25/2018   Multiple lung nodules 04/25/2018   Cough 04/25/2018    PCP: Billy Philippe SAUNDERS, NP   REFERRING PROVIDER: Yolande Toribio MATSU, MD  REFERRING DIAG: M54.50 (ICD-10-CM) - Low back pain, unspecified  Rationale for Evaluation and Treatment: Rehabilitation  THERAPY DIAG:  Pain in right hip  Other low back pain  Muscle weakness (generalized)  Other abnormalities of gait and mobility  Other symptoms and signs involving the musculoskeletal system  ONSET DATE: 07/2024  SUBJECTIVE:  SUBJECTIVE STATEMENT: Patient states sciatica symptoms from R hip to foot. Symptoms worse with sitting in the morning. Pain began after hospital admission in 07/2024. Symptoms improve with mobility as day goes on. Has tried heat and ice with minimal improvement.   PERTINENT HISTORY:  Emphysema, HTN, HLD, PAD, RA NSTEMI 07/2024  PAIN:  Are you having pain? Yes: NPRS scale: 1/10 current; 8/10 worst Pain location: R hip Pain description: deep pain, sharp pain, dull ache Aggravating factors: morning Relieving factors: movement  PRECAUTIONS: None  WEIGHT BEARING RESTRICTIONS: No  FALLS:  Has patient fallen in last 6 months? No  PLOF:  Independent  PATIENT GOALS: get rid of the pain  OBJECTIVE: (objective measures from initial evaluation unless otherwise dated)  PATIENT SURVEYS:  LEFS  Extreme difficulty/unable (0), Quite a bit of difficulty (1), Moderate difficulty (2), Little difficulty (3), No difficulty (4) Survey date:  10/20/24  Any of your usual work, housework or school activities 2  2. Usual hobbies, recreational or sporting activities 2  3. Getting into/out of the bath 2  4. Walking between rooms 2  5. Putting on socks/shoes 2  6. Squatting  0  7. Lifting an object, like a bag of groceries from the floor 0  8. Performing light activities around your home 2  9. Performing heavy activities around your home 2  10. Getting into/out of a car 4  11. Walking 2 blocks 4  12. Walking 1 mile 3  13. Going up/down 10 stairs (1 flight) 2  14. Standing for 1 hour 0  15.  sitting for 1 hour 1  16. Running on even ground 1  17. Running on uneven ground 1  18. Making sharp turns while running fast 1  19. Hopping  1  20. Rolling over in bed 2  Score total:  34/80     COGNITION: Overall cognitive status: Within functional limits for tasks assessed     SENSATION: WFL  POSTURE: rounded shoulders, forward head, and decreased lumbar lordosis  PALPATION: TTP R glute max and piriformis  LUMBAR ROM:   AROM eval  Flexion 0% limited*  Extension 75% limited*  Right lateral flexion 25% limited *  Left lateral flexion 0% limited  Right rotation   Left rotation    (Blank rows = not tested) * = pain/symptoms  LOWER EXTREMITY ROM:   WFL for tasks assessed  Active  Right eval Left eval  Hip flexion    Hip extension    Hip abduction    Hip adduction    Hip internal rotation    Hip external rotation    Knee flexion    Knee extension    Ankle dorsiflexion    Ankle plantarflexion    Ankle inversion    Ankle eversion     (Blank rows = not tested) * = pain/symptoms  LOWER EXTREMITY MMT:    MMT  Right eval Left eval  Hip flexion 4 4+  Hip extension 4- 4-  Hip abduction 4- 4  Hip adduction    Hip internal rotation    Hip external rotation    Knee flexion 5 5  Knee extension 5 5  Ankle dorsiflexion 5 5  Ankle plantarflexion    Ankle inversion    Ankle eversion     (Blank rows = not tested) * = pain/symptoms   FUNCTIONAL TESTS:  5 times sit to stand: 9.82 seconds without UE use, Relies slightly LLE>RLE  GAIT: Distance walked: 100 feet Assistive device utilized:  None Level of assistance: Complete Independence Comments: compensated trendelenberg on R  TODAY'S TREATMENT:                                                                                                                              DATE:  10/20/24 Supine piriformis stretch  Bridge Clam    PATIENT EDUCATION:  Education details: Patient educated on exam findings, POC, scope of PT, HEP, relevant anatomy and biomechanics. Person educated: Patient Education method: Explanation, Demonstration, and Handouts Education comprehension: verbalized understanding, returned demonstration, verbal cues required, and tactile cues required  HOME EXERCISE PROGRAM: Access Code: [email or web address] URL: [email or web address] Date: 10/20/2024 Prepared by: Prentice Laiya Wisby  Exercises - Supine Piriformis Stretch with Foot on Ground  - 2-3 x daily - 7 x weekly - 3 reps - 20 second hold - Supine Bridge  - 2-3 x daily - 7 x weekly - 2 sets - 10 reps - Clamshell (Mirrored)  - 2-3 x daily - 7 x weekly - 2 sets - 10 reps  ASSESSMENT:  CLINICAL IMPRESSION: Patient a 79 y.o. y.o. female who was seen today for physical therapy evaluation and treatment for R hip pain. Patient presents with pain limited deficits in lumbar spine and LE strength, ROM, endurance, activity tolerance, and functional mobility with ADL. Patient is having to modify and restrict ADL as indicated by outcome measure score as well as subjective information  and objective measures which is affecting overall participation. Patient will benefit from skilled physical therapy in order to improve function and reduce impairment.  OBJECTIVE IMPAIRMENTS: Abnormal gait, decreased activity tolerance, decreased balance, decreased endurance, decreased mobility, difficulty walking, decreased ROM, decreased strength, hypomobility, increased muscle spasms, impaired flexibility, improper body mechanics, postural dysfunction, and pain  ACTIVITY LIMITATIONS: carrying, lifting, bending, standing, squatting, stairs, transfers, bed mobility, reach over head, locomotion level, and caring for others  PARTICIPATION LIMITATIONS:  meal prep, cleaning, laundry, shopping, community activity, occupation, and yard work  PERSONAL FACTORS: Time since onset of injury/illness/exacerbation and 3+ comorbidities: Emphysema, HTN, HLD, PAD, RA, NSTEMI 07/2024 are also affecting patient's functional outcome.   REHAB POTENTIAL: Good  CLINICAL DECISION MAKING: Stable/uncomplicated  EVALUATION COMPLEXITY: Low   GOALS: Goals reviewed with patient? Yes  SHORT TERM GOALS: Target date: 11/17/2024    Patient will be independent with HEP in order to improve functional outcomes. Baseline:  Goal status: INITIAL  2.  Patient will report at least 25% improvement in symptoms for improved quality of life. Baseline:  Goal status: INITIAL    LONG TERM GOALS: Target date: 12/15/2024    Patient will report at least 75% improvement in symptoms for improved quality of life. Baseline:  Goal status: INITIAL  2.  Patient will improve LEFS score by at least 18 points in order to indicate improved tolerance to activity. Baseline:  Goal status: INITIAL  3.  Patient will demonstrate grade of 5/5 MMT grade in all tested musculature as evidence of improved strength to  assist with stair ambulation and gait.   Baseline:  Goal status: INITIAL  4.  Patient report centralized symptoms no greater  than 2/10 for improved ability to exercise and walk her dog.  Baseline:  Goal status: INITIAL     PLAN:  PT FREQUENCY: 1-2x/week  PT DURATION: 8 weeks  PLANNED INTERVENTIONS: [id number]- PT Re-evaluation, 97110-Therapeutic exercises, 97530- Therapeutic activity, [id number]- Neuromuscular re-education, 97535- Self Care, [id number]- Manual therapy, [id number]- Gait training, [phone]- Orthotic Fit/training, [phone]- Canalith repositioning, [id number]- Aquatic Therapy, [phone]- Splinting, [phone]- Wound care (first 20 sq cm), 97598- Wound care (each additional 20 sq cm)Patient/Family education, Balance training, Stair training, Taping, Dry Needling, Joint mobilization, Joint manipulation, Spinal manipulation, Spinal mobilization, Scar mobilization, and DME instructions.  PLAN FOR NEXT SESSION: possibly manual for pain/mobility, glute strength, functional strength, hip mobility   Prentice GORMAN Stains, PT, DPT 10/20/2024, 1:50 PM  "

## 2024-10-21 ENCOUNTER — Encounter (HOSPITAL_COMMUNITY)
Admission: RE | Admit: 2024-10-21 | Discharge: 2024-10-21 | Disposition: A | Source: Ambulatory Visit | Attending: Cardiovascular Disease

## 2024-10-21 DIAGNOSIS — Z48812 Encounter for surgical aftercare following surgery on the circulatory system: Secondary | ICD-10-CM | POA: Diagnosis not present

## 2024-10-21 DIAGNOSIS — I214 Non-ST elevation (NSTEMI) myocardial infarction: Secondary | ICD-10-CM

## 2024-10-24 ENCOUNTER — Encounter (HOSPITAL_COMMUNITY)

## 2024-10-26 ENCOUNTER — Ambulatory Visit (HOSPITAL_COMMUNITY)
Admission: RE | Admit: 2024-10-26 | Discharge: 2024-10-26 | Disposition: A | Source: Ambulatory Visit | Attending: Cardiovascular Disease | Admitting: Cardiovascular Disease

## 2024-10-26 ENCOUNTER — Telehealth (HOSPITAL_COMMUNITY): Payer: Self-pay

## 2024-10-26 ENCOUNTER — Encounter (HOSPITAL_COMMUNITY)

## 2024-10-26 DIAGNOSIS — I428 Other cardiomyopathies: Secondary | ICD-10-CM

## 2024-10-26 DIAGNOSIS — I214 Non-ST elevation (NSTEMI) myocardial infarction: Secondary | ICD-10-CM

## 2024-10-26 DIAGNOSIS — I519 Heart disease, unspecified: Secondary | ICD-10-CM

## 2024-10-26 NOTE — Telephone Encounter (Signed)
 Patient c/o for 10:15 CR class, has dr appt.

## 2024-10-27 ENCOUNTER — Telehealth (HOSPITAL_BASED_OUTPATIENT_CLINIC_OR_DEPARTMENT_OTHER): Payer: Self-pay | Admitting: Physical Therapy

## 2024-10-27 ENCOUNTER — Ambulatory Visit (HOSPITAL_BASED_OUTPATIENT_CLINIC_OR_DEPARTMENT_OTHER): Attending: Internal Medicine | Admitting: Physical Therapy

## 2024-10-27 ENCOUNTER — Ambulatory Visit (HOSPITAL_COMMUNITY)
Admission: RE | Admit: 2024-10-27 | Discharge: 2024-10-27 | Attending: Cardiovascular Disease | Admitting: Cardiovascular Disease

## 2024-10-27 NOTE — Telephone Encounter (Signed)
 Patient no show, spoke with patient who stated she had marked down appointment at 1:45 vs 11:45. Offered patient opening tomorrow but she was unable to attend due to other commitments.   12:13 PM, 10/27/24 Prentice CANDIE Stains PT, DPT Physical Therapist at [facility]

## 2024-10-28 ENCOUNTER — Encounter (HOSPITAL_COMMUNITY): Admission: RE | Admit: 2024-10-28

## 2024-10-28 DIAGNOSIS — I214 Non-ST elevation (NSTEMI) myocardial infarction: Secondary | ICD-10-CM

## 2024-10-28 LAB — ECHOCARDIOGRAM LIMITED
Area-P 1/2: 2.8 cm2
S' Lateral: 2.8 cm

## 2024-10-31 ENCOUNTER — Encounter (HOSPITAL_COMMUNITY)

## 2024-11-02 ENCOUNTER — Encounter (HOSPITAL_COMMUNITY)

## 2024-11-03 ENCOUNTER — Encounter (HOSPITAL_BASED_OUTPATIENT_CLINIC_OR_DEPARTMENT_OTHER): Admitting: Physical Therapy

## 2024-11-04 ENCOUNTER — Encounter (HOSPITAL_COMMUNITY)

## 2024-11-07 ENCOUNTER — Encounter (HOSPITAL_COMMUNITY)

## 2024-11-09 ENCOUNTER — Encounter (HOSPITAL_COMMUNITY)

## 2024-11-10 ENCOUNTER — Encounter (HOSPITAL_BASED_OUTPATIENT_CLINIC_OR_DEPARTMENT_OTHER): Admitting: Physical Therapy

## 2024-11-11 ENCOUNTER — Ambulatory Visit (HOSPITAL_COMMUNITY)

## 2024-11-11 ENCOUNTER — Encounter (HOSPITAL_COMMUNITY)

## 2024-11-14 ENCOUNTER — Encounter (HOSPITAL_COMMUNITY)

## 2024-11-15 ENCOUNTER — Ambulatory Visit: Admitting: Physician Assistant

## 2024-11-16 ENCOUNTER — Encounter (HOSPITAL_COMMUNITY)

## 2024-11-18 ENCOUNTER — Encounter (HOSPITAL_COMMUNITY)

## 2024-11-21 ENCOUNTER — Encounter (HOSPITAL_COMMUNITY)

## 2024-11-23 ENCOUNTER — Encounter (HOSPITAL_COMMUNITY)

## 2024-11-25 ENCOUNTER — Encounter (HOSPITAL_COMMUNITY)

## 2024-11-28 ENCOUNTER — Encounter (HOSPITAL_COMMUNITY)

## 2024-11-30 ENCOUNTER — Encounter (HOSPITAL_COMMUNITY)

## 2024-12-02 ENCOUNTER — Encounter (HOSPITAL_COMMUNITY)

## 2024-12-05 ENCOUNTER — Encounter (HOSPITAL_COMMUNITY)

## 2024-12-07 ENCOUNTER — Encounter (HOSPITAL_COMMUNITY)

## 2024-12-09 ENCOUNTER — Encounter (HOSPITAL_COMMUNITY)

## 2024-12-12 ENCOUNTER — Encounter (HOSPITAL_COMMUNITY)

## 2024-12-14 ENCOUNTER — Encounter (HOSPITAL_COMMUNITY)

## 2024-12-16 ENCOUNTER — Encounter (HOSPITAL_COMMUNITY)

## 2024-12-19 ENCOUNTER — Encounter (HOSPITAL_COMMUNITY)

## 2024-12-21 ENCOUNTER — Encounter (HOSPITAL_COMMUNITY)

## 2024-12-23 ENCOUNTER — Encounter (HOSPITAL_COMMUNITY)

## 2025-01-17 ENCOUNTER — Ambulatory Visit: Admitting: Family Medicine

## 2025-03-03 ENCOUNTER — Ambulatory Visit: Admitting: Cardiovascular Disease
